# Patient Record
Sex: Female | Born: 1965 | Race: White | Hispanic: No | Smoking: Current every day smoker
Health system: Southern US, Community
[De-identification: ages and names within clinical notes are randomized; demographics above are authoritative.]

## PROBLEM LIST (undated history)

## (undated) DIAGNOSIS — R0789 Other chest pain: Secondary | ICD-10-CM

## (undated) DIAGNOSIS — F319 Bipolar disorder, unspecified: Secondary | ICD-10-CM

## (undated) DIAGNOSIS — M549 Dorsalgia, unspecified: Secondary | ICD-10-CM

## (undated) DIAGNOSIS — F29 Unspecified psychosis not due to a substance or known physiological condition: Secondary | ICD-10-CM

## (undated) DIAGNOSIS — I509 Heart failure, unspecified: Secondary | ICD-10-CM

## (undated) DIAGNOSIS — M797 Fibromyalgia: Secondary | ICD-10-CM

## (undated) DIAGNOSIS — F410 Panic disorder [episodic paroxysmal anxiety] without agoraphobia: Secondary | ICD-10-CM

## (undated) DIAGNOSIS — F419 Anxiety disorder, unspecified: Secondary | ICD-10-CM

## (undated) DIAGNOSIS — I10 Essential (primary) hypertension: Secondary | ICD-10-CM

## (undated) DIAGNOSIS — M25511 Pain in right shoulder: Secondary | ICD-10-CM

## (undated) DIAGNOSIS — G8929 Other chronic pain: Secondary | ICD-10-CM

## (undated) DIAGNOSIS — F259 Schizoaffective disorder, unspecified: Secondary | ICD-10-CM

## (undated) HISTORY — PX: MANDIBLE SURGERY: SHX707

## (undated) HISTORY — PX: CHOLECYSTECTOMY: SHX55

---

## 2000-07-27 ENCOUNTER — Inpatient Hospital Stay (HOSPITAL_COMMUNITY): Admission: EM | Admit: 2000-07-27 | Discharge: 2000-07-31 | Payer: Self-pay | Admitting: Psychiatry

## 2003-01-02 ENCOUNTER — Emergency Department (HOSPITAL_COMMUNITY): Admission: EM | Admit: 2003-01-02 | Discharge: 2003-01-03 | Payer: Self-pay | Admitting: Emergency Medicine

## 2003-01-19 ENCOUNTER — Ambulatory Visit (HOSPITAL_COMMUNITY): Admission: RE | Admit: 2003-01-19 | Discharge: 2003-01-19 | Payer: Self-pay | Admitting: *Deleted

## 2003-01-19 ENCOUNTER — Encounter: Payer: Self-pay | Admitting: *Deleted

## 2003-03-12 ENCOUNTER — Encounter: Payer: Self-pay | Admitting: Emergency Medicine

## 2003-03-12 ENCOUNTER — Emergency Department (HOSPITAL_COMMUNITY): Admission: EM | Admit: 2003-03-12 | Discharge: 2003-03-12 | Payer: Self-pay | Admitting: *Deleted

## 2003-03-20 ENCOUNTER — Ambulatory Visit (HOSPITAL_COMMUNITY): Admission: RE | Admit: 2003-03-20 | Discharge: 2003-03-20 | Payer: Self-pay | Admitting: Internal Medicine

## 2003-03-20 ENCOUNTER — Encounter: Payer: Self-pay | Admitting: Internal Medicine

## 2003-03-22 ENCOUNTER — Emergency Department (HOSPITAL_COMMUNITY): Admission: EM | Admit: 2003-03-22 | Discharge: 2003-03-22 | Payer: Self-pay | Admitting: Emergency Medicine

## 2003-04-18 ENCOUNTER — Encounter (INDEPENDENT_AMBULATORY_CARE_PROVIDER_SITE_OTHER): Payer: Self-pay | Admitting: Internal Medicine

## 2003-04-18 ENCOUNTER — Ambulatory Visit (HOSPITAL_COMMUNITY): Admission: RE | Admit: 2003-04-18 | Discharge: 2003-04-18 | Payer: Self-pay | Admitting: Internal Medicine

## 2003-05-15 ENCOUNTER — Encounter (HOSPITAL_COMMUNITY): Admission: RE | Admit: 2003-05-15 | Discharge: 2003-06-14 | Payer: Self-pay | Admitting: Oncology

## 2003-05-15 ENCOUNTER — Encounter: Admission: RE | Admit: 2003-05-15 | Discharge: 2003-05-15 | Payer: Self-pay | Admitting: Oncology

## 2003-08-30 ENCOUNTER — Ambulatory Visit (HOSPITAL_COMMUNITY): Admission: RE | Admit: 2003-08-30 | Discharge: 2003-08-30 | Payer: Self-pay | Admitting: Internal Medicine

## 2003-10-13 ENCOUNTER — Encounter (HOSPITAL_COMMUNITY): Admission: RE | Admit: 2003-10-13 | Discharge: 2003-11-12 | Payer: Self-pay | Admitting: Oncology

## 2003-10-13 ENCOUNTER — Encounter: Admission: RE | Admit: 2003-10-13 | Discharge: 2003-10-13 | Payer: Self-pay | Admitting: Oncology

## 2003-10-19 ENCOUNTER — Inpatient Hospital Stay (HOSPITAL_COMMUNITY): Admission: RE | Admit: 2003-10-19 | Discharge: 2003-10-25 | Payer: Self-pay | Admitting: Psychiatry

## 2003-11-07 ENCOUNTER — Emergency Department (HOSPITAL_COMMUNITY): Admission: EM | Admit: 2003-11-07 | Discharge: 2003-11-07 | Payer: Self-pay | Admitting: Emergency Medicine

## 2003-11-12 ENCOUNTER — Emergency Department (HOSPITAL_COMMUNITY): Admission: EM | Admit: 2003-11-12 | Discharge: 2003-11-12 | Payer: Self-pay | Admitting: Emergency Medicine

## 2003-11-12 ENCOUNTER — Emergency Department (HOSPITAL_COMMUNITY): Admission: EM | Admit: 2003-11-12 | Discharge: 2003-11-13 | Payer: Self-pay | Admitting: Emergency Medicine

## 2003-11-13 ENCOUNTER — Inpatient Hospital Stay (HOSPITAL_COMMUNITY): Admission: EM | Admit: 2003-11-13 | Discharge: 2003-11-16 | Payer: Self-pay | Admitting: Psychiatry

## 2003-11-19 ENCOUNTER — Emergency Department (HOSPITAL_COMMUNITY): Admission: EM | Admit: 2003-11-19 | Discharge: 2003-11-19 | Payer: Self-pay | Admitting: Emergency Medicine

## 2003-11-20 ENCOUNTER — Emergency Department (HOSPITAL_COMMUNITY): Admission: EM | Admit: 2003-11-20 | Discharge: 2003-11-20 | Payer: Self-pay | Admitting: Emergency Medicine

## 2003-11-21 ENCOUNTER — Emergency Department (HOSPITAL_COMMUNITY): Admission: EM | Admit: 2003-11-21 | Discharge: 2003-11-21 | Payer: Self-pay | Admitting: Emergency Medicine

## 2003-11-27 ENCOUNTER — Emergency Department (HOSPITAL_COMMUNITY): Admission: EM | Admit: 2003-11-27 | Discharge: 2003-11-27 | Payer: Self-pay | Admitting: Emergency Medicine

## 2003-11-29 ENCOUNTER — Emergency Department (HOSPITAL_COMMUNITY): Admission: EM | Admit: 2003-11-29 | Discharge: 2003-11-29 | Payer: Self-pay | Admitting: Emergency Medicine

## 2004-04-02 ENCOUNTER — Ambulatory Visit (HOSPITAL_COMMUNITY): Admission: RE | Admit: 2004-04-02 | Discharge: 2004-04-02 | Payer: Self-pay | Admitting: Family Medicine

## 2005-02-15 ENCOUNTER — Emergency Department (HOSPITAL_COMMUNITY): Admission: EM | Admit: 2005-02-15 | Discharge: 2005-02-15 | Payer: Self-pay | Admitting: Emergency Medicine

## 2005-02-17 ENCOUNTER — Inpatient Hospital Stay (HOSPITAL_COMMUNITY): Admission: EM | Admit: 2005-02-17 | Discharge: 2005-02-20 | Payer: Self-pay | Admitting: Psychiatry

## 2005-02-17 ENCOUNTER — Ambulatory Visit: Payer: Self-pay | Admitting: Psychiatry

## 2005-06-11 ENCOUNTER — Emergency Department (HOSPITAL_COMMUNITY): Admission: EM | Admit: 2005-06-11 | Discharge: 2005-06-11 | Payer: Self-pay | Admitting: Emergency Medicine

## 2005-07-09 ENCOUNTER — Emergency Department (HOSPITAL_COMMUNITY): Admission: EM | Admit: 2005-07-09 | Discharge: 2005-07-09 | Payer: Self-pay | Admitting: Emergency Medicine

## 2005-10-09 ENCOUNTER — Emergency Department (HOSPITAL_COMMUNITY): Admission: EM | Admit: 2005-10-09 | Discharge: 2005-10-09 | Payer: Self-pay | Admitting: Emergency Medicine

## 2006-03-20 ENCOUNTER — Emergency Department (HOSPITAL_COMMUNITY): Admission: EM | Admit: 2006-03-20 | Discharge: 2006-03-20 | Payer: Self-pay | Admitting: Emergency Medicine

## 2006-04-02 ENCOUNTER — Emergency Department (HOSPITAL_COMMUNITY): Admission: EM | Admit: 2006-04-02 | Discharge: 2006-04-02 | Payer: Self-pay | Admitting: Emergency Medicine

## 2006-04-03 ENCOUNTER — Ambulatory Visit (HOSPITAL_COMMUNITY): Admission: RE | Admit: 2006-04-03 | Discharge: 2006-04-03 | Payer: Self-pay | Admitting: Family Medicine

## 2006-04-17 ENCOUNTER — Emergency Department (HOSPITAL_COMMUNITY): Admission: EM | Admit: 2006-04-17 | Discharge: 2006-04-17 | Payer: Self-pay | Admitting: Emergency Medicine

## 2006-05-13 ENCOUNTER — Emergency Department (HOSPITAL_COMMUNITY): Admission: EM | Admit: 2006-05-13 | Discharge: 2006-05-13 | Payer: Self-pay | Admitting: Emergency Medicine

## 2006-05-26 ENCOUNTER — Emergency Department (HOSPITAL_COMMUNITY): Admission: EM | Admit: 2006-05-26 | Discharge: 2006-05-26 | Payer: Self-pay | Admitting: Emergency Medicine

## 2006-05-28 ENCOUNTER — Emergency Department (HOSPITAL_COMMUNITY): Admission: EM | Admit: 2006-05-28 | Discharge: 2006-05-28 | Payer: Self-pay | Admitting: Emergency Medicine

## 2006-06-04 ENCOUNTER — Emergency Department (HOSPITAL_COMMUNITY): Admission: EM | Admit: 2006-06-04 | Discharge: 2006-06-04 | Payer: Self-pay | Admitting: Emergency Medicine

## 2006-06-10 ENCOUNTER — Emergency Department (HOSPITAL_COMMUNITY): Admission: EM | Admit: 2006-06-10 | Discharge: 2006-06-10 | Payer: Self-pay | Admitting: Emergency Medicine

## 2006-07-02 ENCOUNTER — Ambulatory Visit: Payer: Self-pay | Admitting: Psychiatry

## 2006-07-02 ENCOUNTER — Inpatient Hospital Stay (HOSPITAL_COMMUNITY): Admission: EM | Admit: 2006-07-02 | Discharge: 2006-07-14 | Payer: Self-pay | Admitting: Psychiatry

## 2006-09-04 ENCOUNTER — Emergency Department (HOSPITAL_COMMUNITY): Admission: EM | Admit: 2006-09-04 | Discharge: 2006-09-04 | Payer: Self-pay | Admitting: Emergency Medicine

## 2006-12-10 ENCOUNTER — Emergency Department (HOSPITAL_COMMUNITY): Admission: EM | Admit: 2006-12-10 | Discharge: 2006-12-10 | Payer: Self-pay | Admitting: Emergency Medicine

## 2007-01-09 ENCOUNTER — Emergency Department (HOSPITAL_COMMUNITY): Admission: EM | Admit: 2007-01-09 | Discharge: 2007-01-10 | Payer: Self-pay | Admitting: Emergency Medicine

## 2007-02-03 ENCOUNTER — Emergency Department (HOSPITAL_COMMUNITY): Admission: EM | Admit: 2007-02-03 | Discharge: 2007-02-03 | Payer: Self-pay | Admitting: Emergency Medicine

## 2007-07-18 ENCOUNTER — Emergency Department (HOSPITAL_COMMUNITY): Admission: EM | Admit: 2007-07-18 | Discharge: 2007-07-18 | Payer: Self-pay | Admitting: Emergency Medicine

## 2007-10-01 ENCOUNTER — Emergency Department (HOSPITAL_COMMUNITY): Admission: EM | Admit: 2007-10-01 | Discharge: 2007-10-01 | Payer: Self-pay | Admitting: Emergency Medicine

## 2008-02-29 ENCOUNTER — Emergency Department (HOSPITAL_COMMUNITY): Admission: EM | Admit: 2008-02-29 | Discharge: 2008-02-29 | Payer: Self-pay | Admitting: Emergency Medicine

## 2008-04-04 ENCOUNTER — Ambulatory Visit: Payer: Self-pay | Admitting: Internal Medicine

## 2008-04-20 ENCOUNTER — Encounter: Payer: Self-pay | Admitting: Internal Medicine

## 2008-04-20 ENCOUNTER — Ambulatory Visit (HOSPITAL_COMMUNITY): Admission: RE | Admit: 2008-04-20 | Discharge: 2008-04-20 | Payer: Self-pay | Admitting: Internal Medicine

## 2008-04-20 ENCOUNTER — Ambulatory Visit: Payer: Self-pay | Admitting: Internal Medicine

## 2008-06-14 ENCOUNTER — Emergency Department (HOSPITAL_COMMUNITY): Admission: EM | Admit: 2008-06-14 | Discharge: 2008-06-14 | Payer: Self-pay | Admitting: Orthopedic Surgery

## 2008-06-16 ENCOUNTER — Emergency Department (HOSPITAL_COMMUNITY): Admission: EM | Admit: 2008-06-16 | Discharge: 2008-06-16 | Payer: Self-pay | Admitting: Emergency Medicine

## 2008-06-21 ENCOUNTER — Other Ambulatory Visit: Payer: Self-pay | Admitting: Emergency Medicine

## 2008-06-22 ENCOUNTER — Ambulatory Visit: Payer: Self-pay | Admitting: *Deleted

## 2008-06-22 ENCOUNTER — Inpatient Hospital Stay (HOSPITAL_COMMUNITY): Admission: EM | Admit: 2008-06-22 | Discharge: 2008-06-26 | Payer: Self-pay | Admitting: *Deleted

## 2008-07-17 ENCOUNTER — Emergency Department (HOSPITAL_COMMUNITY): Admission: EM | Admit: 2008-07-17 | Discharge: 2008-07-17 | Payer: Self-pay | Admitting: Emergency Medicine

## 2008-08-13 ENCOUNTER — Emergency Department (HOSPITAL_COMMUNITY): Admission: EM | Admit: 2008-08-13 | Discharge: 2008-08-13 | Payer: Self-pay | Admitting: Emergency Medicine

## 2008-08-24 ENCOUNTER — Emergency Department (HOSPITAL_COMMUNITY): Admission: EM | Admit: 2008-08-24 | Discharge: 2008-08-24 | Payer: Self-pay | Admitting: Emergency Medicine

## 2008-10-16 ENCOUNTER — Ambulatory Visit: Payer: Self-pay | Admitting: Psychiatry

## 2008-10-16 ENCOUNTER — Other Ambulatory Visit: Payer: Self-pay | Admitting: Emergency Medicine

## 2008-10-16 ENCOUNTER — Inpatient Hospital Stay (HOSPITAL_COMMUNITY): Admission: AD | Admit: 2008-10-16 | Discharge: 2008-10-20 | Payer: Self-pay | Admitting: Psychiatry

## 2008-12-08 ENCOUNTER — Emergency Department (HOSPITAL_COMMUNITY): Admission: EM | Admit: 2008-12-08 | Discharge: 2008-12-08 | Payer: Self-pay | Admitting: Emergency Medicine

## 2008-12-12 ENCOUNTER — Emergency Department (HOSPITAL_COMMUNITY): Admission: EM | Admit: 2008-12-12 | Discharge: 2008-12-12 | Payer: Self-pay | Admitting: Emergency Medicine

## 2008-12-15 ENCOUNTER — Emergency Department (HOSPITAL_COMMUNITY): Admission: EM | Admit: 2008-12-15 | Discharge: 2008-12-15 | Payer: Self-pay | Admitting: Emergency Medicine

## 2008-12-25 ENCOUNTER — Emergency Department (HOSPITAL_COMMUNITY): Admission: EM | Admit: 2008-12-25 | Discharge: 2008-12-25 | Payer: Self-pay | Admitting: Emergency Medicine

## 2008-12-28 ENCOUNTER — Emergency Department (HOSPITAL_COMMUNITY): Admission: EM | Admit: 2008-12-28 | Discharge: 2008-12-28 | Payer: Self-pay | Admitting: Emergency Medicine

## 2009-01-18 ENCOUNTER — Emergency Department (HOSPITAL_COMMUNITY): Admission: EM | Admit: 2009-01-18 | Discharge: 2009-01-18 | Payer: Self-pay | Admitting: Emergency Medicine

## 2009-01-22 ENCOUNTER — Emergency Department (HOSPITAL_COMMUNITY): Admission: EM | Admit: 2009-01-22 | Discharge: 2009-01-22 | Payer: Self-pay | Admitting: Emergency Medicine

## 2009-01-25 ENCOUNTER — Emergency Department (HOSPITAL_COMMUNITY): Admission: EM | Admit: 2009-01-25 | Discharge: 2009-01-25 | Payer: Self-pay | Admitting: Emergency Medicine

## 2009-01-26 ENCOUNTER — Emergency Department (HOSPITAL_COMMUNITY): Admission: EM | Admit: 2009-01-26 | Discharge: 2009-01-26 | Payer: Self-pay | Admitting: Emergency Medicine

## 2009-02-05 ENCOUNTER — Emergency Department (HOSPITAL_COMMUNITY): Admission: EM | Admit: 2009-02-05 | Discharge: 2009-02-05 | Payer: Self-pay | Admitting: Emergency Medicine

## 2009-02-05 ENCOUNTER — Encounter (INDEPENDENT_AMBULATORY_CARE_PROVIDER_SITE_OTHER): Payer: Self-pay | Admitting: *Deleted

## 2009-02-23 ENCOUNTER — Encounter (INDEPENDENT_AMBULATORY_CARE_PROVIDER_SITE_OTHER): Payer: Self-pay | Admitting: *Deleted

## 2009-03-01 DIAGNOSIS — Z9189 Other specified personal risk factors, not elsewhere classified: Secondary | ICD-10-CM

## 2009-03-01 DIAGNOSIS — IMO0001 Reserved for inherently not codable concepts without codable children: Secondary | ICD-10-CM

## 2009-03-01 DIAGNOSIS — F1210 Cannabis abuse, uncomplicated: Secondary | ICD-10-CM | POA: Insufficient documentation

## 2009-03-01 DIAGNOSIS — F319 Bipolar disorder, unspecified: Secondary | ICD-10-CM

## 2009-03-01 DIAGNOSIS — F17200 Nicotine dependence, unspecified, uncomplicated: Secondary | ICD-10-CM

## 2009-03-01 DIAGNOSIS — E119 Type 2 diabetes mellitus without complications: Secondary | ICD-10-CM

## 2009-03-01 DIAGNOSIS — J45909 Unspecified asthma, uncomplicated: Secondary | ICD-10-CM | POA: Insufficient documentation

## 2009-03-02 ENCOUNTER — Encounter (INDEPENDENT_AMBULATORY_CARE_PROVIDER_SITE_OTHER): Payer: Self-pay | Admitting: *Deleted

## 2009-03-03 ENCOUNTER — Emergency Department (HOSPITAL_COMMUNITY): Admission: EM | Admit: 2009-03-03 | Discharge: 2009-03-03 | Payer: Self-pay | Admitting: Emergency Medicine

## 2009-03-04 ENCOUNTER — Emergency Department (HOSPITAL_COMMUNITY): Admission: EM | Admit: 2009-03-04 | Discharge: 2009-03-04 | Payer: Self-pay | Admitting: Emergency Medicine

## 2009-03-06 ENCOUNTER — Emergency Department (HOSPITAL_COMMUNITY): Admission: EM | Admit: 2009-03-06 | Discharge: 2009-03-06 | Payer: Self-pay | Admitting: Emergency Medicine

## 2009-03-07 ENCOUNTER — Other Ambulatory Visit: Payer: Self-pay | Admitting: Emergency Medicine

## 2009-03-07 ENCOUNTER — Inpatient Hospital Stay (HOSPITAL_COMMUNITY): Admission: RE | Admit: 2009-03-07 | Discharge: 2009-03-12 | Payer: Self-pay | Admitting: Psychiatry

## 2009-03-07 ENCOUNTER — Ambulatory Visit: Payer: Self-pay | Admitting: Psychiatry

## 2009-03-18 ENCOUNTER — Emergency Department (HOSPITAL_COMMUNITY): Admission: EM | Admit: 2009-03-18 | Discharge: 2009-03-18 | Payer: Self-pay | Admitting: Emergency Medicine

## 2009-03-26 ENCOUNTER — Emergency Department (HOSPITAL_COMMUNITY): Admission: EM | Admit: 2009-03-26 | Discharge: 2009-03-26 | Payer: Self-pay | Admitting: Emergency Medicine

## 2009-05-14 ENCOUNTER — Emergency Department (HOSPITAL_COMMUNITY): Admission: EM | Admit: 2009-05-14 | Discharge: 2009-05-14 | Payer: Self-pay | Admitting: Emergency Medicine

## 2009-06-26 ENCOUNTER — Emergency Department (HOSPITAL_COMMUNITY): Admission: EM | Admit: 2009-06-26 | Discharge: 2009-06-26 | Payer: Self-pay | Admitting: Emergency Medicine

## 2009-07-14 ENCOUNTER — Emergency Department (HOSPITAL_COMMUNITY): Admission: EM | Admit: 2009-07-14 | Discharge: 2009-07-14 | Payer: Self-pay | Admitting: Emergency Medicine

## 2010-08-17 ENCOUNTER — Encounter: Payer: Self-pay | Admitting: Internal Medicine

## 2010-08-17 ENCOUNTER — Encounter (HOSPITAL_COMMUNITY): Payer: Self-pay | Admitting: Oncology

## 2010-08-18 ENCOUNTER — Encounter: Payer: Self-pay | Admitting: Family Medicine

## 2010-10-28 LAB — BASIC METABOLIC PANEL
Calcium: 9 mg/dL (ref 8.4–10.5)
Creatinine, Ser: 0.58 mg/dL (ref 0.4–1.2)

## 2010-10-28 LAB — POCT CARDIAC MARKERS
CKMB, poc: <1 ng/mL — ABNORMAL LOW (ref 1.0–8.0)
Myoglobin, poc: 143 ng/mL (ref 12–200)
Troponin i, poc: <0.05 ng/mL (ref 0.00–0.09)
Troponin i, poc: <0.05 ng/mL (ref 0.00–0.09)

## 2010-10-28 LAB — URINALYSIS, ROUTINE W REFLEX MICROSCOPIC
Hgb urine dipstick: NEGATIVE
Ketones, ur: NEGATIVE mg/dL
Nitrite: NEGATIVE
Protein, ur: NEGATIVE mg/dL

## 2010-10-28 LAB — DIFFERENTIAL
Basophils Relative: 0 % (ref 0–1)
Eosinophils Absolute: 0.1 10*3/uL (ref 0.0–0.7)
Eosinophils Relative: 1 % (ref 0–5)
Lymphs Abs: 1.8 10*3/uL (ref 0.7–4.0)
Monocytes Absolute: 0.3 10*3/uL (ref 0.1–1.0)
Monocytes Relative: 3 % (ref 3–12)

## 2010-10-28 LAB — CBC
HCT: 40.2 % (ref 36.0–46.0)
MCHC: 34.7 g/dL (ref 30.0–36.0)
MCV: 96.6 fL (ref 78.0–100.0)
RBC: 4.16 MIL/uL (ref 3.87–5.11)
WBC: 8.9 10*3/uL (ref 4.0–10.5)

## 2010-10-31 LAB — POCT CARDIAC MARKERS
Myoglobin, poc: 53.6 ng/mL (ref 12–200)
Troponin i, poc: <0.05 ng/mL (ref 0.00–0.09)

## 2010-10-31 LAB — BASIC METABOLIC PANEL
BUN: 4 mg/dL — ABNORMAL LOW (ref 6–23)
Calcium: 8.8 mg/dL (ref 8.4–10.5)
Chloride: 104 mEq/L (ref 96–112)
Creatinine, Ser: 0.57 mg/dL (ref 0.4–1.2)
GFR calc Af Amer: >60 mL/min (ref >60)
GFR calc non Af Amer: >60 mL/min (ref >60)

## 2010-11-02 LAB — DIFFERENTIAL
Basophils Absolute: 0 K/uL (ref 0.0–0.1)
Basophils Absolute: 0 10*3/uL (ref 0.0–0.1)
Basophils Relative: 0 % (ref 0–1)
Basophils Relative: 0 % (ref 0–1)
Basophils Relative: 0 % (ref 0–1)
Eosinophils Absolute: 0.1 10*3/uL (ref 0.0–0.7)
Eosinophils Absolute: 0.1 10*3/uL (ref 0.0–0.7)
Eosinophils Relative: 1 % (ref 0–5)
Eosinophils Relative: 2 % (ref 0–5)
Lymphocytes Relative: 28 % (ref 12–46)
Lymphs Abs: 1.6 10*3/uL (ref 0.7–4.0)
Monocytes Absolute: 0.3 K/uL (ref 0.1–1.0)
Monocytes Absolute: 0.4 10*3/uL (ref 0.1–1.0)
Monocytes Relative: 5 % (ref 3–12)
Monocytes Relative: 7 % (ref 3–12)
Neutro Abs: 3.7 K/uL (ref 1.7–7.7)
Neutrophils Relative %: 65 % (ref 43–77)
Neutrophils Relative %: 55 % (ref 43–77)

## 2010-11-02 LAB — URINALYSIS, ROUTINE W REFLEX MICROSCOPIC
Bilirubin Urine: NEGATIVE
Bilirubin Urine: NEGATIVE
Bilirubin Urine: NEGATIVE
Glucose, UA: NEGATIVE mg/dL
Glucose, UA: NEGATIVE mg/dL
Hgb urine dipstick: NEGATIVE
Hgb urine dipstick: NEGATIVE
Ketones, ur: NEGATIVE mg/dL
Ketones, ur: NEGATIVE mg/dL
Leukocytes, UA: NEGATIVE
Leukocytes, UA: NEGATIVE
Nitrite: NEGATIVE
Nitrite: NEGATIVE
Nitrite: NEGATIVE
Protein, ur: NEGATIVE mg/dL
Specific Gravity, Urine: 1.005 (ref 1.005–1.030)
Specific Gravity, Urine: 1.01 (ref 1.005–1.030)
Specific Gravity, Urine: >1.03 — ABNORMAL HIGH (ref 1.005–1.030)
Urobilinogen, UA: 0.2 mg/dL (ref 0.0–1.0)
Urobilinogen, UA: 0.2 mg/dL (ref 0.0–1.0)
Urobilinogen, UA: 1 mg/dL (ref 0.0–1.0)
pH: 7 (ref 5.0–8.0)
pH: 6 (ref 5.0–8.0)

## 2010-11-02 LAB — RAPID URINE DRUG SCREEN, HOSP PERFORMED: Barbiturates: NOT DETECTED

## 2010-11-02 LAB — CBC
HCT: 37.3 % (ref 36.0–46.0)
HCT: 34.9 % — ABNORMAL LOW (ref 36.0–46.0)
HCT: 35.5 % — ABNORMAL LOW (ref 36.0–46.0)
Hemoglobin: 13.4 g/dL (ref 12.0–15.0)
Hemoglobin: 12.6 g/dL (ref 12.0–15.0)
MCHC: 35.8 g/dL (ref 30.0–36.0)
MCHC: 35.4 g/dL (ref 30.0–36.0)
MCHC: 35.5 g/dL (ref 30.0–36.0)
MCV: 96.3 fL (ref 78.0–100.0)
MCV: 96.4 fL (ref 78.0–100.0)
MCV: 96.4 fL (ref 78.0–100.0)
Platelets: 139 10*3/uL — ABNORMAL LOW (ref 150–400)
RBC: 3.87 MIL/uL (ref 3.87–5.11)
RBC: 3.62 MIL/uL — ABNORMAL LOW (ref 3.87–5.11)
RDW: 13.3 % (ref 11.5–15.5)
RDW: 13.6 % (ref 11.5–15.5)
WBC: 5.7 10*3/uL (ref 4.0–10.5)
WBC: 6.4 10*3/uL (ref 4.0–10.5)

## 2010-11-02 LAB — COMPREHENSIVE METABOLIC PANEL
ALT: 23 U/L (ref 0–35)
ALT: 18 U/L (ref 0–35)
AST: 30 U/L (ref 0–37)
Albumin: 3.5 g/dL (ref 3.5–5.2)
Alkaline Phosphatase: 66 U/L (ref 39–117)
CO2: 28 mEq/L (ref 19–32)
Calcium: 8.4 mg/dL (ref 8.4–10.5)
Chloride: 105 mEq/L (ref 96–112)
Creatinine, Ser: 0.56 mg/dL (ref 0.4–1.2)
GFR calc Af Amer: >60 mL/min (ref >60)
GFR calc non Af Amer: >60 mL/min (ref >60)
Glucose, Bld: 103 mg/dL — ABNORMAL HIGH (ref 70–99)
Potassium: 3.3 mEq/L — ABNORMAL LOW (ref 3.5–5.1)
Sodium: 139 mEq/L (ref 135–145)
Sodium: 137 mEq/L (ref 135–145)
Total Bilirubin: 0.4 mg/dL (ref 0.3–1.2)
Total Protein: 6.9 g/dL (ref 6.0–8.3)
Total Protein: 6.6 g/dL (ref 6.0–8.3)

## 2010-11-02 LAB — PREGNANCY, URINE
Preg Test, Ur: NEGATIVE
Preg Test, Ur: NEGATIVE
Preg Test, Ur: NEGATIVE
Preg Test, Ur: NEGATIVE

## 2010-11-02 LAB — LIPASE, BLOOD: Lipase: 22 U/L (ref 11–59)

## 2010-11-02 LAB — COMPREHENSIVE METABOLIC PANEL WITH GFR
Alkaline Phosphatase: 55 U/L (ref 39–117)
BUN: 2 mg/dL — ABNORMAL LOW (ref 6–23)
CO2: 24 meq/L (ref 19–32)
Chloride: 109 meq/L (ref 96–112)
GFR calc non Af Amer: >60 mL/min (ref >60)
Glucose, Bld: 115 mg/dL — ABNORMAL HIGH (ref 70–99)
Potassium: 3.1 meq/L — ABNORMAL LOW (ref 3.5–5.1)
Total Bilirubin: 0.9 mg/dL (ref 0.3–1.2)

## 2010-11-02 LAB — BASIC METABOLIC PANEL
CO2: 25 mEq/L (ref 19–32)
Calcium: 8.7 mg/dL (ref 8.4–10.5)
Glucose, Bld: 142 mg/dL — ABNORMAL HIGH (ref 70–99)
Potassium: 3.3 mEq/L — ABNORMAL LOW (ref 3.5–5.1)
Sodium: 138 mEq/L (ref 135–145)

## 2010-11-02 LAB — URINE MICROSCOPIC-ADD ON

## 2010-11-02 LAB — POCT PREGNANCY, URINE: Preg Test, Ur: NEGATIVE

## 2010-11-02 LAB — URINE CULTURE

## 2010-11-02 LAB — ETHANOL: Alcohol, Ethyl (B): <5 mg/dL (ref 0–10)

## 2010-11-03 LAB — DIFFERENTIAL
Basophils Absolute: 0 10*3/uL (ref 0.0–0.1)
Eosinophils Relative: 0 % (ref 0–5)
Lymphocytes Relative: 20 % (ref 12–46)
Lymphs Abs: 1.3 10*3/uL (ref 0.7–4.0)
Neutro Abs: 4.9 10*3/uL (ref 1.7–7.7)

## 2010-11-03 LAB — POCT I-STAT, CHEM 8
BUN: 4 mg/dL — ABNORMAL LOW (ref 6–23)
Chloride: 110 mEq/L (ref 96–112)
Creatinine, Ser: 0.6 mg/dL (ref 0.4–1.2)
Potassium: 3.8 mEq/L (ref 3.5–5.1)
Sodium: 137 mEq/L (ref 135–145)
TCO2: 19 mmol/L (ref 0–100)

## 2010-11-03 LAB — URINALYSIS, ROUTINE W REFLEX MICROSCOPIC
Ketones, ur: NEGATIVE mg/dL
Leukocytes, UA: NEGATIVE
Nitrite: NEGATIVE
Specific Gravity, Urine: 1.01 (ref 1.005–1.030)
Urobilinogen, UA: 0.2 mg/dL (ref 0.0–1.0)
pH: 7 (ref 5.0–8.0)

## 2010-11-03 LAB — URINE MICROSCOPIC-ADD ON

## 2010-11-03 LAB — CBC
HCT: 37.7 % (ref 36.0–46.0)
Platelets: 162 10*3/uL (ref 150–400)
WBC: 6.4 10*3/uL (ref 4.0–10.5)

## 2010-11-03 LAB — PREGNANCY, URINE
Preg Test, Ur: NEGATIVE
Preg Test, Ur: NEGATIVE

## 2010-11-03 LAB — ETHANOL: Alcohol, Ethyl (B): <5 mg/dL (ref 0–10)

## 2010-11-03 LAB — RAPID URINE DRUG SCREEN, HOSP PERFORMED
Benzodiazepines: POSITIVE — AB
Tetrahydrocannabinol: POSITIVE — AB

## 2010-11-04 LAB — URINALYSIS, ROUTINE W REFLEX MICROSCOPIC
Nitrite: NEGATIVE
Specific Gravity, Urine: 1.02 (ref 1.005–1.030)
Urobilinogen, UA: 0.2 mg/dL (ref 0.0–1.0)
pH: 6 (ref 5.0–8.0)

## 2010-11-04 LAB — CBC
HCT: 35.9 % — ABNORMAL LOW (ref 36.0–46.0)
Hemoglobin: 12.9 g/dL (ref 12.0–15.0)
Platelets: 103 10*3/uL — ABNORMAL LOW (ref 150–400)
RDW: 13 % (ref 11.5–15.5)
WBC: 5.7 10*3/uL (ref 4.0–10.5)

## 2010-11-04 LAB — BASIC METABOLIC PANEL
Calcium: 8.8 mg/dL (ref 8.4–10.5)
GFR calc Af Amer: >60 mL/min (ref >60)
GFR calc non Af Amer: >60 mL/min (ref >60)
Potassium: 3.5 mEq/L (ref 3.5–5.1)
Sodium: 137 mEq/L (ref 135–145)

## 2010-11-04 LAB — DIFFERENTIAL
Basophils Absolute: 0.1 10*3/uL (ref 0.0–0.1)
Eosinophils Relative: 2 % (ref 0–5)
Lymphocytes Relative: 34 % (ref 12–46)
Lymphs Abs: 1.9 10*3/uL (ref 0.7–4.0)
Monocytes Absolute: 0.3 10*3/uL (ref 0.1–1.0)
Neutro Abs: 3.2 10*3/uL (ref 1.7–7.7)

## 2010-11-04 LAB — PREGNANCY, URINE: Preg Test, Ur: NEGATIVE

## 2010-11-05 LAB — URINALYSIS, ROUTINE W REFLEX MICROSCOPIC
Glucose, UA: NEGATIVE mg/dL
Hgb urine dipstick: NEGATIVE
Hgb urine dipstick: NEGATIVE
Ketones, ur: NEGATIVE mg/dL
Protein, ur: NEGATIVE mg/dL
Protein, ur: NEGATIVE mg/dL
Specific Gravity, Urine: 1.01 (ref 1.005–1.030)
Urobilinogen, UA: 0.2 mg/dL (ref 0.0–1.0)

## 2010-11-05 LAB — PREGNANCY, URINE
Preg Test, Ur: NEGATIVE
Preg Test, Ur: NEGATIVE

## 2010-11-07 LAB — RAPID URINE DRUG SCREEN, HOSP PERFORMED
Barbiturates: NOT DETECTED
Cocaine: POSITIVE — AB
Opiates: POSITIVE — AB

## 2010-11-07 LAB — URINALYSIS, ROUTINE W REFLEX MICROSCOPIC
Glucose, UA: NEGATIVE mg/dL
Glucose, UA: NEGATIVE mg/dL
Hgb urine dipstick: NEGATIVE
Hgb urine dipstick: NEGATIVE
Nitrite: NEGATIVE
pH: 6.5 (ref 5.0–8.0)
pH: 6 (ref 5.0–8.0)

## 2010-11-07 LAB — DIFFERENTIAL
Basophils Absolute: 0 10*3/uL (ref 0.0–0.1)
Basophils Relative: 0 % (ref 0–1)
Basophils Relative: 1 % (ref 0–1)
Eosinophils Absolute: 0.1 10*3/uL (ref 0.0–0.7)
Lymphocytes Relative: 38 % (ref 12–46)
Lymphs Abs: 1.6 10*3/uL (ref 0.7–4.0)
Monocytes Absolute: 0.4 10*3/uL (ref 0.1–1.0)
Monocytes Absolute: 0.2 10*3/uL (ref 0.1–1.0)
Monocytes Relative: 4 % (ref 3–12)
Neutro Abs: 2.6 10*3/uL (ref 1.7–7.7)
Neutrophils Relative %: 71 % (ref 43–77)
Neutrophils Relative %: 56 % (ref 43–77)

## 2010-11-07 LAB — BASIC METABOLIC PANEL
Calcium: 9.1 mg/dL (ref 8.4–10.5)
Creatinine, Ser: 0.64 mg/dL (ref 0.4–1.2)
GFR calc Af Amer: >60 mL/min (ref >60)
GFR calc non Af Amer: >60 mL/min (ref >60)
Sodium: 139 mEq/L (ref 135–145)

## 2010-11-07 LAB — COMPREHENSIVE METABOLIC PANEL
ALT: 27 U/L (ref 0–35)
Alkaline Phosphatase: 67 U/L (ref 39–117)
CO2: 27 mEq/L (ref 19–32)
Glucose, Bld: 93 mg/dL (ref 70–99)
Potassium: 3.4 mEq/L — ABNORMAL LOW (ref 3.5–5.1)
Sodium: 138 mEq/L (ref 135–145)
Total Protein: 6.4 g/dL (ref 6.0–8.3)

## 2010-11-07 LAB — URINE CULTURE: Colony Count: 85000

## 2010-11-07 LAB — CBC
Hemoglobin: 13.9 g/dL (ref 12.0–15.0)
Hemoglobin: 14.7 g/dL (ref 12.0–15.0)
RBC: 4.14 MIL/uL (ref 3.87–5.11)
RBC: 4.33 MIL/uL (ref 3.87–5.11)
RDW: 13.2 % (ref 11.5–15.5)
RDW: 13.5 % (ref 11.5–15.5)

## 2010-11-07 LAB — URINE MICROSCOPIC-ADD ON

## 2010-11-07 LAB — PREGNANCY, URINE: Preg Test, Ur: NEGATIVE

## 2010-11-07 LAB — ETHANOL: Alcohol, Ethyl (B): <5 mg/dL (ref 0–10)

## 2010-12-10 NOTE — Op Note (Signed)
[name], [name]             ACCOUNT NO.:  [id number]   MEDICAL RECORD NO.:  [id number]          PATIENT TYPE:  AMB   LOCATION:  DAY                           FACILITY:  [facility]   PHYSICIAN:  R. Roetta Sessions, M.D. DATE OF BIRTH:  Aug 06, 1965   DATE OF PROCEDURE:  04/20/2008  DATE OF DISCHARGE:                               OPERATIVE REPORT   Esophagogastroduodenoscopy with Elease Hashimoto dilation, followed by gastric  small bowel biopsy, followed by ileal colonoscopy diagnostic.   INDICATIONS FOR PROCEDURE:  A 45 year old lady with chronic abdominal  pain, nausea, vomiting, constipation, intermittent hematochezia,  dysphagia, and question of collateral mesenteric blood vessels on recent  CT, otherwise, unremarkable imaging study.  EGD and colonoscopy now  being performed.  Risks, benefits, and alternatives have been reviewed,  questions answered.  Please see documentation in the medical record.  Because of her polypharmacy and history of substance abuse, she is being  done under propofol.  Today, she has a negative urine drug screen for  cocaine.   PROCEDURE NOTE:  O2 saturation, blood pressure, pulse, respirations were  monitored throughout the entire procedure.   CONSCIOUS SEDATION:  Propofol administered by Anesthesia.  Cetacaine  spray for topical pharyngeal anesthesia.   INSTRUMENT:  Pentax video chip system.   FINDINGS:  EGD examination of the tubular esophagus revealed  circumferential distal esophageal erosions straddling the EG junction,  superimposed Schatzki ring, otherwise, esophageal mucosa appeared  unremarkable.  EG junction easily traversed.  Stomach:  Gastric cavity  was emptied and insufflated well with air.  Thorough examination of the  gastric mucosa including retroflexed view of the proximal stomach  esophagogastric junction demonstrated small hiatal hernia and diffuse  submucosal petechiae.  No ulcer or infiltrating process.   Pylorus was patent, easily  traversed.  Examination of the bulb and  second portion revealed questionable subtle scalloping of the mucosa of  the duodenum and second portion, otherwise, unremarkable duodenal  mucosa.   THERAPEUTIC/DIAGNOSTIC MANEUVERS PERFORMED:  The scope was withdrawn.  A  56-French Maloney dilator was passed to full insertion with ease.  A  look back revealed no apparent complication related to passage of the  dilator.  Subsequent biopsies of the second and third portion of the  duodenum were taken for histologic study.  Subsequent biopsies of the  antrum and gastric body were taken.  The patient tolerated the procedure  well and was prepared for colonoscopy.  Digital rectal exam revealed no  abnormalities.  Endoscopic findings:  The prep was suboptimal, but  doable.  Colon:  Colonic mucosa was surveyed from the rectosigmoid  junction through the left transverse, right colon, appendiceal orifice,  ileocecal valve, and cecum.  These structures were well seen and  photographed for the record.  Terminal ileum was embedded to 10-cm from  this level.  The scope was slowly and cautiously withdrawn.  All  previous mentioned mucosal surfaces were again seen.  The patient had  normal-appearing colonic mucosa as well as terminal ileal mucosa.  Thorough examination of the rectal mucosa including retroflex view of  the anal verge demonstrated anal papilla and  internal hemorrhoids only.  The patient tolerated the procedure and was reacted to endoscopy.   IMPRESSION:  1. Esophagogastroduodenoscopy:  Circumferential distal esophageal      erosions consistent with mild erosive reflux esophagitis.  2. Schatzki ring, status post dilation.  3. Small hiatal hernia.  4. Diffuse submucosal gastric petechiae, otherwise, unremarkable      gastric mucosa, status post biopsy, questionable scalloping of the      second portion of the duodenal mucosa.  This may be a normal      variant, status post biopsy.    COLONOSCOPY FINDINGS:  Anal canal, internal hemorrhoids, anal papilla,  otherwise, normal rectum and colon.  Terminal ileum, I suspect the  patient has bled from hemorrhoids.  The patient has reflux esophagitis  and underlying gastritis.   RECOMMENDATIONS:  1. Begin Aciphex 20 mg orally daily.  2. Antireflux literature provided to Ms. Weiland.  3. Followup on path.  4. Literature on constipation and hemorrhoids were provided to Ms.      Marcon.  5. MiraLax 70 g orally at bedtime p.r.n. constipation, a 10-day course      of Anusol-HC Suppository 1 per rectum at bedtime.   Followup appointment with [place] in 26-month.       Jonathon Bellows, M.D.  Electronically Signed     RMR/MEDQ  D:  04/20/2008  T:  04/20/2008  Job:  [id number]   cc:   Mila Homer. Sudie Bailey, M.D.  Fax: [phone]

## 2010-12-10 NOTE — H&P (Signed)
[name]             ACCOUNT NO.:  [id number]   MEDICAL RECORD NO.:  [id number]          PATIENT TYPE:  IPS   LOCATION:  0300                          FACILITY[facility]  [facility]   PHYSICIAN:  Jasmine Pang, M.D. DATE OF BIRTH:  [birth year]   DATE OF ADMISSION:  03/07/2009  DATE OF DISCHARGE:                       PSYCHIATRIC ADMISSION ASSESSMENT   HISTORY OF PRESENT ILLNESS:  The patient presents after being assessed  in the emergency department with a history of delusional thinking,  believes she is pregnant x4 months, although she has been in the ER for  several visits with a negative pregnancy test.  She had stopped taking  bipolar medications because she does not feel that she needs them  anymore.  She denies any history of substance use.  Denies any other  specific stressors.  She also believes that she is in a relationship  with a Emergency planning/management officer who she communicates with through her computer and  radio.   PAST PSYCHIATRIC HISTORY:  The patient has had several admissions to our  facility.  She was here in March 2010 for depression and cocaine use,  history of bipolar disorder.  She is a client at [facility].   SOCIAL HISTORY:  The patient lives in [place], apparently lives with  her boyfriend.  The patient is unemployed.   FAMILY HISTORY:  Unknown.   ALCOHOL AND DRUG HISTORY:  Patient denies any alcohol or substance use.   PRIMARY CARE [name]:  Dr. Sudie Bailey   MEDICAL PROBLEMS:  Currently has a urinary tract infection and is being  treated with Cipro.  No other known medical conditions.   MEDICATIONS:  1. The patient has been taking a prenatal vitamin.  2. Gabapentin 600 mg twice a day.  3. Klonopin 1 mg at bedtime.  4. Naprosyn 500 mg b.i.d.  5. Again, the prescription for Cipro for 7-day course.   DRUG ALLERGIES:  NOVOCAIN.   PHYSICAL EXAMINATION:  GENERAL:  This is an overweight, short-statured  female, unkempt, and again was assessed at [facility].   It was  noted that the patient is delusional.   LABORATORY DATA:  Shows a BMET within normal limits.  Alcohol level less  than 5, platelet count of 135.  Urine pregnancy test was negative.   MENTAL STATUS EXAMINATION:  The patient is fully alert and cooperative,  good eye contact.  Again, she is somewhat unkempt, steadfast about being  pregnant approximately 4 months.  Her speech is somewhat rapid and  difficult to understand.  Thought processes:  Again, delusional  thinking.  Denies any suicidal thoughts, pleasant and agreeable to  recommendations of medications.  Cognitive function intact.  Her memory  is fair.  Judgment and insight are minimal.   DIAGNOSES:  AXIS I:.  Delusional disorder.  AXIS II:  Deferred.  AXIS III:  Urinary tract infection.  AXIS IV:  Deferred at this time.  AXIS V:  Current is 30.   PLAN:  Continue to treat her urinary tract infection with Cipro.  Will  have Zyprexa available for psychotic symptoms.  We will continue with  her Klonopin and gabapentin.  The patient will be in the yellow group.  Will continue to assess her other comorbidities and her support.  Tentative length of stay at this time is 4-5 days.      Landry Corporal, N.P.      Jasmine Pang, M.D.  Electronically Signed    JO/MEDQ  D:  03/08/2009  T:  03/08/2009  Job:  [id number]

## 2010-12-10 NOTE — Discharge Summary (Signed)
NAMEBARI, LEIB             ACCOUNT NO.:  [id number]   MEDICAL RECORD NO.:  [id number]          PATIENT TYPE:  IPS   LOCATION:  0304                          [facility]ILITY[facility]   PHYSICIAN:  Jasmine Pang, M.D. DATE OF BIRTH:  Sep 12, 1965   DATE OF ADMISSION:  06/22/2008  DATE OF DISCHARGE:  06/26/2008                               DISCHARGE SUMMARY   IDENTIFICATION:  This is a 45 year old married white female who was  admitted on involuntary papers on June 21, 2008.   HISTORY OF PRESENT ILLNESS:  The patient was admitted due to secondary  to delusional thinking that she is pregnant beside despite 2 negative  pregnancy tests in the emergency room.  As her chart, there were  multiple visits to the ER in the past few weeks.  She believes she has  had a sexual affair with a Emergency planning/management officer, who got her pregnant.  She  also feels her husband had sex with the dog and dog is now pregnant.  In  the ER, she became agitated, loud crying, and screaming and agitated  when told she was not pregnant.  She has a long history of psychiatric  illness, last admitted to [facility] in December 2007 and then transferred to  [facility] and then upon discharge.  She had a follow up with  [facility], but stopped 1 year ago and  currently is not compliant with her antipsychotic.   PAST PSYCHIATRIC HISTORY:  As above, multiple inpatient admissions.   MEDICAL HISTORY:  Chronic back pain and bronchitis.   SUBSTANCE ABUSE:  THC.   MEDICATIONS:  1. Paxil 20 mg daily.  2. Seroquel 50 mg q.h.s.  3. Klonopin 1 mg p.o. t.i.d.  4. Lyrica 75 mg p.o. t.i.d.  5. Ibuprofen 600 mg p.o. t.i.d.  As indicated above, she has not been      compliant with her medications recently.   DRUG ALLERGIES:  She states she is allergic to ABILIFY.   PHYSICAL FINDINGS:  There were no acute physical or medical problems  noted in the ED prior to her admission here.   HOSPITAL COURSE:  Upon  admission, the patient was started on Haldol 5 mg  p.o. q.6 h. p.r.n. agitation and Ativan 2 mg p.o. q.6 h. p.r.n. anxiety.  She was then started on Haldol 5 mg p.o. q.a.m. and q.h.s. and Cogentin  0.5 mg p.o. q.a.m. and q.h.s.  She was also started on a Nicoderm patch  21 mg daily as per smoking cessation protocol.  In individual sessions,  the patient initially was cooperative, but delusional about her  pregnancy and her affair with the policeman.  She had loose associations  and flight of ideas.  She denied any suicidal or homicidal ideation, but  she was very anxious particularly revolving around her pregnancy.  Insight and judgment were poor.  On June 23, 2008, she continued to  discuss her pregnancy by a local policeman, even though she was told  by the doctors that she was not pregnant.  She states she is agitated in  the ED, because  she knew she was pregnant.  She was also delusional  about being involved with the FBI and having them follow her.  On  June 24, 2008, she stated I do not feel pregnant.  She stated she  knew she was not pregnant and she believed what the doctors had told.  Her sleep was good.  Appetite was good.  Mood was less depressed and  less anxious.  She talked about a contentious separation.  She talked  about wanting to continue to see a Fish farm manager, which is  apparently not true.  She has a 50-B against her husband, but was  deciding to drop this.  On June 25, 2008, she was feeling much  better.  She stated that she still understood she was not pregnant.  She  was not having side effects from her medications.  On June 26, 2008,  sleep was good.  Appetite was good.  Mood was euthymic.  Affect was  consistent with mood.  There was no suicidal or homicidal ideation.  No  thoughts of self-injurious behavior.  No auditory or visual  hallucinations.  No paranoia or delusions.  Thoughts logical and goal-  directed.  Thought content no  predominant theme.  Cognitive was grossly  intact.  The patient felt safe for discharge today.  She was less angry  with her husband and decided to revoke the 50-B.  She still has  delusions about being a member of secret service, at baseline.  She is  delusional and has a long psychiatric history.   DISCHARGE DIAGNOSES:  Axis I:  Schizo-affective disorder, bipolar type,  marijuana abuse.  Axis II:  Features of borderline personality disorder.  Axis III:  Chronic back pain, bronchitis.  Axis IV:  Severe (perceived psychosocial stressor and problems with  primary support group, burden of psychiatric illness, burden of medical  problems).  Axis V:  Global assessment of functioning was 50 upon discharge.  GAF  was 35 upon admission.  GAF highest past year was 60.   DISCHARGE PLANS:  There was no specific activity level or dietary  restrictions.   POSTHOSPITAL CARE PLANS:  The patient will go to [facility] in [place] on  December 4th at 8 a.m.   DISCHARGE MEDICATIONS:  1. Haldol 5 mg p.o. q.a.m. and q.h.s.  2. Seroquel 50 mg q.h.s.  3. Benztropine 0.5 mg q.a.m. and q.h.s.  4. Lyrica 75 mg p.o. b.i.d.  5. Albuterol inhaler 2 puffs q.4 h. p.r.n. shortness of breath.      Jasmine Pang, M.D.  Electronically Signed     BHS/MEDQ  D:  06/26/2008  T:  06/26/2008  Job:  [id number]

## 2010-12-10 NOTE — H&P (Signed)
[name], [name]             ACCOUNT NO.:  [id number]   MEDICAL RECORD NO.:  [id number]          PATIENT TYPE:  IPS   LOCATION:  0504                          FACILITY[facility]  [facility]   PHYSICIAN:  [name], M.D.      DATE OF BIRTH:  [birth year]   DATE OF ADMISSION:  10/16/2008  DATE OF DISCHARGE:                       PSYCHIATRIC ADMISSION ASSESSMENT   This is a 45 year old female who was voluntarily admitted on October 16, 2008.   HISTORY OF PRESENT ILLNESS:  The patient reports a history of  depression, relapsed on cocaine over the weekend, taking approximately  50 dollars from her husband to buy the cocaine.  Denies any other  substances.  Not been sleeping well.  Is noncompliant with her  medications.  She states she has been having transportation issues to go  for her appointments in mental health.  She denies any other specific  stressors in her life.  Does not feel her medications are currently  working.  She states that she had called the rescue squad to come get  her due to her ongoing depression.   PAST PSYCHIATRIC HISTORY:  The patient has been here several times  before.  Her last visit was on November 2009 for some delusional  thinking where she thought she was pregnant.  She was to see Dr. Thomasena Edis  for outpatient mental health treatment.  In the past has been on Paxil,  Xanax and Ambien which she felt the Paxil was of great benefit for her.   SOCIAL HISTORY:  A 45 year old married female.  She has been  married  for 15 years.  Lives in [place].  No children.  She is on disability.   FAMILY HISTORY:  None.   ALCOHOL AND DRUG HISTORY:  The patient smokes and has relapsed on  cocaine.  Using marijuana as well.   PRIMARY CARE Devan Danzer:  Dr. Sudie Bailey who is a primary care Ricco Dershem.   MEDICAL PROBLEMS:  A history of sciatica.   MEDICATIONS:  Listed as:  1. Haldol 5 mg a b.i.d.  2. Seroquel 50 mg at h.s.  3. Cogentin 0.5 mg b.i.d.  4. Lyrica 75 mg b.i.d.  5.  Albuterol inhaler 2 puffs q.4 h. as needed.   DRUG ALLERGIES:  ABILIFY problems with edema.   PHYSICAL EXAM:  GENERAL:  This is a short-statured., disheveled middle-  aged female.  She was fully assessed at [facility].  Physical exam was  reviewed with no significant findings.  VITAL SIGNS:  Temperature of 97, heart rate 66, respirations 20, blood  pressure is 120/88, height 5 feet, 1 inches tall, 200 pounds.   LABORATORY DATA:  Shows a pregnancy test is negative.  Platelet count is  123.  Urinalysis is negative.  Alcohol level less than 5.  Urine drug  screen is positive for opiates, positive for cocaine, positive for  benzodiazepines, positive for THC.   MENTAL STATUS EXAM:  The patient was in the hallway.  She is dressed in  scrubs at this time.  She is waiting to get her medications but willing  to talk.  She has poor eye  contact.  Her speech is slow, appears  somewhat sedated.  Her speech is depressed.  The patient's affect is  blunted.  Thought processes are coherent.  She denies any suicidal or  homicidal thoughts.  Denies any hallucinations.  No delusional  statements made.  Cognitive function intact.  Her memory appears intact.  Judgment and insight is minimal.   DIAGNOSES:  AXIS I:  Mood disorder, polysubstance abuse.  AXIS II:  Deferred.  AXIS III:  Chronic back pain.  AXIS IV:  Medical problems.  AXIS V:  Current is 35.   Our plan is to clarify her medications.  We will resume her Paxil as the  patient reported benefit from that medication.  Will detox the patient  at this time from Klonopin and have Librium available on a p.r.n. basis  for any withdrawal symptoms.  Will address her substance use.  Case  manager will assess rehab programs available to the patient.  Will  reinforce medication compliance and follow up visits.  Her tentative  length of stay at this time is 3-5 days.      Landry Corporal, N.P.      [name], M.D.  Electronically Signed     JO/MEDQ  D:  10/17/2008  T:  10/17/2008  Job:  [id number]

## 2010-12-10 NOTE — Discharge Summary (Signed)
NAMESHARAYA, [name]             ACCOUNT NO.:  [id number]   MEDICAL RECORD NO.:  [id number]          PATIENT TYPE:  IPS   LOCATION:  0300                          [facility]   PHYSICIAN:  Jasmine Pang, M.D. DATE OF BIRTH:  [birth year]   DATE OF ADMISSION:  03/07/2009  DATE OF DISCHARGE:  03/12/2009                               DISCHARGE SUMMARY   IDENTIFICATION:  This is a 45 year old separated white female from  [place], [place], who was admitted on an involuntary basis on  March 07, 2009.   HISTORY OF PRESENT ILLNESS:  The patient presents after being assessed  in the emergency department with a history of delusional thinking.  She  believes she is pregnant for 4 months.  She has been in the ER for  several visits with negative pregnancy test.  She stopped taking her  bipolar medications because she does not feel that she needs them  anymore.  She denies any history of substance use.  She denies any other  specific stressors.  She also believes that she is in a relationship  with the police officer who communicates with her through the computer  and the radio.  The patient has had several admissions to our facility.  She was here in March 2010 for depression and cocaine use.  She has a  history of bipolar disorder.  She is a client at [facility].  For further  admission information, see psychiatric admission assessment.  An initial  Axis I diagnosis of delusional disorder was given.  On Axis III, she was  diagnosed with urinary tract infection.   PHYSICAL FINDINGS:  There were no acute physical or medical problems  noted.  She was fully assessed at [facility].   LABORATORY DATA:  BMET was within normal limits.  Alcohol level less  than 5.  Platelet count 135.  Urine pregnancy test was negative.   HOSPITAL COURSE:  Upon admission, the patient was started on her home  medications of gabapentin 600 mg p.o. b.i.d., clonazepam 1 mg h.s.,  naproxen 500 mg  b.i.d., and ciprofloxacin 500 mg p.o. b.i.d. for 7 days.  She was also started on Zyprexa Zydis 5 mg p.o. q.4 h. p.r.n. anxiety  and agitation.  In individual sessions, the patient was disheveled with  psychomotor retardation.  Mood was depressed and anxious.  Affect was  consistent with mood.  She had no suicidal ideation, otherwise positive  delusion.  No evidence of bipolar disorder. She was started on Zyprexa  10 mg p.o. q.h.s.  The patient continued to be depressed and anxious.  she was sad and tearful.  She continued to believe she was pregnant.  She believes she has been in a relationship with a Emergency planning/management officer for  years (she states I am positive, that she is pregnant.  The patient  was quite distraught when I told her that pregnancy test was negative,  she initially refused to believe it, but later on either seemed to have  excepted or decided not to talk about it, in a further sense she knew  people did not believe  she was pregnant.  There was positive paranoid  ideation about her husband.  Mother had been contacted and stated the  patient has been very delusional about her husband.  She also stated  that police had been called to the house many times due to allegations  that the patient made against her husband, which were not true.  As  hospitalization progressed, she began to talk less about her involvement  with the police officer.  She no longer talked about being pregnant.  Sleep was good and appetite was good.  On March 12, 2009, mental status  had improved markedly from admission status, the patient had good eye  contact.  She was dressed casually and neatly.  Her speech was normal  rate and flow.  Eye contact was good.  The mood was less depressed, less  anxious.  Affect consistent with mood.  There was no suicidal ideation  or homicidal ideation.  No thoughts of self-injurious behavior.  No  auditory or visual hallucinations.  She had positive delusions about her   involvement with a Emergency planning/management officer, but this appeared to be at baseline,  she was no longer talking about the delusions of being pregnant.  Thoughts were logical and goal directed.  The cognitive was grossly back  to baseline.  The patient wanted to go home today and was felt she was  safe for discharge.   DISCHARGE DIAGNOSES:  Axis I:  Delusional disorder, depressive disorder,  not otherwise specified.  Axis II: Features of personality disorder, not otherwise specified.  Axis III:  Urinary tract infection.  Axis IV:  Moderate (burden of delusional disorder and ramifications).  Axis V:  Global assessment of functioning was 50 upon discharge.  Global  assessment of functioning was 30 upon admission.  Global assessment of  functioning highest past year was 55-60.   DISCHARGE PLAN:  There were no specific activity level or dietary  restrictions.   POSTHOSPITAL CARE PLANS:  The patient will be seen at the [facility] in  [place] on March 14, 2009 at 8 o'clock the a.m.   DISCHARGE MEDICATIONS:  1. Zyprexa 10 mg at bedtime.  2. Cipro 500 mg twice a day until finished.  3. Naproxen 500 mg twice a day.  4. Klonopin 1 mg at bedtime.  5. Gabapentin 600 mg twice a day.      Jasmine Pang, M.D.  Electronically Signed     BHS/MEDQ  D:  03/12/2009  T:  03/13/2009  Job:  [id number]

## 2010-12-10 NOTE — Consult Note (Signed)
[name], [place]ENHEAD             ACCOUNT NO.:  [id number]   MEDICAL RECORD NO.:  [id number]          PATIENT TYPE:  AMB   LOCATION:  DAY                           FACILITY:  [facility]   PHYSICIAN:  R. Roetta Sessions, M.D. DATE OF BIRTH:  06-Nov-1965   DATE OF CONSULTATION:  04/04/2008  DATE OF DISCHARGE:                                 CONSULTATION   REASON FOR CONSULTATION:  Chronic abdominal pain.   PHYSICIAN REQUESTING CONSULTATION:  Mila Homer. Sudie Bailey, MD   HISTORY OF PRESENT ILLNESS:  [name] is a 45 year old lady with  multiple medical problems including fibromyalgia, bipolar disorder,  history of opiate drug dependency, history of prior psych admissions  last year, who presents today for further evaluation of chronic  abdominal pain.  She states that she has had pain chronically consisting  of her fibromyalgia pain as well as chronic abdominal pain.  She states  this has been going on for months or maybe even more than a year.  She  is somewhat a difficult historian and trying to focus on her questions  and answers.  She also contradicts herself on numerous occasions during  her history taking.  She states she has had nausea for almost a year.  She wakes up with it and that it persists throughout the day.  She has  tried [employer] and states that she has taken way too much of this without  any results.  She does have vomiting intermittently and went to the  emergency department recently due to persistent vomiting.  ED visit was  on March 04, 2008.  She has intermittent heartburn.  She complains of  dysphagia to solid foods.  She complains of anorexia.  She states she  only eats once a day due to the nausea and anorexia.  She forces herself  to eat.  She does state that she vomits about every day.  She states she  has gained about 6 pounds in the last 2 months.  She complains of  bilateral upper abdominal pain and pain in both lower rib cage margins.  She states this is worse  if she sits for prolonged periods of time at  her desk.  She complains of occasional blood in the stool.  She notes  mostly constipation with occasional loose stools.  Sometimes, she states  she does not have a bowel movement over 3 weeks even using Ex-Lax  equivalent.  She has a history of opioid dependency according to Dr.  Michelle Nasuti note.  The patient states they have tried to wean her off the  Vicodin, but she still takes it for her fibromyalgia.  She is very vague  with a lot of her history.  Apparently, they did wean her off her Xanax  for some reason.  [facility] and has another  appointment tomorrow.  She tells me that she is studying criminal  law/criminal justice.  She admits to daily marijuana use and has used it  fairly frequently since age 45.  She admittedly has a history of buying  drugs off the street such as narcotics  and Xanax.  She has had psych  admissions previously and has been [facility] to [facility].   CURRENT MEDICATIONS:  Lyrica 50 mg t.i.d., clonazepam 1 mg t.i.d.,  paroxetine daily, hydrocodone 5/500 t.i.d., Pro-Air inhaler.  She states  she is on Kabune 7.5 mg.   ALLERGIES:  No known drug allergies.   PAST MEDICAL HISTORY:  1. Bipolar disorder.  2. Diabetes mellitus.  3. Fibromyalgia.  4. Morbid obesity.  5. Opiate drug dependency.  6. History of drug abuse.  7. Asthma.  8. Status post cholecystectomy.  9. Status post teeth extractions.  10.Multiple psych admissions since 2001 for impulsive behavior and      psychotic symptoms.   FAMILY HISTORY:  Negative for colorectal cancer.  Mother and father are  both healthy.  Brother apparently has a history of schizophrenia.   SOCIAL HISTORY:  She is married with no children.  She is having quite a  bit of marital difficulties.  She is on disability, but states she is  taking online classes for criminal law/criminal justice.  She smokes one  pack of cigarettes daily.  No  alcohol use.  She admittedly smokes  marijuana on a daily basis and has smoked off and on since age 45.  Denies any other drug use currently.  No alcohol use.   REVIEW OF SYSTEMS:  See HPI for GI.  CONSTITUTIONAL:  No unintentional  weight loss.  CARDIOPULMONARY:  Denies chest pain, shortness of breath,  cough, or palpitations.  GENITOURINARY:  Denies dysuria or hematuria.   PHYSICAL EXAMINATION:  VITAL SIGNS:  Weight 242, height 5 feet 4 inches,  temp 97.4, blood pressure 118/80, pulse 60.  GENERAL:  Pleasant, somewhat disheveled-appearing, obese Caucasian  female in no acute distress.  SKIN:  Warm and dry.  No jaundice.  HEENT:  Sclerae nonicteric.  Oropharyngeal mucosa moist and pink.  No  lymphadenopathy.  CHEST:  Lungs are clear to auscultation.  CARDIAC:  Reveals regular rate and rhythm.  No murmurs, rubs, or  gallops.  ABDOMEN:  Positive bowel sounds.  Obese.  Abdomen is soft with minimal  palpation.  She has moderate tenderness, primarily in the upper abdomen,  predominantly over the lower ribs bilaterally.  She has minimal  tenderness in the lower abdomen.  No rebound or guarding.  No  organomegaly or masses.  LOWER EXTREMITIES:  No edema.   CT of the abdomen and pelvis with contrast on February 29, 2008 revealed  hepatic steatosis, prominent mesenteric collateral vein, questionable  portal hypertension, otherwise negative.  Labs from February 29, 2008,  lipase 15, amylase 28.  BUN and creatinine normal.  LFTs normal.  CBC  normal except for MCV of 103.6.  Most recent labs from Dr. Michelle Nasuti  office in April 2009, MCV was normal at 97.9, LDL elevated at 120,  triglycerides 170, and hemoglobin A1c 5.4.   IMPRESSION:  The patient is a 45 year old lady with significant  psychiatric issues who presents with chronic abdominal pain with chronic  nausea, vomiting, constipation, hematochezia, dysphagia, and daily  marijuana use.  She apparently has been seen in this practice over 5   years ago with complaints of abdominal pain, and we are trying to find  those prior records.  Recent CT revealed a prominent mesenteric  collateral vein and questionable portal hypertension.  No other  collaterals seen.  No known history of chronic liver disease.  She has  multiple complaints of nausea, vomiting, dysphagia, heartburn, which  will all  be addressed at the time of EGD.  With ongoing daily marijuana  use, she is at risk of cannabinoid hyperemesis syndrome.  She does  describe compulsive bathing, which would go along with that as well.  She carries a history of IVS.  With regards to intermittent hematochezia  and chronic constipation, she needs to have a colonoscopy.  Some of her  abdominal pain is definitely musculoskeletal.  She has tenderness with  palpation of her lower ribs.  However, in light of the other symptoms  she is having, we do recommend upper and lower endoscopy.   PLAN:  1. Colonoscopy and EGD with Dr. Jena Gauss.  This will be carried out in      the OR under propofol as I suspect that she would be very difficult      to sedate with her history of chronic narcotic use, anxiolytics,      and depressive medications.  2. Phenergan 12.5-25 mg p.o. q.8 h. p.r.n. nausea #20 with 0 refills.  3. We will discuss CT findings with Dr. Jena Gauss, and further      recommendations to follow regarding that.  4. Encourage marijuana cessation.  Literature provided to the patient.  5. Consider B12 and folate levels with elevated MCV.  We will leave      this up to Dr. Michelle Nasuti discretion.   I would like to thank Dr. Sudie Bailey for allowing [place] to take part in the  care of this patient.      Tana Coast, P.AJonathon Bellows, M.D.  Electronically Signed    LL/MEDQ  D:  04/04/2008  T:  04/05/2008  Job:  [id number]   cc:   Mila Homer. Sudie Bailey, M.D.  Fax: [phone]
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
## 2010-12-13 NOTE — Discharge Summary (Signed)
NAMESATIA, WINGER             ACCOUNT NO.:  [id number]   MEDICAL RECORD NO.:  [id number]          PATIENT TYPE:  IPS   LOCATION:  0503                          [facility]ACILITY[facility]  [facility]   PHYSICIAN:  Jeanice Lim, M.D. DATE OF BIRTH:  Feb 14, 1966   DATE OF ADMISSION:  02/17/2005  DATE OF DISCHARGE:  02/20/2005                                 DISCHARGE SUMMARY   IDENTIFYING DATA:  This is a 45 year old married Caucasian female  voluntarily admitted.  History of depression, escalating.  Planning to hurt  self.  Wanting to cut wrist.  Sees Dr. Omelia Blackwater.  Has history of psychosis and  was here in April of 2005.  Brother has schizophrenia.   MEDICATIONS:  Xanax, Paxil, Abilify, Skelaxin.  Compliance unclear.   ALLERGIES:  No known drug allergies.   PHYSICAL EXAMINATION:  Physical and neurologic exam performed at [facility]  and within normal limits.   MENTAL STATUS EXAM:  Fully alert, disheveled.  Speech normal.  Mood  depressed.  Thought processes goal directed.  Cognitively intact.  Judgment  and insight poor.   ADMISSION DIAGNOSES:  AXIS I:  Bipolar disorder.  Polysubstance abuse.  AXIS II:  Deferred.  AXIS III:  Bronchitis.  Fibromyalgia.  AXIS IV:  Moderate (problems with primary support group, other psychosocial  issues).  AXIS V:  35/60.   HOSPITAL COURSE:  The patient was admitted and ordered routine p.r.n.  medications and underwent further monitoring.  Was encouraged to participate  in individual, group and milieu therapy.  Was stabilized on medications and  reported a positive response to crisis intervention and stabilization.   CONDITION ON DISCHARGE:  The patient was discharged in improved condition  with no dangerous ideation.  Mood improved.  Was future-oriented.  Problem-  solving.  Improved insight.  Calm.  Tolerating medications.  Doing much  better.  Having a perspective.  The patient was given medication education.   DISCHARGE MEDICATIONS:  1.  Abilify 15 mg,  take 2 q.h.s.  2.  Xanax 1 mg t.i.d. and 1.5 mg q.h.s.  3.  Paxil 10 mg daily.  4.  Trazodone 100 mg at 8 p.m.  5.  Seroquel 50 mg q.6h. p.r.n. agitation.   FOLLOW UP:  The patient was discharged to follow up with Dr. Omelia Blackwater.   DISCHARGE DIAGNOSES:  AXIS I:  Bipolar disorder.  Polysubstance abuse.  AXIS II:  Deferred.  AXIS III:  Bronchitis.  Fibromyalgia.  AXIS IV:  Moderate (problems with primary support group, other psychosocial  issues).  AXIS V:  GAF on discharge 55.      Jeanice Lim, M.D.  Electronically Signed     JEM/MEDQ  D:  03/27/2005  T:  03/28/2005  Job:  [id number]
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
## 2010-12-13 NOTE — H&P (Signed)
[name], [name]             ACCOUNT NO.:  [id number]   MEDICAL RECORD NO.:  [id number]          PATIENT TYPE:  IPS   LOCATION:  0503                          FACILITY:  [facility]   PHYSICIAN:  Jeanice Lim, M.D. DATE OF BIRTH:  1965-12-20   DATE OF ADMISSION:  02/17/2005  DATE OF DISCHARGE:                         PSYCHIATRIC ADMISSION ASSESSMENT   IDENTIFYING INFORMATION:  This is a 45 year old married white female  voluntarily admitted on February 17, 2005.   HISTORY OF PRESENT ILLNESS:  The patient presents with a history of  depression.  She states her history has been escalating.  Feeling depressed  over the past few months.  She was having thoughts to hurt herself.  Wanted  to cut her wrist and did try with a piece of glass.  The patient states she  has been having panic attacks.  Feels angry at herself.  Losing interest in  things.  Husband is upset with her.  She is not attending to her household  activities.  She has been sleeping too much.  Her appetite has been  satisfactory.  She denies any hallucinations.  The patient does report a  history of recent cocaine and marijuana use.  States it is very  intermittent.  Her stressors are having marital conflict and relapsing on  drugs.   PAST PSYCHIATRIC HISTORY:  The patient was here in April of 2005 for  psychotic symptoms.  Sees Dr. Omelia Blackwater in [place].  Her last visit was  about a month ago.  No history of a suicide attempt.   SOCIAL HISTORY:  This is a 45 year old married white female.  Married for 12  years.  No children.  Lives with her husband.  On disability.  No legal  charges.   FAMILY HISTORY:  Brother with schizophrenia.  The patient smokes.  There is  no apparent alcohol use.  Recently began using marijuana and cocaine.   PRIMARY CARE PHYSICIAN:  Unknown.   MEDICAL PROBLEMS:  Bronchitis, fibromyalgia and anemia.   MEDICATIONS:  Xanax 2 mg t.i.d., Skelaxin 800 mg t.i.d., Abilify 30 mg  daily, Symbyax 12/25  mg daily.  Medications were clarified at [facility] in [place].   ALLERGIES:  No known allergies.   PHYSICAL EXAMINATION:  The patient was assessed at [facility].  This is an  unkempt, overweight female in no acute distress.  She has broken  fingernails.  Fingernails are partially polished.  She did receive some  Ativan 1 mg while in the emergency department.  Temperature is 97.8, heart  rate 92, respirations 22, blood pressure 123/82, height 5 feet 5 inches  tall, weight 188 pounds.   LABORATORY DATA:  Alcohol level less than 5.  BMET within normal limits.  CBC within normal limits.  Urine drug screen is positive for  benzodiazepines, positive for THC and positive for cocaine.   MENTAL STATUS EXAM:  She is alert.  There is no eye contact.  Somewhat  disheveled.  Dressed in hospital gown.  Speech is normal tone, appears  mildly slurred.  The patient feels very frustrated.  Affect is constricted.  Thought processes  are coherent.  No evidence of psychosis.  Denied any  current suicidal or homicidal thoughts.  Cognitive function intact.  Memory  is fair.  Judgment is poor.  Insight is limited.   DIAGNOSES:   AXIS I:  1.  Bipolar disorder, type 2.  2.  Polysubstance abuse.   AXIS II:  Deferred.   AXIS II:  1.  Bronchitis.  2.  Fibromyalgia.   AXIS IV:  Problems with primary support group, other psychosocial problems  related to drug use.   AXIS V:  Current 35; past year 60-65.   PLAN:  To stabilize mood and thinking.  Relapse prevention.  The patient is  to attend all individual and group therapy.  Will clarify medications.  We  will begin to taper Xanax slowly and initiate Paxil as patient reports that  was effective in the past and have trazodone for sleep.  Will have a family  session with husband for support and education.   TENTATIVE LENGTH OF STAY:  Five to seven days.       JO/MEDQ  D:  02/17/2005  T:  02/17/2005  Job:  [id number]
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
## 2010-12-13 NOTE — Discharge Summary (Signed)
NAME:  [name], [name]                       ACCOUNT NO.:  [id number]   MEDICAL RECORD NO.:  [id number]                   PATIENT TYPE:  IPS   LOCATION:  0406                                 FACILITY:  [facility]   PHYSICIAN:  [name], M.D.                   DATE OF BIRTH:  1966/01/13   DATE OF ADMISSION:  11/13/2003  DATE OF DISCHARGE:  11/16/2003                                 DISCHARGE SUMMARY   CHIEF COMPLAINT AND PRESENTING ILLNESS:  This was the first admission to  [facility]  for this 45 year old married white female,  voluntarily admitted.  History of psychosis.  Has been receiving telepathic  messages from the computer referring to a big drug bust.  Feels like she is  also pregnant because she is having some nausea at this time but she has  never been pregnant before.  She has been noncompliant with her medications.  Reports also her ADLs have been decreased for the past 2 days because she is  trying to get enrolled into a class of criminal justice.  Reported decreased  sleep and decreased appetite for the past 2 days.   PAST PSYCHIATRIC HISTORY:  She was admitted 3 weeks prior to this admission  with similar type episodes.  Sees Dr. [name] in [place].  History of  bipolar disorder.   ALCOHOL AND DRUG HISTORY:  Drinks alcohol on occasions, has been smoking  marijuana and has a history of crack cocaine use.   PAST MEDICAL HISTORY:  Denies history of any major medical conditions.   MEDICATIONS:  Abilify 10 mg, Strattera 25 at night, Ambien 10 at bedtime,  Adderall XL 5 mg in the morning, Xanax 2 mg three times a day.  She was  discharged on Paxil and Risperdal with her last admission but she was not  taking.   PHYSICAL EXAMINATION:  Performed, failed to show any acute findings.   LABORATORY WORKUP:  CBC within normal limits.  Sodium 133, potassium 3.1,  BUN is 4, blood sugar 112.  Urine drug screen positive for benzodiazepine,  amphetamines and  marijuana.  Urine pregnancy test was negative.   MENTAL STATUS EXAM:  Reveals an alert, cooperative female, fair eye contact,  casually dressed, somewhat unkempt.  Speech was tangential and pressured,  felt good at the time of the evaluation.  She laughed inappropriately at  times, reporting telepathic images.  Seemed to be having some delusional  thinking, possibly some grandiose ideas.  Cognition well preserved.  No  evidence of hallucinations.   ADMISSION DIAGNOSES:   AXIS I:  1. Bipolar disorder with psychotic features.  2. Marijuana abuse.   AXIS II:  No diagnosis.   AXIS III:  No diagnosis.   AXIS IV:  Moderate.   AXIS V:  Global assessment of function upon admission 30, highest global  assessment of function in past year 55.   COURSE IN  HOSPITAL:  She was admitted and started on intensive individual  and group psychotherapy.  She was initially placed on the medication on  which she was discharged from this unit a few weeks prior to this admission,  but then the medications were reviewed.  She was basically maintained on  Ambien 10 at bedtime for sleep, Risperdal 0.5 in the morning and 1 mg at  night.  She was given some Ativan p.r.n.  Paxil was officially discontinued.  She stated that she was having a difficult time with her husband but the  husband wanted to stay with her and take care of her.  She wanted to be  discharged.  She was labile, irritable.  She was minimizing the symptoms.  She said she was better and it seemed like what was agitating her was being  in the hospital.  We continued to stabilize.  By April 20 she was saying  that she was no longer telepathic.  Husband was concerned with her  noncompliance.  She claimed that she was going to take her medications.  By  April 21 she was better.  Contact with the husband and the daughter then,  they had no concerns about discharge.  She was in full contact with reality.  There were no suicidal ideas, no homicidal  ideas, no hallucinations, no  delusions.  She felt she was ready to go home.  She was anxious because she  wanted to go home.  Her affect was bright, broad.  There was no evidence of  suicidal or homicidal ideations, no evidence of active delusions or  hallucinations.  We went ahead and discharged to outpatient follow-up.   DISCHARGE DIAGNOSES:   AXIS I:  1. Bipolar disorder with psychotic features.  2. Marijuana abuse.   AXIS II:  No diagnosis.   AXIS III:  No diagnosis.   AXIS IV:  Moderate.   AXIS V:  Global assessment of function upon discharge 55-60.   DISCHARGE MEDICATIONS:  1. Risperdal 0.5 at 9 a.m. and 1 mg at night.  2. Ambien 10 at bedtime for sleep.   DISPOSITION:  Follow up with Dr. [name], [facility].                                               [name], M.D.    IL/MEDQ  D:  12/06/2003  T:  12/07/2003  Job:  [id number]
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
## 2010-12-13 NOTE — Discharge Summary (Signed)
[facility]  Patient:    [name], [name]                      MRN: [id number] Adm. Date:  [id number] Attending:  Beryle Beams                           Discharge Summary  The patient has a longstanding problem with bipolar disorder.  She has been smoking marijuana on and off on a daily basis for 20 years.  Husband indicated that she just started developing excessive problem with _______ recently. Her husband is very supportive of patients return home.  He indicated in the past had been on Paxil with good results and before that had been on lithium for _______ years.  CONDITION ON DISCHARGE:  The patients psychotic symptoms have entirely resolved, and she shows excellent reality connectiveness.  Shows evidence of a full affect.  States that she is feeling better than she has done in years; however, _______ she was exhibiting on admission was more consistent with a diagnosis of schizophrenia or schizoaffective disorder, given the feelings of passivity and influence are primary Schneiderian criteria, and her diagnosis might need to be revised accordingly.  The patient is presently on Klonopin 1 mg t.i.d., Risperdal 200 mg h.s., and Depakote ER 500 mg h.s.  A Depakote level was drawn yesterday, and the results are pending.  The patient does not show any evidence of sedation, and she is tolerating her medication well and with considerable benefit.  She was advised that she has a chronic illness and needs to stay on medications.  DISCHARGE MEDICATIONS:  She was discharged with prescriptions for: 1. Klonopin 1 mg t.i.d. 2. Risperdal 200 mg h.s. 3. Depakote ER 500 mg h.s.  DISCHARGE INSTRUCTIONS:  She was advised not to drink or use street drugs.  DISCHARGE FOLLOWUP:  She was to follow with [facility] for medication management and marital therapy.  She is advised to follow with Depakote levels, liver profile, CBCs, and serum  amylase. DD:  07/31/00 TD:  07/31/00 Job: [id number]
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
## 2011-03-29 ENCOUNTER — Emergency Department (HOSPITAL_COMMUNITY)
Admission: EM | Admit: 2011-03-29 | Discharge: 2011-03-29 | Disposition: A | Discharge status: Home / Self Care | Payer: Medicaid Other | Attending: Emergency Medicine | Admitting: Emergency Medicine

## 2011-03-29 DIAGNOSIS — F319 Bipolar disorder, unspecified: Secondary | ICD-10-CM | POA: Insufficient documentation

## 2011-03-29 DIAGNOSIS — R739 Hyperglycemia, unspecified: Secondary | ICD-10-CM

## 2011-03-29 DIAGNOSIS — R7309 Other abnormal glucose: Secondary | ICD-10-CM | POA: Insufficient documentation

## 2011-03-29 HISTORY — DX: Bipolar disorder, unspecified: F31.9

## 2011-03-29 LAB — RAPID URINE DRUG SCREEN, HOSP PERFORMED
Amphetamines: NOT DETECTED
Barbiturates: NOT DETECTED
Benzodiazepines: NOT DETECTED
Cocaine: NOT DETECTED
Opiates: NOT DETECTED
Tetrahydrocannabinol: POSITIVE — AB

## 2011-03-29 LAB — URINALYSIS, ROUTINE W REFLEX MICROSCOPIC
Bilirubin Urine: NEGATIVE
Glucose, UA: >1000 mg/dL — AB
Hgb urine dipstick: NEGATIVE
Specific Gravity, Urine: <1.005 — ABNORMAL LOW (ref 1.005–1.030)
Urobilinogen, UA: 0.2 mg/dL (ref 0.0–1.0)
pH: 6 (ref 5.0–8.0)

## 2011-03-29 LAB — DIFFERENTIAL
Basophils Relative: 1 % (ref 0–1)
Lymphs Abs: 1.7 10*3/uL (ref 0.7–4.0)
Monocytes Absolute: 0.5 10*3/uL (ref 0.1–1.0)
Monocytes Relative: 9 % (ref 3–12)
Neutro Abs: 3.8 10*3/uL (ref 1.7–7.7)
Neutrophils Relative %: 62 % (ref 43–77)

## 2011-03-29 LAB — CBC
HCT: 40.8 % (ref 36.0–46.0)
Hemoglobin: 14.3 g/dL (ref 12.0–15.0)
MCHC: 35 g/dL (ref 30.0–36.0)
RBC: 4.4 MIL/uL (ref 3.87–5.11)

## 2011-03-29 LAB — BASIC METABOLIC PANEL
BUN: 4 mg/dL — ABNORMAL LOW (ref 6–23)
CO2: 28 mEq/L (ref 19–32)
Chloride: 94 mEq/L — ABNORMAL LOW (ref 96–112)
Creatinine, Ser: 0.51 mg/dL (ref 0.50–1.10)
Glucose, Bld: 432 mg/dL — ABNORMAL HIGH (ref 70–99)
Potassium: 3.9 mEq/L (ref 3.5–5.1)

## 2011-03-29 LAB — URINE MICROSCOPIC-ADD ON

## 2011-03-29 LAB — GLUCOSE, CAPILLARY: Glucose-Capillary: 266 mg/dL — ABNORMAL HIGH (ref 70–99)

## 2011-03-29 MED ORDER — INSULIN ASPART 100 UNIT/ML ~~LOC~~ SOLN
6.0000 [IU] | Freq: Once | SUBCUTANEOUS | Status: AC
  Administered 2011-03-29: 100 [IU] via INTRAVENOUS

## 2011-03-29 MED ORDER — METFORMIN HCL 500 MG PO TABS: 500.0000 mg | ORAL_TABLET | Freq: Two times a day (BID) | ORAL | Status: DC

## 2011-03-29 MED ORDER — INSULIN ASPART 100 UNIT/ML ~~LOC~~ SOLN
15.0000 [IU] | Freq: Once | SUBCUTANEOUS | Status: AC
  Administered 2011-03-29: 15 [IU] via INTRAVENOUS

## 2011-03-29 MED ORDER — SODIUM CHLORIDE 0.9 % IV BOLUS (SEPSIS)
1500.0000 mL | Freq: Once | INTRAVENOUS | Status: AC
  Administered 2011-03-29: 1500 mL via INTRAVENOUS

## 2011-03-29 MED ORDER — SODIUM CHLORIDE 0.9 % IV SOLN: INTRAVENOUS | Status: DC

## 2011-03-29 MED ORDER — INSULIN ASPART 100 UNIT/ML ~~LOC~~ SOLN
5.0000 [IU] | Freq: Once | SUBCUTANEOUS | Status: AC
  Administered 2011-03-29: 5 [IU] via SUBCUTANEOUS

## 2011-03-29 NOTE — ED Notes (Signed)
Pt has been quiet and cooperative since in ed

## 2011-03-29 NOTE — ED Notes (Signed)
Per ems, called out for pt being under a lot of stress. States when they checked her cbg her bs [phone]

## 2011-03-29 NOTE — ED Notes (Signed)
edp eval pt. Pt has been quiet and cooperative since in the ed
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
## 2011-04-08 ENCOUNTER — Encounter (HOSPITAL_COMMUNITY): Payer: Self-pay | Admitting: Emergency Medicine

## 2011-04-08 ENCOUNTER — Emergency Department (HOSPITAL_COMMUNITY)
Admission: EM | Admit: 2011-04-08 | Discharge: 2011-04-09 | Disposition: A | Discharge status: Other Acute Inpatient Hospital | Payer: Medicaid Other | Source: Home / Self Care | Attending: Emergency Medicine | Admitting: Emergency Medicine

## 2011-04-08 DIAGNOSIS — F17200 Nicotine dependence, unspecified, uncomplicated: Secondary | ICD-10-CM | POA: Insufficient documentation

## 2011-04-08 DIAGNOSIS — E669 Obesity, unspecified: Secondary | ICD-10-CM | POA: Insufficient documentation

## 2011-04-08 DIAGNOSIS — F319 Bipolar disorder, unspecified: Secondary | ICD-10-CM | POA: Insufficient documentation

## 2011-04-08 DIAGNOSIS — E119 Type 2 diabetes mellitus without complications: Secondary | ICD-10-CM | POA: Insufficient documentation

## 2011-04-08 DIAGNOSIS — F1910 Other psychoactive substance abuse, uncomplicated: Secondary | ICD-10-CM

## 2011-04-08 LAB — CBC
Hemoglobin: 14.8 g/dL (ref 12.0–15.0)
MCHC: 34.9 g/dL (ref 30.0–36.0)
Platelets: 137 10*3/uL — ABNORMAL LOW (ref 150–400)
RBC: 4.58 MIL/uL (ref 3.87–5.11)

## 2011-04-08 LAB — COMPREHENSIVE METABOLIC PANEL
ALT: 36 U/L — ABNORMAL HIGH (ref 0–35)
CO2: 27 mEq/L (ref 19–32)
Calcium: 9.6 mg/dL (ref 8.4–10.5)
Chloride: 94 mEq/L — ABNORMAL LOW (ref 96–112)
Creatinine, Ser: 0.56 mg/dL (ref 0.50–1.10)
GFR calc Af Amer: >60 mL/min (ref >60)
GFR calc non Af Amer: >60 mL/min (ref >60)
Glucose, Bld: 433 mg/dL — ABNORMAL HIGH (ref 70–99)
Total Bilirubin: 0.5 mg/dL (ref 0.3–1.2)

## 2011-04-08 LAB — PREGNANCY, URINE: Preg Test, Ur: NEGATIVE

## 2011-04-08 LAB — RAPID URINE DRUG SCREEN, HOSP PERFORMED
Amphetamines: NOT DETECTED
Opiates: NOT DETECTED

## 2011-04-08 LAB — GLUCOSE, CAPILLARY: Glucose-Capillary: 436 mg/dL — ABNORMAL HIGH (ref 70–99)

## 2011-04-08 LAB — ETHANOL: Alcohol, Ethyl (B): <11 mg/dL (ref 0–11)

## 2011-04-08 MED ORDER — ZIPRASIDONE MESYLATE 20 MG IM SOLR: 10.0000 mg | Freq: Once | INTRAMUSCULAR | Status: DC

## 2011-04-08 MED ORDER — ACETAMINOPHEN 325 MG PO TABS
650.0000 mg | ORAL_TABLET | ORAL | Status: DC | PRN
  Administered 2011-04-08: 650 mg via ORAL
  Filled 2011-04-08: qty 2

## 2011-04-08 MED ORDER — NICOTINE 21 MG/24HR TD PT24: 21.0000 mg | MEDICATED_PATCH | Freq: Every day | TRANSDERMAL | Status: DC

## 2011-04-08 MED ORDER — ZOLPIDEM TARTRATE 5 MG PO TABS: 5.0000 mg | ORAL_TABLET | Freq: Every evening | ORAL | Status: DC | PRN

## 2011-04-08 MED ORDER — INSULIN ASPART 100 UNIT/ML ~~LOC~~ SOLN: 0.0000 [IU] | Freq: Every day | SUBCUTANEOUS | Status: DC

## 2011-04-08 MED ORDER — INSULIN REGULAR HUMAN 100 UNIT/ML IJ SOLN: 10.0000 [IU] | Freq: Once | INTRAMUSCULAR | Status: DC

## 2011-04-08 MED ORDER — INSULIN ASPART 100 UNIT/ML ~~LOC~~ SOLN
10.0000 [IU] | Freq: Once | SUBCUTANEOUS | Status: AC
  Administered 2011-04-08: 10 [IU] via SUBCUTANEOUS

## 2011-04-08 MED ORDER — INSULIN ASPART 100 UNIT/ML ~~LOC~~ SOLN
0.0000 [IU] | Freq: Three times a day (TID) | SUBCUTANEOUS | Status: DC
  Administered 2011-04-08 – 2011-04-09 (×2): 15 [IU] via SUBCUTANEOUS

## 2011-04-08 MED ORDER — ONDANSETRON HCL 4 MG PO TABS: 4.0000 mg | ORAL_TABLET | Freq: Three times a day (TID) | ORAL | Status: DC | PRN

## 2011-04-08 MED ORDER — RISPERIDONE 2 MG PO TABS
2.0000 mg | ORAL_TABLET | Freq: Every day | ORAL | Status: DC
  Filled 2011-04-08: qty 1

## 2011-04-08 MED ORDER — LORAZEPAM 1 MG PO TABS
1.0000 mg | ORAL_TABLET | Freq: Three times a day (TID) | ORAL | Status: DC | PRN
  Administered 2011-04-09: 1 mg via ORAL
  Filled 2011-04-08: qty 1

## 2011-04-08 MED ORDER — ZIPRASIDONE HCL 20 MG PO CAPS: 20.0000 mg | ORAL_CAPSULE | Freq: Two times a day (BID) | ORAL | Status: DC

## 2011-04-08 MED ORDER — IBUPROFEN 400 MG PO TABS
600.0000 mg | ORAL_TABLET | Freq: Three times a day (TID) | ORAL | Status: DC | PRN
  Administered 2011-04-08: 600 mg via ORAL
  Filled 2011-04-08: qty 2
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
## 2011-04-08 NOTE — ED Notes (Signed)
Patients family called to check on patient. Family was told that know information could be given through the phone.

## 2011-04-08 NOTE — ED Notes (Signed)
Pt stating upper back pain pt given prn pain medications rn will continue to monitor efficy
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
## 2011-04-09 ENCOUNTER — Inpatient Hospital Stay (HOSPITAL_COMMUNITY)
Admission: AD | Admit: 2011-04-09 | Discharge: 2011-04-14 | DRG: 885 | Disposition: A | Discharge status: Home / Self Care | Payer: Medicaid Other | Attending: Psychiatry | Admitting: Psychiatry

## 2011-04-09 DIAGNOSIS — E669 Obesity, unspecified: Secondary | ICD-10-CM

## 2011-04-09 DIAGNOSIS — N390 Urinary tract infection, site not specified: Secondary | ICD-10-CM

## 2011-04-09 DIAGNOSIS — G8929 Other chronic pain: Secondary | ICD-10-CM

## 2011-04-09 DIAGNOSIS — J438 Other emphysema: Secondary | ICD-10-CM

## 2011-04-09 DIAGNOSIS — IMO0001 Reserved for inherently not codable concepts without codable children: Secondary | ICD-10-CM

## 2011-04-09 DIAGNOSIS — F17200 Nicotine dependence, unspecified, uncomplicated: Secondary | ICD-10-CM

## 2011-04-09 DIAGNOSIS — F312 Bipolar disorder, current episode manic severe with psychotic features: Principal | ICD-10-CM

## 2011-04-09 DIAGNOSIS — Z6379 Other stressful life events affecting family and household: Secondary | ICD-10-CM

## 2011-04-09 DIAGNOSIS — K589 Irritable bowel syndrome without diarrhea: Secondary | ICD-10-CM

## 2011-04-09 DIAGNOSIS — R339 Retention of urine, unspecified: Secondary | ICD-10-CM

## 2011-04-09 LAB — GLUCOSE, CAPILLARY: Glucose-Capillary: 151 mg/dL — ABNORMAL HIGH (ref 70–99)

## 2011-04-09 MED ORDER — LORAZEPAM 2 MG/ML IJ SOLN
1.0000 mg | Freq: Once | INTRAMUSCULAR | Status: DC
  Filled 2011-04-09: qty 1

## 2011-04-09 MED ORDER — LORAZEPAM 2 MG/ML IJ SOLN
1.0000 mg | Freq: Once | INTRAMUSCULAR | Status: AC
  Administered 2011-04-09: 1 mg via INTRAMUSCULAR

## 2011-04-09 NOTE — ED Notes (Signed)
CBG 360. MD notified.
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
## 2011-04-09 NOTE — ED Notes (Signed)
cbg 286 at 01:00 verbal order from dr strand to hold insulin

## 2011-04-09 NOTE — ED Notes (Signed)
Was informed by [facility] nurse Maureen Ralphs, RN that room was not available for patient at this time. Was informed that they would call when room was available.

## 2011-04-09 NOTE — Progress Notes (Signed)
0630 Patient has required a change to orders tonight to cover elevated glucose. She was begun on  sliding scale  ac and hs insulin. She is normally on metformin. Initial glucose was 466. She had received insulin x 2 with improvement. She has been evaluated by [name], ACT and is awaiting placement. Patient has been cooperative tonight. No voiced c/o.

## 2011-04-09 NOTE — ED Notes (Signed)
Attempted to call report to [facility]. Was told nurse was unavailable. Awaiting return call.

## 2011-04-09 NOTE — ED Notes (Signed)
Dr. Jeraldine Loots gave verbal order to reinitiate sliding scale that was previously put on hold by Dr. Colon Branch. Medicated as listed.

## 2011-04-09 NOTE — ED Notes (Signed)
A&Ox3. Skin w/p/d. Resp even and unlabored. Denies pain. Pt calm and cooperative. Awaiting transport.

## 2011-04-10 DIAGNOSIS — F323 Major depressive disorder, single episode, severe with psychotic features: Secondary | ICD-10-CM

## 2011-04-10 LAB — HEMOGLOBIN A1C: Hgb A1c MFr Bld: 12.4 % — ABNORMAL HIGH (ref <5.7)

## 2011-04-10 LAB — GLUCOSE, CAPILLARY
Glucose-Capillary: 316 mg/dL — ABNORMAL HIGH (ref 70–99)
Glucose-Capillary: 401 mg/dL — ABNORMAL HIGH (ref 70–99)
Glucose-Capillary: 388 mg/dL — ABNORMAL HIGH (ref 70–99)

## 2011-04-11 LAB — GLUCOSE, CAPILLARY: Glucose-Capillary: 261 mg/dL — ABNORMAL HIGH (ref 70–99)

## 2011-04-12 LAB — GLUCOSE, CAPILLARY
Glucose-Capillary: 211 mg/dL — ABNORMAL HIGH (ref 70–99)
Glucose-Capillary: 239 mg/dL — ABNORMAL HIGH (ref 70–99)
Glucose-Capillary: 148 mg/dL — ABNORMAL HIGH (ref 70–99)

## 2011-04-13 LAB — GLUCOSE, CAPILLARY
Glucose-Capillary: 198 mg/dL — ABNORMAL HIGH (ref 70–99)
Glucose-Capillary: 181 mg/dL — ABNORMAL HIGH (ref 70–99)

## 2011-04-14 LAB — GLUCOSE, CAPILLARY: Glucose-Capillary: 221 mg/dL — ABNORMAL HIGH (ref 70–99)

## 2011-04-25 LAB — LIPASE, BLOOD: Lipase: 15

## 2011-04-25 LAB — AMYLASE: Amylase: 28

## 2011-04-25 LAB — DIFFERENTIAL
Basophils Relative: 1
Eosinophils Absolute: 0.1
Lymphs Abs: 3
Neutro Abs: 3.3
Neutrophils Relative %: 49

## 2011-04-25 LAB — COMPREHENSIVE METABOLIC PANEL
ALT: 17
Alkaline Phosphatase: 55
BUN: 5 — ABNORMAL LOW
CO2: 25
Calcium: 8.5
GFR calc non Af Amer: >60
Glucose, Bld: 89
Sodium: 138

## 2011-04-25 LAB — CBC
HCT: 40.5
Hemoglobin: 13.9
MCHC: 34.3
RBC: 3.91

## 2011-04-26 ENCOUNTER — Encounter (HOSPITAL_COMMUNITY): Payer: Self-pay | Admitting: *Deleted

## 2011-04-26 ENCOUNTER — Emergency Department (HOSPITAL_COMMUNITY)
Admission: EM | Admit: 2011-04-26 | Discharge: 2011-04-27 | Disposition: A | Discharge status: Home / Self Care | Payer: Medicaid Other | Attending: Emergency Medicine | Admitting: Emergency Medicine

## 2011-04-26 DIAGNOSIS — Z79899 Other long term (current) drug therapy: Secondary | ICD-10-CM | POA: Insufficient documentation

## 2011-04-26 DIAGNOSIS — F17200 Nicotine dependence, unspecified, uncomplicated: Secondary | ICD-10-CM | POA: Insufficient documentation

## 2011-04-26 DIAGNOSIS — F319 Bipolar disorder, unspecified: Secondary | ICD-10-CM | POA: Insufficient documentation

## 2011-04-26 DIAGNOSIS — M791 Myalgia, unspecified site: Secondary | ICD-10-CM

## 2011-04-26 DIAGNOSIS — G2581 Restless legs syndrome: Secondary | ICD-10-CM | POA: Insufficient documentation

## 2011-04-26 DIAGNOSIS — IMO0001 Reserved for inherently not codable concepts without codable children: Secondary | ICD-10-CM | POA: Insufficient documentation

## 2011-04-26 DIAGNOSIS — E119 Type 2 diabetes mellitus without complications: Secondary | ICD-10-CM | POA: Insufficient documentation

## 2011-04-26 DIAGNOSIS — M79609 Pain in unspecified limb: Secondary | ICD-10-CM | POA: Insufficient documentation

## 2011-04-26 NOTE — ED Notes (Signed)
Pt reports "severe pain" in both legs starting 1 hr ago, pt also reports she has missed her psy meds tonight

## 2011-04-27 MED ORDER — IBUPROFEN 800 MG PO TABS
800.0000 mg | ORAL_TABLET | Freq: Once | ORAL | Status: AC
  Administered 2011-04-27: 800 mg via ORAL
  Filled 2011-04-27: qty 1

## 2011-04-27 MED ORDER — HYDROCODONE-ACETAMINOPHEN 5-325 MG PO TABS
1.0000 | ORAL_TABLET | Freq: Once | ORAL | Status: AC
  Administered 2011-04-27: 1 via ORAL
  Filled 2011-04-27: qty 1

## 2011-04-27 NOTE — ED Provider Notes (Signed)
History     CSN: [id number] Arrival date & time: 04/26/2011 11:53 PM  Chief Complaint  Patient presents with  . Leg Pain    (Consider location/radiation/quality/duration/timing/severity/associated sxs/prior treatment) HPI Comments: Examined at 34. Patient with h/o bipolar disease and diabetes who clains to have increased restless leg syndrome recently. She stopped her cogentin then stopped her haldol thinking it would help. When neither action decreased the restless legs, she restarted her medicines. She notes that she has been walking more over the last two days to alleviate the pain. Now with pain to both thighs made worse with ambulation.Deneis fever, chills.  Patient is a 45 y.o. female presenting with leg pain. The history is provided by the patient.  Leg Pain  The incident occurred 6 to 12 hours ago (Patient states she has restless leg syndrome which causes her to walk to mnage the pain. She has had increased restless leg syndrome recently.). There was no injury mechanism. The pain is present in the right thigh and left thigh. The quality of the pain is described as aching. The pain is at a severity of 6/10. The pain is moderate. The pain has been constant since onset. Pertinent negatives include no numbness, no inability to bear weight, no loss of motion, no muscle weakness, no loss of sensation and no tingling. The symptoms are aggravated by activity. She has tried nothing for the symptoms.    Past Medical History  Diagnosis Date  . Bipolar 1 disorder   . Diabetes mellitus     Past Surgical History  Procedure Date  . Mandible surgery   . Cholecystectomy     No family history on file.  History  Substance Use Topics  . Smoking status: Current Everyday Smoker -- 0.5 packs/day  . Smokeless tobacco: Not on file  . Alcohol Use: No    OB History    Grav Para Term Preterm Abortions TAB SAB Ect Mult Living   1               Review of Systems  Neurological: Negative for  tingling and numbness.  All other systems reviewed and are negative.    Allergies  Abilify and Procaine hcl  Home Medications   Current Outpatient Rx  Name Route Sig Dispense Refill  . BENZTROPINE MESYLATE 1 MG PO TABS Oral Take 1 mg by mouth 1 day or 1 dose.      Marland Kitchen HALOPERIDOL 5 MG PO TABS Oral Take 5 mg by mouth 1 day or 1 dose.      . INSULIN GLARGINE 100 UNIT/ML [place] SOLN Subcutaneous Inject 25 Units into the skin at bedtime.      Marland Kitchen LISINOPRIL 10 MG PO TABS Oral Take 10 mg by mouth daily.      Marland Kitchen VICKS VAPOR INHALER IN Inhalation Inhale 1 spray into the lungs daily as needed.      Marland Kitchen METFORMIN HCL 500 MG PO TABS Oral Take 1 tablet (500 mg total) by mouth 2 (two) times daily with a meal. 60 tablet 0    BP 149/79  Resp 18  SpO2 98%  Breastfeeding? Unknown  Physical Exam  Nursing note and vitals reviewed. Constitutional: She is oriented to person, place, and time. She appears well-developed and well-nourished.  HENT:  Head: Normocephalic and atraumatic.  Mouth/Throat: Oropharynx is clear and moist.  Eyes: EOM are normal. Pupils are equal, round, and reactive to light.  Neck: Normal range of motion.  Cardiovascular: Normal rate, normal heart sounds and  intact distal pulses.   Pulmonary/Chest: Effort normal and breath sounds normal.  Abdominal: Soft.  Musculoskeletal: Normal range of motion. She exhibits no edema.       Mild tenderness to anterior thighs bilaterally. No weakness. FROM at ankle, knee, hip. Pulses 2+.DTRs 2+.  Neurological: She is alert and oriented to person, place, and time. Coordination normal.  Skin: Skin is warm and dry.    ED Course  Procedures (including critical care time)  Patient with muscle pain to thighs from restless leg syndrome and subsequent walking to alleviate discomfort. Analgesic given while in ER with relief.Pt feels improved after observation and/or treatment in ED. MDM Reviewed: nursing note and vitals           Nicoletta Dress.  Colon Branch, MD 04/27/11 [phone]

## 2011-04-27 NOTE — ED Notes (Signed)
Woody Seller from [facility]  Here to take report from pt at this time

## 2011-04-28 LAB — CBC
RBC: 3.73 — ABNORMAL LOW
WBC: 5.4

## 2011-04-28 LAB — RAPID URINE DRUG SCREEN, HOSP PERFORMED
Barbiturates: NOT DETECTED
Benzodiazepines: POSITIVE — AB
Cocaine: NOT DETECTED
Opiates: POSITIVE — AB

## 2011-04-28 LAB — HCG, QUANTITATIVE, PREGNANCY: hCG, Beta Chain, Quant, S: <2

## 2011-04-28 LAB — BASIC METABOLIC PANEL
Calcium: 9
Creatinine, Ser: 0.63
GFR calc Af Amer: >60
Sodium: 138

## 2011-04-29 LAB — URINALYSIS, ROUTINE W REFLEX MICROSCOPIC
Bilirubin Urine: NEGATIVE
Glucose, UA: NEGATIVE
Glucose, UA: NEGATIVE
Hgb urine dipstick: NEGATIVE
Ketones, ur: NEGATIVE
Ketones, ur: NEGATIVE
Protein, ur: NEGATIVE
Protein, ur: NEGATIVE

## 2011-04-29 LAB — CBC
HCT: 39.6
Hemoglobin: 13.7
MCHC: 34.7
MCV: 100.9 — ABNORMAL HIGH
Platelets: 143 — ABNORMAL LOW
RBC: 3.92
RDW: 12.3
WBC: 6.8

## 2011-04-29 LAB — DIFFERENTIAL
Lymphocytes Relative: 34
Lymphs Abs: 2.3
Monocytes Relative: 4
Neutro Abs: 4.1
Neutrophils Relative %: 61

## 2011-04-29 LAB — BASIC METABOLIC PANEL
Calcium: 8.8
Chloride: 108
Creatinine, Ser: 0.6
GFR calc Af Amer: >60
GFR calc non Af Amer: >60

## 2011-04-29 LAB — URINE CULTURE: Culture: NO GROWTH

## 2011-04-29 LAB — RAPID URINE DRUG SCREEN, HOSP PERFORMED
Amphetamines: NOT DETECTED
Benzodiazepines: POSITIVE — AB

## 2011-04-29 LAB — ETHANOL: Alcohol, Ethyl (B): <5

## 2011-05-02 LAB — URINALYSIS, ROUTINE W REFLEX MICROSCOPIC
Glucose, UA: NEGATIVE
Hgb urine dipstick: NEGATIVE
Specific Gravity, Urine: 1.01

## 2011-05-03 ENCOUNTER — Emergency Department (HOSPITAL_COMMUNITY)
Admission: EM | Admit: 2011-05-03 | Discharge: 2011-05-05 | Disposition: A | Discharge status: Other Acute Inpatient Hospital | Payer: Medicaid Other | Source: Home / Self Care | Attending: Emergency Medicine | Admitting: Emergency Medicine

## 2011-05-03 LAB — PREGNANCY, URINE: Preg Test, Ur: NEGATIVE

## 2011-05-03 LAB — RAPID URINE DRUG SCREEN, HOSP PERFORMED
Amphetamines: NOT DETECTED
Benzodiazepines: POSITIVE — AB
Opiates: NOT DETECTED

## 2011-05-04 LAB — BASIC METABOLIC PANEL
BUN: 5 mg/dL — ABNORMAL LOW (ref 6–23)
CO2: 26 mEq/L (ref 19–32)
Calcium: 8.9 mg/dL (ref 8.4–10.5)
Chloride: 100 mEq/L (ref 96–112)
Creatinine, Ser: <0.47 mg/dL — ABNORMAL LOW (ref 0.50–1.10)
Glucose, Bld: 110 mg/dL — ABNORMAL HIGH (ref 70–99)

## 2011-05-04 LAB — DIFFERENTIAL
Basophils Relative: 1 % (ref 0–1)
Eosinophils Absolute: 0.1 10*3/uL (ref 0.0–0.7)
Lymphs Abs: 2.9 10*3/uL (ref 0.7–4.0)
Monocytes Absolute: 0.5 10*3/uL (ref 0.1–1.0)
Monocytes Relative: 6 % (ref 3–12)
Neutro Abs: 3.7 10*3/uL (ref 1.7–7.7)

## 2011-05-04 LAB — CBC
Hemoglobin: 13.8 g/dL (ref 12.0–15.0)
MCH: 32.5 pg (ref 26.0–34.0)
MCHC: 34.8 g/dL (ref 30.0–36.0)
MCV: 93.4 fL (ref 78.0–100.0)

## 2011-05-04 LAB — ETHANOL: Alcohol, Ethyl (B): <11 mg/dL (ref 0–11)

## 2011-05-05 ENCOUNTER — Inpatient Hospital Stay (HOSPITAL_COMMUNITY)
Admission: AD | Admit: 2011-05-05 | Discharge: 2011-05-09 | DRG: 885 | Disposition: A | Discharge status: Home / Self Care | Payer: Medicaid Other | Attending: Psychiatry | Admitting: Psychiatry

## 2011-05-05 DIAGNOSIS — E119 Type 2 diabetes mellitus without complications: Secondary | ICD-10-CM

## 2011-05-05 DIAGNOSIS — Z91199 Patient's noncompliance with other medical treatment and regimen due to unspecified reason: Secondary | ICD-10-CM

## 2011-05-05 DIAGNOSIS — M549 Dorsalgia, unspecified: Secondary | ICD-10-CM

## 2011-05-05 DIAGNOSIS — E669 Obesity, unspecified: Secondary | ICD-10-CM

## 2011-05-05 DIAGNOSIS — Z9119 Patient's noncompliance with other medical treatment and regimen: Secondary | ICD-10-CM

## 2011-05-05 DIAGNOSIS — I10 Essential (primary) hypertension: Secondary | ICD-10-CM

## 2011-05-05 DIAGNOSIS — IMO0001 Reserved for inherently not codable concepts without codable children: Secondary | ICD-10-CM

## 2011-05-05 DIAGNOSIS — Z794 Long term (current) use of insulin: Secondary | ICD-10-CM

## 2011-05-05 DIAGNOSIS — Z79899 Other long term (current) drug therapy: Secondary | ICD-10-CM

## 2011-05-05 DIAGNOSIS — E876 Hypokalemia: Secondary | ICD-10-CM

## 2011-05-05 DIAGNOSIS — F312 Bipolar disorder, current episode manic severe with psychotic features: Principal | ICD-10-CM

## 2011-05-05 DIAGNOSIS — Z6379 Other stressful life events affecting family and household: Secondary | ICD-10-CM

## 2011-05-05 LAB — GLUCOSE, CAPILLARY: Glucose-Capillary: 93 mg/dL (ref 70–99)

## 2011-05-06 DIAGNOSIS — F22 Delusional disorders: Secondary | ICD-10-CM

## 2011-05-06 LAB — GLUCOSE, CAPILLARY
Glucose-Capillary: 120 mg/dL — ABNORMAL HIGH (ref 70–99)
Glucose-Capillary: 146 mg/dL — ABNORMAL HIGH (ref 70–99)
Glucose-Capillary: 122 mg/dL — ABNORMAL HIGH (ref 70–99)

## 2011-05-07 LAB — GLUCOSE, CAPILLARY
Glucose-Capillary: 126 mg/dL — ABNORMAL HIGH (ref 70–99)
Glucose-Capillary: 135 mg/dL — ABNORMAL HIGH (ref 70–99)

## 2011-05-08 LAB — GLUCOSE, CAPILLARY
Glucose-Capillary: 118 mg/dL — ABNORMAL HIGH (ref 70–99)
Glucose-Capillary: 139 mg/dL — ABNORMAL HIGH (ref 70–99)

## 2011-05-08 NOTE — Discharge Summary (Signed)
NAMEJACEY, [name]             ACCOUNT NO.:  [id number]  MEDICAL RECORD NO.:  [id number]  LOCATION:  0405                          FACILITY:  [facility]  PHYSICIAN:  Eulogio Ditch, MD DATE OF BIRTH:  February 27, 1966  DATE OF ADMISSION:  04/09/2011 DATE OF DISCHARGE:  04/14/2011                              DISCHARGE SUMMARY   IDENTIFYING INFORMATION:  This is a 45 year old female.  This is a voluntary admission.  HISTORY OF PRESENT ILLNESS:  This is 1 of several [facility] admissions for [name] who presented with an exacerbation of psychosis of believing that she was pregnant in spite of the negative pregnancy test, in the emergency room.  She presented by way of our emergency room with an elevated blood glucose with initial random glucose of 433 mg/dL and was found to have a hemoglobin A1c of 12.4.  The she presented initially with disorganized thinking and flight of ideas and was referred for stabilization.  She has a history of bipolar disorder, was previously treated as an outpatient at [facility] in [place].  MEDICAL EVALUATION:  She was medically evaluated in our emergency room. Please refer to the transcript for full physical exam and review of systems.  She was stabilized there.  COURSE OF HOSPITALIZATION:  She was admitted to our acute stabilization unit and followed by our diabetes nurse educator who recommended gradually titrating the Lantus to 15 units daily, and she was placed on NovoLog 4 units t.i.d. with meals, and metformin 500 mg b.i.d., her home medication, was resumed.  She was also started on Haldol 5 mg p.o. at bedtime and Cogentin 1 mg daily.  By the 14th, she appeared pleasant on approach and appeared in full contact with reality.  She was requesting discharge, talking about living with her husband and talked about the fact that they live together in the same home, but no longer had an intimate marriage.  She reported she was  last at [facility] 2 years ago for delusional thinking along with problems with anxiety and was last on Seroquel XR daily and an unknown amount of Klonopin.  She had a couple recent trips to the emergency room for markedly elevated glucose levels.  She was cooperative on exam, but frustrated at being in the hospital and felt ready to leave.  Denied any thoughts of pregnancy.  She planned on returning home and living with her husband.  She was transferred to the service of Dr. Franchot Gallo on April 14, 2011, and at that time, her risk for suicide was deemed to be minimal.  She reported sleeping good with good appetite and felt her depression was at 0.  Denied any suicidal or homicidal thoughts and rated her level of anxiety a 2 on a scale of 1-10 if 10 was the worst anxiety.  DISCHARGE PLAN:  She is to follow up with [facility] on Thursday September 20th at 8 a.m. and follow up appointment was set with her primary care physician.  Her diabetes follow up for September 25th at 10:30 a.m.  DISCHARGE DIAGNOSES:  AXIS I:  Bipolar disorder with psychotic features. AXIS II:  No diagnosis. AXIS III:  Diabetes mellitus, uncontrolled, resolved.  AXIS IV:  Deferred. AXIS V:  Current 55, past year not known.  DISCHARGE CONDITION:  Stable.  DISCHARGE MEDICATIONS: 1. Benztropine 1 mg daily. 2. Haldol 5 mg at bedtime. 3. Lantus insulin 10 units at bedtime. 4. Metformin 500 mg b.i.d.     [name]. [name], N.P.   ______________________________ Eulogio Ditch, MD    MAS/MEDQ  D:  05/06/2011  T:  05/07/2011  Job:  [id number]  Electronically Signed by Kari Baars N.P. on 05/07/2011 11:56:38 AM Electronically Signed by Eulogio Ditch  on 05/08/2011 08:41:13 AM

## 2011-05-08 NOTE — Assessment & Plan Note (Signed)
[name], [name]             ACCOUNT NO.:  [id number]  MEDICAL RECORD NO.:  [id number]  LOCATION:                                 FACILITY:  PHYSICIAN:  Eulogio Ditch, MD DATE OF BIRTH:  07/14/1966  DATE OF ADMISSION:  04/09/2011 DATE OF DISCHARGE:  04/14/2011                      PSYCHIATRIC ADMISSION ASSESSMENT   IDENTIFYING INFORMATION:  A 45 year old female.  This is an involuntary admission.  HISTORY OF THE PRESENT ILLNESS:  This was an involuntary admission for [name] who has a past history of bipolar disorder with psychotic features.  Most recently, she was admitted in August of 2010 for delusional thinking here at [facility]  On this admission, she claimed to be pregnant, believed that she knew the identity of who had murdered a Engineer, materials, and presented displaying flight of ideas with a tangential thought pattern and irrelevant responses and flight of ideas. She has a history of diabetes and was found to have a CBG greater than 500 mg/dL at home when EMS arrived.  PAST PSYCHIATRIC HISTORY:  Previously diagnosed with bipolar disorder with psychotic features.  This is her 9th admission since 2002 to [facility] She was diagnosed 19 years ago by Dr. Thomasena Edis at [facility] and is currently [facility] she [facility] saw a couple of years ago.  PAST MEDICATION TRIALS:  Have included: 1. Klonopin combined with Seroquel XR. 2. Risperdal. 3. Depakote. 4. Paxil. 5. Zyprexa. 6. Haldol 5 mg b.i.d.  SOCIAL HISTORY:  A single female on disability.  Her mother is supportive in the area and is able to transport her to appointments.  No known legal problems.  FAMILY HISTORY:  Father with a history of alcohol abuse.  ALCOHOL AND DRUG HISTORY:  She reports using marijuana twice a week.  MEDICAL HISTORY:  Primary care physician is [employer] in [place], [place].  MEDICAL PROBLEMS: 1. Irritable bowel syndrome. 2.  Diabetes mellitus, type 2. 3. She has a history of urinary retention.  CURRENT MEDICATIONS:  Metformin 500 mg b.i.d.  DRUG ALLERGIES: 1. PROCAINE. 2. ABILIFY.  PHYSICAL EXAM:  Was done in the emergency room along with review of systems and is noted in the record.  This is a normally developed female who presented in no acute physical distress.  DIAGNOSTIC STUDIES:  Revealed a hemoglobin A1c of 12.4, a random glucose of 433 on initial presentation.  Urine drug screen positive for benzodiazepines and cannabis metabolites.  Basic chemistry, sodium 132, potassium 3.3, chloride 94, carbon dioxide 27, BUN 4, creatinine 0.56, random glucose 433.  She is 5 feet 4 inches tall and weighs 238 pounds. Hepatic function panel, AST 41, ALT 36, alkaline phosphatase 181, and total bilirubin 0.5.  MENTAL STATUS EXAM:  Fully alert female, responded immediately to her name, having delusional thoughts that she is pregnant, believes she is pregnant.  No evidence of suicidality.  Expresses no dangerous ideas or aggression towards anyone else.  She did not appear to be hallucinating. Oriented to person and basic situation, not well oriented to time frames.  AXIS I:  Rule out bipolar disorder, manic, with psychotic features. AXIS II:  Deferred. Axis III:  Diabetes mellitus type 2, controlled.  AXIS IV:  Deferred. AXIS V:  Current 38, past year not known.  PLAN:  Admit her to our acute stabilization unit.  She has given [place] permission to speak with family members.  We are going to repeat a CMET on her, get a diabetes consult.  We will add Lantus insulin on a regular basis and continue Haldol 5 mg q.h.s. and 5 mg every 6 hours p.r.n. for agitation or acute psychosis.     [name], N.P.   ______________________________ Eulogio Ditch, MD    MAS/MEDQ  D:  05/06/2011  T:  05/07/2011  Job:  [id number]  Electronically Signed by Kari Baars N.P. on 05/07/2011 11:56:42 AM Electronically  Signed by Eulogio Ditch  on 05/08/2011 08:41:16 AM

## 2011-05-09 LAB — GLUCOSE, CAPILLARY

## 2011-05-13 LAB — URINE MICROSCOPIC-ADD ON

## 2011-05-13 LAB — URINALYSIS, ROUTINE W REFLEX MICROSCOPIC
Bilirubin Urine: NEGATIVE
Specific Gravity, Urine: 1.01
pH: 6

## 2011-05-13 NOTE — Assessment & Plan Note (Signed)
NAMEJEANINNE, LODICO             ACCOUNT NO.:  [id number]  MEDICAL RECORD NO.:  [id number]  LOCATION:  0402                          FACILITY:  [facility]  PHYSICIAN:  Eulogio Ditch, MD DATE OF BIRTH:  [birth year]  DATE OF ADMISSION:  05/05/2011 DATE OF DISCHARGE:                      PSYCHIATRIC ADMISSION ASSESSMENT   This is an involuntary admission to the services of Dr. Rogers Blocker.  She came to [facility] on involuntary commitment papers. Apparently her mother had taken these out.  Apparently the mother feels that she is a danger to herself and others, that she is responding to internal stimuli and has been noncompliant with her medications since her last admission here.  She was just here September 12 to September 17.  Unfortunately, there are 2 different chart numbers that I do not have as we are in the process of changing medical record systems and do not have access to the old records, but essentially today, when she presented to the emergency room, she was manic, she was constantly talking and unable to distinguish delusion from reality.  Hence telepsych was ordered.  The telepsych people felt that she was acting bizarre, delusional, and was probably abusing drugs.  Her urine drug screen was positive for marijuana and benzodiazepines.  She says tonight that the benzodiazepines were given to her at the hospital.  As she was felt to not be psychiatrically stable, it was recommended that inpatient care be obtained.  PAST PSYCHIATRIC HISTORY:  As already stated, she was just here September 12 to September 17.  She was with [place] in 2010 as well.  She is normally followed as an outpatient at [facility]  SOCIAL HISTORY:  She reports that she has a GED.  Although she walked down the aisle with this man, he is not who he says he is and hence she is not married.  She denies any children.  She thinks she is 7 months pregnant despite a urine pregnancy test that was negative,  and she does take prenatal vitamins.  FAMILY HISTORY:  Her father was an alcoholic.  ALCOHOL AND DRUG HISTORY:  She does have some past history for cocaine use.  There does not seem to be any recent cocaine use.  PRIMARY CARE PHYSICIAN:  Dr. Mikki Santee.  She is followed as an outpatient at [facility]  She is supposed to see Dr. __________.  MEDICAL PROBLEMS:  She is known to have diabetes, fibromyalgia, back pain.  She is grossly overweight.  She states that she was diagnosed as bipolar at age 45 or 45.  MEDICATIONS:  Her medications at the time of her last discharge: 1. Benztropine 1 mg p.o. daily. 2. Haldol 5 mg at h.s. 3. Lantus insulin 10 units at h.s. 4. Metformin 500 mg p.o. b.i.d.  ALLERGIES:  She states that Abilify caused her legs to swell.  Now she is claiming that Haldol did the same thing.  POSITIVE PHYSICAL FINDINGS:  She was medically cleared in the ED at [facility].  She was afebrile.  Her temperature was 98.1 to 98.5.  Her pulse was 74 to 99.  Her blood pressure was 100/67 to 142/100.  As already stated, her UDS was positive for benzodiazepines and marijuana.  Her urine pregnancy test was negative.  She had no remarkable findings on her CBC.  Her potassium was slightly low at 3.2.  Glucose was slightly high at 110.  MENTAL STATUS EXAM:  Tonight she is alert.  She is superficially oriented.  She is casually groomed and dressed in a hospital gown.  Her speech is increased although it is not pressured.  She is somewhat circumstantial and tangential.  Thought processes are not completely clear, rational or goal oriented.  Judgment and insight are poor. Concentration and memory are superficially intact.  Intelligence is average.  DIAGNOSIS:  Axis I:  Bipolar disorder, manic, severe, with psychotic features. Axis II:  Deferred. Axis III:  Diabetes, fibromyalgia, back pain, obesity. Axis IV:  Severe persistent mental illness and noncompliance with medications. Axis V:   30.  PLAN:  The plan is to admit for safety and stabilization.  We will get her hopefully started on a depo medication so compliance will be less of an issue, and she already has care in the community once stabilized.     Mickie Leonarda Salon, P.A.-C.   ______________________________ Eulogio Ditch, MD    MD/MEDQ  D:  05/05/2011  T:  05/06/2011  Job:  [id number]  Electronically Signed by Jaci Lazier ADAMS P.A.-C. on 05/12/2011 11:48:37 AM Electronically Signed by Eulogio Ditch  on 05/13/2011 09:56:21 AM

## 2011-05-15 LAB — ETHANOL: Alcohol, Ethyl (B): <5

## 2011-05-15 LAB — BASIC METABOLIC PANEL
BUN: 4 — ABNORMAL LOW
Chloride: 107
Creatinine, Ser: 0.67
GFR calc non Af Amer: >60
Glucose, Bld: 114 — ABNORMAL HIGH

## 2011-05-15 LAB — URINE MICROSCOPIC-ADD ON

## 2011-05-15 LAB — URINALYSIS, ROUTINE W REFLEX MICROSCOPIC
Glucose, UA: NEGATIVE
Specific Gravity, Urine: 1.01

## 2011-05-15 LAB — RAPID URINE DRUG SCREEN, HOSP PERFORMED
Barbiturates: NOT DETECTED
Opiates: NOT DETECTED

## 2011-05-15 LAB — DIFFERENTIAL
Basophils Absolute: 0
Basophils Relative: 1
Eosinophils Relative: 2
Lymphocytes Relative: 29
Monocytes Absolute: 0.3
Monocytes Relative: 5

## 2011-05-15 LAB — CBC
MCV: 91.5
Platelets: 180
RDW: 12.9
WBC: 7

## 2011-05-21 NOTE — Discharge Summary (Signed)
[name], [name]             ACCOUNT NO.:  [id number]  MEDICAL RECORD NO.:  [id number]  LOCATION:  0402                          FACILITY:  [facility]  PHYSICIAN:  Eulogio Ditch, MD DATE OF BIRTH:  06/08/1966  DATE OF ADMISSION:  05/05/2011 DATE OF DISCHARGE:  05/09/2011                              DISCHARGE SUMMARY   IDENTIFYING INFORMATION:  This is a 45 year old Caucasian female.  This is an involuntary admission.  HISTORY OF PRESENT ILLNESS:  [name] presents by way of [facility] Emergency Room on involuntary petition papers taken out by her mother.  Her mother was worried that she was a danger to herself, felt that she was internally distracted, and had not been taking her medications since her last discharge from [facility] on September 17th.  She has displayed some delusional thinking which she has expressed in the past.  Apparently making statements that she believes that she is pregnant.  When in fact she is not as validated by diagnostic testing.  She appeared internally distracted to her family and that they were also concerned that she had not been taking her insulin required for her diabetes.  She was initially evaluated at [facility] and ultimately referred to [facility].  MEDICAL EVALUATION:  She was medically evaluated in the emergency room.  CHRONIC MEDICAL PROBLEMS:  Are diabetes type 2, on Lantus insulin, fibromyalgia, and back pain.  Vital signs were normal in the emergency room.  Urine drug screen positive for benzodiazepines and cannabis. Urine pregnancy test was negative.  CBC normal.  Chemistry revealed a mildly decreased potassium at 3.2, random glucose at 110 mg/dL.  COURSE OF HOSPITALIZATION:  She was admitted to our Acute Stabilization Unit where she presented fully alert, casually groomed, normal speech. Although somewhat hyperverbal it was non-pressured.  Thinking circumferential and tangential.  Judgment  and insight poor. Concentration and memory partial.  Intelligence average.  Denying any dangerous thoughts.  She was given a provisional diagnosis of bipolar disorder, manic, with psychotic features.  She was restarted on her routine home medications and received regular checks on her capillary glucose.  She was started on Haldol 5 mg p.o. at bedtime which she had received during her previous stay and 5 mg q.6 hours p.r.n. for agitation.  She was cooperative while on the unit, and did not require any restraints or restrictions.  She was subsequently switched to Risperdal 2 mg p.o. at bedtime because she voiced a previous allergy to Haldol.  She did well on the Risperdal.  She also said that she believed that the Haldol had made her legs hurt and that was another reason why she did not want to take it.  She had also discontinued her insulin several days earlier, according to her mother.  According to her mother, she was developing delusional thoughts about her husband and she went to take out a 50B restraining order on him.  The mother reported instead the 50B restraining order was actually on her and she would not be able to return to the home.  Ultimately, she did well on the Risperdal and agreed to take this at home and her husband was agreeable with her coming  back and felt that the restraining order was delusional. [name] herself was in agreement with going back home as her thinking cleared and her insight improved.  She was compliant with medications while here, and, by the 12th, her risk of suicide was gauged minimal. She had no dangerous ideas.  DISCHARGE PLAN:  Follow up with [facility] on October 16th at 8 a.m.  DISCHARGE DIAGNOSES:  Axis I:  Bipolar disorder, hypomanic, rule out psychotic features. Axis II:  Deferred. Axis III:  Diabetes mellitus type 2, stable. Axis IV:  Deferred. Axis V:  Current 55, past year not known.  DISCHARGE MEDICATIONS:   Benadryl 50 mg at bedtime. 1. Risperdal 2 mg at bedtime. 2. Lantus insulin 25 units at bedtime. 3. Lisinopril 10 mg daily. 4. Metformin 500 mg daily. 5. Vitamin 1 daily.     Margaret A. Lorin Picket, N.P.   ______________________________ Eulogio Ditch, MD    MAS/MEDQ  D:  05/12/2011  T:  05/12/2011  Job:  [id number]  Electronically Signed by Kari Baars N.P. on 05/13/2011 01:05:24 PM Electronically Signed by Eulogio Ditch  on 05/21/2011 02:26:08 PM

## 2011-10-14 ENCOUNTER — Emergency Department (HOSPITAL_COMMUNITY)
Admission: EM | Admit: 2011-10-14 | Discharge: 2011-10-14 | Disposition: A | Discharge status: Home / Self Care | Payer: Medicaid Other | Attending: Emergency Medicine | Admitting: Emergency Medicine

## 2011-10-14 ENCOUNTER — Encounter (HOSPITAL_COMMUNITY): Payer: Self-pay

## 2011-10-14 DIAGNOSIS — E119 Type 2 diabetes mellitus without complications: Secondary | ICD-10-CM | POA: Insufficient documentation

## 2011-10-14 DIAGNOSIS — F17200 Nicotine dependence, unspecified, uncomplicated: Secondary | ICD-10-CM | POA: Insufficient documentation

## 2011-10-14 DIAGNOSIS — R197 Diarrhea, unspecified: Secondary | ICD-10-CM | POA: Insufficient documentation

## 2011-10-14 DIAGNOSIS — R110 Nausea: Secondary | ICD-10-CM | POA: Insufficient documentation

## 2011-10-14 DIAGNOSIS — R109 Unspecified abdominal pain: Secondary | ICD-10-CM

## 2011-10-14 DIAGNOSIS — F319 Bipolar disorder, unspecified: Secondary | ICD-10-CM | POA: Insufficient documentation

## 2011-10-14 MED ORDER — SODIUM CHLORIDE 0.9 % IV BOLUS (SEPSIS)
1000.0000 mL | Freq: Once | INTRAVENOUS | Status: AC
  Administered 2011-10-14: 1000 mL via INTRAVENOUS

## 2011-10-14 MED ORDER — LORAZEPAM 2 MG/ML IJ SOLN
1.0000 mg | Freq: Once | INTRAMUSCULAR | Status: AC
  Administered 2011-10-14: 1 mg via INTRAVENOUS
  Filled 2011-10-14: qty 1

## 2011-10-14 MED ORDER — HYDROMORPHONE HCL PF 1 MG/ML IJ SOLN
1.0000 mg | Freq: Once | INTRAMUSCULAR | Status: AC
  Administered 2011-10-14: 1 mg via INTRAVENOUS
  Filled 2011-10-14: qty 1

## 2011-10-14 MED ORDER — ONDANSETRON HCL 4 MG/2ML IJ SOLN
4.0000 mg | Freq: Once | INTRAMUSCULAR | Status: AC
  Administered 2011-10-14: 4 mg via INTRAVENOUS
  Filled 2011-10-14: qty 2

## 2011-10-14 MED ORDER — PROMETHAZINE HCL 25 MG PO TABS: 12.5000 mg | ORAL_TABLET | Freq: Four times a day (QID) | ORAL | Status: DC | PRN

## 2011-10-14 MED ORDER — SODIUM CHLORIDE 0.9 % IV SOLN
Freq: Once | INTRAVENOUS | Status: AC
  Administered 2011-10-14: 07:00:00 via INTRAVENOUS

## 2011-10-14 MED ORDER — PROMETHAZINE HCL 25 MG/ML IJ SOLN
12.5000 mg | Freq: Once | INTRAMUSCULAR | Status: AC
  Administered 2011-10-14: 12.5 mg via INTRAVENOUS
  Filled 2011-10-14: qty 1

## 2011-10-14 MED ORDER — HYDROCODONE-ACETAMINOPHEN 5-325 MG PO TABS: 1.0000 | ORAL_TABLET | ORAL | Status: AC | PRN

## 2011-10-14 NOTE — Discharge Instructions (Signed)
Use the medicines as directed. Follow up with your doctor.

## 2011-10-14 NOTE — ED Provider Notes (Signed)
History     CSN: [id number]  Arrival date & time 10/14/11  0302   First MD Initiated Contact with Patient 10/14/11 0404      Chief Complaint  Patient presents with  . Abdominal Pain  . Diarrhea    (Consider location/radiation/quality/duration/timing/severity/associated sxs/prior treatment) HPI [name] is a 46 y.o. female who presents to the Emergency Department complaining of cramping abdominal pain without vomiting or diarrhea for several days.Denies fever, chills, back pain, cough.Has taken no medicines.  Past Medical History  Diagnosis Date  . Bipolar 1 disorder   . Diabetes mellitus     Past Surgical History  Procedure Date  . Mandible surgery   . Cholecystectomy     History reviewed. No pertinent family history.  History  Substance Use Topics  . Smoking status: Current Everyday Smoker -- 0.5 packs/day  . Smokeless tobacco: Not on file  . Alcohol Use: No    OB History    Grav Para Term Preterm Abortions TAB SAB Ect Mult Living   1               Review of Systems  Constitutional: Negative for fever.       10 Systems reviewed and are negative for acute change except as noted in the HPI.  HENT: Negative for congestion.   Eyes: Negative for discharge and redness.  Respiratory: Negative for cough and shortness of breath.   Cardiovascular: Negative for chest pain.  Gastrointestinal: Positive for abdominal pain. Negative for vomiting, blood in stool and abdominal distention.  Musculoskeletal: Negative for back pain.  Skin: Negative for rash.  Neurological: Negative for syncope, numbness and headaches.  Psychiatric/Behavioral:       No behavior change.    Allergies  Abilify; Cogentin; Haldol; and Procaine hcl  Home Medications   Current Outpatient Rx  Name Route Sig Dispense Refill  . INSULIN GLARGINE 100 UNIT/ML [place] SOLN Subcutaneous Inject 25 Units into the skin at bedtime.      Marland Kitchen LISINOPRIL 10 MG PO TABS Oral Take 10 mg by mouth daily.        Marland Kitchen METFORMIN HCL 500 MG PO TABS Oral Take 1 tablet (500 mg total) by mouth 2 (two) times daily with a meal. 60 tablet 0  . VICKS VAPOR INHALER IN Inhalation Inhale 1 spray into the lungs daily as needed.      Marland Kitchen BENZTROPINE MESYLATE 1 MG PO TABS Oral Take 1 mg by mouth 1 day or 1 dose.      Marland Kitchen HALOPERIDOL 5 MG PO TABS Oral Take 5 mg by mouth 1 day or 1 dose.      Marland Kitchen HYDROCODONE-ACETAMINOPHEN 5-325 MG PO TABS Oral Take 1 tablet by mouth every 4 (four) hours as needed for pain. 15 tablet 0  . PROMETHAZINE HCL 25 MG PO TABS Oral Take 0.5 tablets (12.5 mg total) by mouth every 6 (six) hours as needed for nausea. 10 tablet 0    BP 124/73  Pulse 114  Temp(Src) 97.9 F (36.6 C) (Oral)  Resp 18  Ht 5\' 1"  (1.549 m)  Wt 232 lb (105.235 kg)  BMI 43.84 kg/m2  SpO2 100%  Physical Exam  Nursing note and vitals reviewed. Constitutional:       Awake, alert, nontoxic appearance.  HENT:  Head: Atraumatic.  Eyes: Right eye exhibits no discharge. Left eye exhibits no discharge.  Neck: Neck supple.  Pulmonary/Chest: Effort normal. She exhibits no tenderness.  Abdominal: Soft. Bowel sounds are normal. She  exhibits no mass. There is no tenderness. There is no rebound and no guarding.  Musculoskeletal: She exhibits no tenderness.       Baseline ROM, no obvious new focal weakness.  Neurological:       Mental status and motor strength appears baseline for patient and situation.  Skin: No rash noted.  Psychiatric: She has a normal mood and affect.    ED Course  Procedures (including critical care time)    1. Abdominal  pain, other specified site   2. Nausea       MDM  Patient with abdominal pain. Given IVF, analgesic, antiemetic with some relief. Able to take PO fluids. Pt stable in ED with no significant deterioration in condition.The patient appears reasonably screened and/or stabilized for discharge and I doubt any other medical condition or other [facility] requiring further screening, evaluation, or  treatment in the ED at this time prior to discharge.  MDM Reviewed: nursing note and vitals          Nicoletta Dress. Colon Branch, MD 10/14/11 1100

## 2011-10-14 NOTE — ED Notes (Signed)
Diarrhea, nausea, no vomiting.   Abd pains, unable to eat anything for several days.

## 2011-10-22 ENCOUNTER — Emergency Department (HOSPITAL_COMMUNITY)
Admission: EM | Admit: 2011-10-22 | Discharge: 2011-10-22 | Disposition: A | Discharge status: Home / Self Care | Payer: Medicaid Other | Attending: Emergency Medicine | Admitting: Emergency Medicine

## 2011-10-22 ENCOUNTER — Encounter (HOSPITAL_COMMUNITY): Payer: Self-pay | Admitting: *Deleted

## 2011-10-22 DIAGNOSIS — M549 Dorsalgia, unspecified: Secondary | ICD-10-CM | POA: Insufficient documentation

## 2011-10-22 DIAGNOSIS — F3289 Other specified depressive episodes: Secondary | ICD-10-CM | POA: Insufficient documentation

## 2011-10-22 DIAGNOSIS — F411 Generalized anxiety disorder: Secondary | ICD-10-CM | POA: Insufficient documentation

## 2011-10-22 DIAGNOSIS — E119 Type 2 diabetes mellitus without complications: Secondary | ICD-10-CM | POA: Insufficient documentation

## 2011-10-22 DIAGNOSIS — F329 Major depressive disorder, single episode, unspecified: Secondary | ICD-10-CM | POA: Insufficient documentation

## 2011-10-22 DIAGNOSIS — G8929 Other chronic pain: Secondary | ICD-10-CM | POA: Insufficient documentation

## 2011-10-22 DIAGNOSIS — Z794 Long term (current) use of insulin: Secondary | ICD-10-CM | POA: Insufficient documentation

## 2011-10-22 DIAGNOSIS — I10 Essential (primary) hypertension: Secondary | ICD-10-CM | POA: Insufficient documentation

## 2011-10-22 HISTORY — DX: Fibromyalgia: M79.7

## 2011-10-22 HISTORY — DX: Essential (primary) hypertension: I10

## 2011-10-22 MED ORDER — HYDROCODONE-ACETAMINOPHEN 5-325 MG PO TABS: 1.0000 | ORAL_TABLET | ORAL | Status: DC | PRN

## 2011-10-22 MED ORDER — ONDANSETRON HCL 4 MG PO TABS: ORAL_TABLET | ORAL | Status: DC

## 2011-10-22 NOTE — ED Notes (Signed)
Pt presents with chronic back pain. Denies injury at this time.

## 2011-10-22 NOTE — ED Provider Notes (Signed)
History     CSN: [id number]  Arrival date & time 10/22/11  1054   First MD Initiated Contact with Patient 10/22/11 1144      Chief Complaint  Patient presents with  . Back Pain    (Consider location/radiation/quality/duration/timing/severity/associated sxs/prior treatment) HPI Comments: This patient has a history of chronic back pain and fibromyalgia. She states that she is in the midst of changing positions. The most recent physician told her that he could not help her with her chronic back pain with narcotic medication. She is on now attempting to find another physician who will take care of her chronic pain. In the interim the patient states she is out of pain medication and request assistance with this problem from the emergency department. They have been under changes. The patient denies any new injuries. There been no new complaints or changes changes other than increase in pain.  Patient is a 46 y.o. female presenting with back pain. The history is provided by the patient.  Back Pain  Pertinent negatives include no chest pain, no abdominal pain and no dysuria.    Past Medical History  Diagnosis Date  . Bipolar 1 disorder   . Diabetes mellitus   . Hypertension   . Fibromyalgia     Past Surgical History  Procedure Date  . Mandible surgery   . Cholecystectomy     No family history on file.  History  Substance Use Topics  . Smoking status: Current Everyday Smoker -- 0.5 packs/day  . Smokeless tobacco: Not on file  . Alcohol Use: No    OB History    Grav Para Term Preterm Abortions TAB SAB Ect Mult Living   1               Review of Systems  Constitutional: Negative for activity change.       All ROS Neg except as noted in HPI  HENT: Negative for nosebleeds and neck pain.   Eyes: Negative for photophobia and discharge.  Respiratory: Negative for cough, shortness of breath and wheezing.   Cardiovascular: Negative for chest pain and palpitations.    Gastrointestinal: Negative for abdominal pain and blood in stool.  Genitourinary: Negative for dysuria, frequency and hematuria.  Musculoskeletal: Positive for myalgias, back pain and arthralgias.  Skin: Negative.   Neurological: Negative for dizziness, seizures and speech difficulty.  Psychiatric/Behavioral: Negative for hallucinations and confusion. The patient is nervous/anxious.        Depression    Allergies  Abilify; Cogentin; Haldol; and Procaine hcl  Home Medications   Current Outpatient Rx  Name Route Sig Dispense Refill  . VICKS VAPOR INHALER IN Inhalation Inhale 1 spray into the lungs daily as needed.      Marland Kitchen BENZTROPINE MESYLATE 1 MG PO TABS Oral Take 1 mg by mouth 1 day or 1 dose.      Marland Kitchen HALOPERIDOL 5 MG PO TABS Oral Take 5 mg by mouth 1 day or 1 dose.      Marland Kitchen HYDROCODONE-ACETAMINOPHEN 5-325 MG PO TABS Oral Take 1 tablet by mouth every 4 (four) hours as needed for pain. 15 tablet 0  . HYDROCODONE-ACETAMINOPHEN 5-325 MG PO TABS Oral Take 1 tablet by mouth every 4 (four) hours as needed for pain. 20 tablet 0  . INSULIN GLARGINE 100 UNIT/ML [place] SOLN Subcutaneous Inject 25 Units into the skin at bedtime.      Marland Kitchen LISINOPRIL 10 MG PO TABS Oral Take 10 mg by mouth daily.      Marland Kitchen  METFORMIN HCL 500 MG PO TABS Oral Take 1 tablet (500 mg total) by mouth 2 (two) times daily with a meal. 60 tablet 0  . PROMETHAZINE HCL 25 MG PO TABS Oral Take 0.5 tablets (12.5 mg total) by mouth every 6 (six) hours as needed for nausea. 10 tablet 0    BP 117/74  Pulse 98  Temp(Src) 98.1 F (36.7 C) (Oral)  Resp 22  Ht 5\' 1"  (1.549 m)  Wt 232 lb (105.235 kg)  BMI 43.84 kg/m2  SpO2 100%  LMP 05/24/2011  Physical Exam  Nursing note and vitals reviewed. Constitutional: She is oriented to person, place, and time. She appears well-developed and well-nourished.  Non-toxic appearance.  HENT:  Head: Normocephalic.  Right Ear: Tympanic membrane and external ear normal.  Left Ear: Tympanic membrane and  external ear normal.  Eyes: EOM and lids are normal. Pupils are equal, round, and reactive to light.  Neck: Normal range of motion. Neck supple. Carotid bruit is not present.  Cardiovascular: Normal rate, regular rhythm, normal heart sounds, intact distal pulses and normal pulses.   Pulmonary/Chest: Breath sounds normal. No respiratory distress.  Abdominal: Soft. Bowel sounds are normal. There is no tenderness. There is no guarding.  Musculoskeletal: Normal range of motion.       No palpable deformity of the lower back. Pain to palpation of the lower lumbar area.  Lymphadenopathy:       Head (right side): No submandibular adenopathy present.       Head (left side): No submandibular adenopathy present.    She has no cervical adenopathy.  Neurological: She is alert and oriented to person, place, and time. She has normal strength. No cranial nerve deficit or sensory deficit. She exhibits normal muscle tone. Coordination normal.  Skin: Skin is warm and dry.  Psychiatric: She has a normal mood and affect. Her speech is normal.    ED Course  Procedures (including critical care time)  Labs Reviewed - No data to display No results found.   1. Back pain, chronic       MDM  I have reviewed nursing notes, vital signs, and all appropriate lab and imaging results for this patient. Patient has history of chronic back pain. She has history of fibromyalgia. She is in the midst of changing positions, and requests assistance with pain while she's making the transition. No new neurologic deficits appreciated at this time. A pain management specialist, and the orthopedic surgeon on for the most common system where given to the patient at her request. Prescription for Norco 5 mg #20 tablets given to the patient.       Kathie Dike, [place] 10/22/11 1204

## 2011-10-22 NOTE — ED Notes (Signed)
Pt states is here for chronic back pain. was here 8 days prior for same c/o per pt.  Denies injury, loss of bowel/bladder incontinent and radiating pain. Pt states is 6 months pregnant.

## 2011-10-22 NOTE — Discharge Instructions (Signed)
Please alternate heat and ice to your lower back. Dr Weldon Inches [phone]) is a pain management specialist that may be of help to your. Dr Jerl Santos is the orthopedic MD on call for the [facility].Chronic Back Pain When back pain lasts longer than 3 months, it is called chronic back pain.This pain can be frustrating, but the cause of the pain is rarely dangerous.People with chronic back pain often go through certain periods that are more intense (flare-ups). CAUSES Chronic back pain can be caused by wear and tear (degeneration) on different structures in your back. These structures may include bones, ligaments, or discs. This degeneration may result in more pressure being placed on the nerves that travel to your legs and feet. This can lead to pain traveling from the low back down the back of the legs. When pain lasts longer than 3 months, it is not unusual for people to experience anxiety or depression. Anxiety and depression can also contribute to low back pain. TREATMENT  Establish a regular exercise plan. This is critical to improving your functional level.   Have a self-management plan for when you flare-up. Flare-ups rarely require a medical visit. Regular exercise will help reduce the intensity and frequency of your flare-ups.   Manage how you feel about your back pain and the rest of your life. Anxiety, depression, and feeling that you cannot alter your back pain have been shown to make back pain more intense and debilitating.   Medicines should never be your only treatment. They should be used along with other treatments to help you return to a more active lifestyle.   Procedures such as injections or surgery may be helpful but are rarely necessary. You may be able to get the same results with physical therapy or chiropractic care.  HOME CARE INSTRUCTIONS  Avoid bending, heavy lifting, prolonged sitting, and activities which make the problem worse.   Continue normal activity as  much as possible.   Take brief periods of rest throughout the day to reduce your pain during flare-ups.   Follow your back exercise rehabilitation program. This can help reduce symptoms and prevent more pain.   Only take over-the-counter or prescription medicines as directed by your caregiver. Muscle relaxants are sometimes prescribed. Narcotic pain medicine is discouraged for long-term pain, since addiction is a possible outcome.   If you smoke, quit.   Eat healthy foods and maintain a recommended body weight.  SEEK IMMEDIATE MEDICAL CARE IF:   You have weakness or numbness in one of your legs or feet.   You have trouble controlling your bladder or bowels.   You develop nausea, vomiting, abdominal pain, shortness of breath, or fainting.  Document Released: 08/21/2004 Document Revised: 07/03/2011 Document Reviewed: 06/28/2011 [facility] Patient Information 2012 [place], [place].

## 2011-10-23 NOTE — ED Provider Notes (Signed)
Medical screening examination/treatment/procedure(s) were performed by non-physician practitioner and as supervising physician I was immediately available for consultation/collaboration.  Donnetta Hutching, MD 10/23/11 1000

## 2011-10-25 ENCOUNTER — Emergency Department (HOSPITAL_COMMUNITY)
Admission: EM | Admit: 2011-10-25 | Discharge: 2011-10-26 | Disposition: A | Discharge status: Home / Self Care | Payer: Medicaid Other | Attending: Emergency Medicine | Admitting: Emergency Medicine

## 2011-10-25 DIAGNOSIS — E119 Type 2 diabetes mellitus without complications: Secondary | ICD-10-CM | POA: Insufficient documentation

## 2011-10-25 DIAGNOSIS — F319 Bipolar disorder, unspecified: Secondary | ICD-10-CM | POA: Insufficient documentation

## 2011-10-25 DIAGNOSIS — F17200 Nicotine dependence, unspecified, uncomplicated: Secondary | ICD-10-CM | POA: Insufficient documentation

## 2011-10-25 DIAGNOSIS — M545 Low back pain, unspecified: Secondary | ICD-10-CM | POA: Insufficient documentation

## 2011-10-25 DIAGNOSIS — IMO0001 Reserved for inherently not codable concepts without codable children: Secondary | ICD-10-CM | POA: Insufficient documentation

## 2011-10-25 DIAGNOSIS — Z794 Long term (current) use of insulin: Secondary | ICD-10-CM | POA: Insufficient documentation

## 2011-10-25 DIAGNOSIS — Z79899 Other long term (current) drug therapy: Secondary | ICD-10-CM | POA: Insufficient documentation

## 2011-10-25 DIAGNOSIS — R109 Unspecified abdominal pain: Secondary | ICD-10-CM | POA: Insufficient documentation

## 2011-10-25 DIAGNOSIS — R112 Nausea with vomiting, unspecified: Secondary | ICD-10-CM | POA: Insufficient documentation

## 2011-10-25 DIAGNOSIS — I10 Essential (primary) hypertension: Secondary | ICD-10-CM | POA: Insufficient documentation

## 2011-10-26 ENCOUNTER — Encounter (HOSPITAL_COMMUNITY): Payer: Self-pay | Admitting: Emergency Medicine

## 2011-10-26 LAB — URINALYSIS, ROUTINE W REFLEX MICROSCOPIC
Glucose, UA: NEGATIVE mg/dL
Ketones, ur: NEGATIVE mg/dL
Leukocytes, UA: NEGATIVE
Nitrite: NEGATIVE
Specific Gravity, Urine: 1.01 (ref 1.005–1.030)
pH: 7 (ref 5.0–8.0)

## 2011-10-26 LAB — DIFFERENTIAL
Basophils Absolute: 0 10*3/uL (ref 0.0–0.1)
Basophils Relative: 1 % (ref 0–1)
Eosinophils Absolute: 0.1 10*3/uL (ref 0.0–0.7)
Monocytes Absolute: 0.4 10*3/uL (ref 0.1–1.0)
Monocytes Relative: 6 % (ref 3–12)
Neutro Abs: 2.7 10*3/uL (ref 1.7–7.7)

## 2011-10-26 LAB — COMPREHENSIVE METABOLIC PANEL
AST: 22 U/L (ref 0–37)
Albumin: 3.6 g/dL (ref 3.5–5.2)
BUN: 8 mg/dL (ref 6–23)
Calcium: 9.5 mg/dL (ref 8.4–10.5)
Chloride: 100 mEq/L (ref 96–112)
Creatinine, Ser: 0.66 mg/dL (ref 0.50–1.10)
Total Bilirubin: 0.5 mg/dL (ref 0.3–1.2)
Total Protein: 7.6 g/dL (ref 6.0–8.3)

## 2011-10-26 LAB — CBC
HCT: 39.9 % (ref 36.0–46.0)
Hemoglobin: 13.9 g/dL (ref 12.0–15.0)
MCH: 33.4 pg (ref 26.0–34.0)
MCHC: 34.8 g/dL (ref 30.0–36.0)
RDW: 12.7 % (ref 11.5–15.5)

## 2011-10-26 LAB — LIPASE, BLOOD: Lipase: 14 U/L (ref 11–59)

## 2011-10-26 MED ORDER — HYDROCODONE-ACETAMINOPHEN 5-325 MG PO TABS: 1.0000 | ORAL_TABLET | ORAL | Status: AC | PRN

## 2011-10-26 MED ORDER — METOCLOPRAMIDE HCL 5 MG/ML IJ SOLN
10.0000 mg | Freq: Once | INTRAMUSCULAR | Status: AC
  Administered 2011-10-26: 10 mg via INTRAVENOUS
  Filled 2011-10-26: qty 2

## 2011-10-26 MED ORDER — METOCLOPRAMIDE HCL 10 MG PO TABS: 10.0000 mg | ORAL_TABLET | Freq: Four times a day (QID) | ORAL | Status: DC | PRN

## 2011-10-26 MED ORDER — DIPHENHYDRAMINE HCL 50 MG/ML IJ SOLN
25.0000 mg | Freq: Once | INTRAMUSCULAR | Status: AC
  Administered 2011-10-26: 25 mg via INTRAVENOUS
  Filled 2011-10-26: qty 1

## 2011-10-26 MED ORDER — SODIUM CHLORIDE 0.9 % IV SOLN
Freq: Once | INTRAVENOUS | Status: AC
  Administered 2011-10-26: 150 mL/h via INTRAVENOUS

## 2011-10-26 MED ORDER — SODIUM CHLORIDE 0.9 % IV BOLUS (SEPSIS)
1000.0000 mL | Freq: Once | INTRAVENOUS | Status: AC
  Administered 2011-10-26: 1000 mL via INTRAVENOUS

## 2011-10-26 MED ORDER — HYDROMORPHONE HCL PF 1 MG/ML IJ SOLN
1.0000 mg | Freq: Once | INTRAMUSCULAR | Status: AC
  Administered 2011-10-26: 1 mg via INTRAVENOUS
  Filled 2011-10-26: qty 1

## 2011-10-26 MED ORDER — ONDANSETRON HCL 4 MG/2ML IJ SOLN
4.0000 mg | Freq: Once | INTRAMUSCULAR | Status: AC
  Administered 2011-10-26: 4 mg via INTRAVENOUS
  Filled 2011-10-26: qty 2

## 2011-10-26 NOTE — Discharge Instructions (Signed)
Abdominal Pain Abdominal pain can be caused by many things. Your caregiver decides the seriousness of your pain by an examination and possibly blood tests and X-rays. Many cases can be observed and treated at home. Most abdominal pain is not caused by a disease and will probably improve without treatment. However, in many cases, more time must pass before a clear cause of the pain can be found. Before that point, it may not be known if you need more testing, or if hospitalization or surgery is needed. HOME CARE INSTRUCTIONS   Do not take laxatives unless directed by your caregiver.   Take pain medicine only as directed by your caregiver.   Only take over-the-counter or prescription medicines for pain, discomfort, or fever as directed by your caregiver.   Try a clear liquid diet (broth, tea, or water) for as long as directed by your caregiver. Slowly move to a bland diet as tolerated.  SEEK IMMEDIATE MEDICAL CARE IF:   The pain does not go away.   You have a fever.   You keep throwing up (vomiting).   The pain is felt only in portions of the abdomen. Pain in the right side could possibly be appendicitis. In an adult, pain in the left lower portion of the abdomen could be colitis or diverticulitis.   You pass bloody or black tarry stools.  MAKE SURE YOU:   Understand these instructions.   Will watch your condition.   Will get help right away if you are not doing well or get worse.  Document Released: 04/23/2005 Document Revised: 07/03/2011 Document Reviewed: 03/01/2008 [facility] Patient Information 2012 [place], [place].  Nausea and Vomiting Nausea is a sick feeling that often comes before throwing up (vomiting). Vomiting is a reflex where stomach contents come out of your mouth. Vomiting can cause severe loss of body fluids (dehydration). Children and elderly adults can become dehydrated quickly, especially if they also have diarrhea. Nausea and vomiting are symptoms of a condition or  disease. It is important to find the cause of your symptoms. CAUSES   Direct irritation of the stomach lining. This irritation can result from increased acid production (gastroesophageal reflux disease), infection, food poisoning, taking certain medicines (such as nonsteroidal anti-inflammatory drugs), alcohol use, or tobacco use.   Signals from the brain.These signals could be caused by a headache, heat exposure, an inner ear disturbance, increased pressure in the brain from injury, infection, a tumor, or a concussion, pain, emotional stimulus, or metabolic problems.   An obstruction in the gastrointestinal tract (bowel obstruction).   Illnesses such as diabetes, hepatitis, gallbladder problems, appendicitis, kidney problems, cancer, sepsis, atypical symptoms of a heart attack, or eating disorders.   Medical treatments such as chemotherapy and radiation.   Receiving medicine that makes you sleep (general anesthetic) during surgery.  DIAGNOSIS Your caregiver may ask for tests to be done if the problems do not improve after a few days. Tests may also be done if symptoms are severe or if the reason for the nausea and vomiting is not clear. Tests may include:  Urine tests.   Blood tests.   Stool tests.   Cultures (to look for evidence of infection).   X-rays or other imaging studies.  Test results can help your caregiver make decisions about treatment or the need for additional tests. TREATMENT You need to stay well hydrated. Drink frequently but in small amounts.You may wish to drink water, sports drinks, clear broth, or eat frozen ice pops or gelatin dessert to  help stay hydrated.When you eat, eating slowly may help prevent nausea.There are also some antinausea medicines that may help prevent nausea. HOME CARE INSTRUCTIONS   Take all medicine as directed by your caregiver.   If you do not have an appetite, do not force yourself to eat. However, you must continue to drink fluids.     If you have an appetite, eat a normal diet unless your caregiver tells you differently.   Eat a variety of complex carbohydrates (rice, wheat, potatoes, bread), lean meats, yogurt, fruits, and vegetables.   Avoid high-fat foods because they are more difficult to digest.   Drink enough water and fluids to keep your urine clear or pale yellow.   If you are dehydrated, ask your caregiver for specific rehydration instructions. Signs of dehydration may include:   Severe thirst.   Dry lips and mouth.   Dizziness.   Dark urine.   Decreasing urine frequency and amount.   Confusion.   Rapid breathing or pulse.  SEEK IMMEDIATE MEDICAL CARE IF:   You have blood or brown flecks (like coffee grounds) in your vomit.   You have black or bloody stools.   You have a severe headache or stiff neck.   You are confused.   You have severe abdominal pain.   You have chest pain or trouble breathing.   You do not urinate at least once every 8 hours.   You develop cold or clammy skin.   You continue to vomit for longer than 24 to 48 hours.   You have a fever.  MAKE SURE YOU:   Understand these instructions.   Will watch your condition.   Will get help right away if you are not doing well or get worse.  Document Released: 07/14/2005 Document Revised: 07/03/2011 Document Reviewed: 12/11/2010 [facility] Patient Information 2012 [place], [place].  Metoclopramide tablets What is this medicine? METOCLOPRAMIDE (met oh kloe PRA mide) is used to treat the symptoms of gastroesophageal reflux disease (GERD) like heartburn. It is also used to treat people with slow emptying of the stomach and intestinal tract. This medicine may be used for other purposes; ask your health care provider or pharmacist if you have questions. What should I tell my health care provider before I take this medicine? They need to know if you have any of these conditions: -breast cancer -depression -diabetes -heart  failure -high blood pressure -kidney disease -liver disease -Parkinson's disease or a movement disorder -pheochromocytoma -seizures -stomach obstruction, bleeding, or perforation -an unusual or allergic reaction to metoclopramide, procainamide, sulfites, other medicines, foods, dyes, or preservatives -pregnant or trying to get pregnant -breast-feeding How should I use this medicine? Take this medicine by mouth with a glass of water. Follow the directions on the prescription label. Take this medicine on an empty stomach, about 30 minutes before eating. Take your doses at regular intervals. Do not take your medicine more often than directed. Do not stop taking except on the advice of your doctor or health care professional. A special MedGuide will be given to you by the pharmacist with each prescription and refill. Be sure to read this information carefully each time. Talk to your pediatrician regarding the use of this medicine in children. Special care may be needed. Overdosage: If you think you have taken too much of this medicine contact a poison control center or emergency room at once. NOTE: This medicine is only for you. Do not share this medicine with others. What if I miss a dose? If  you miss a dose, take it as soon as you can. If it is almost time for your next dose, take only that dose. Do not take double or extra doses. What may interact with this medicine? -acetaminophen -cyclosporine -digoxin -medicines for blood pressure -medicines for diabetes, including insulin -medicines for hay fever and other allergies -medicines for depression, especially an Monoamine Oxidase Inhibitor (MAOI) -medicines for Parkinson's disease, like levodopa -medicines for sleep or for pain -tetracycline This list may not describe all possible interactions. Give your health care provider a list of all the medicines, herbs, non-prescription drugs, or dietary supplements you use. Also tell them if you  smoke, drink alcohol, or use illegal drugs. Some items may interact with your medicine. What should I watch for while using this medicine? It may take a few weeks for your stomach condition to start to get better. However, do not take this medicine for longer than 12 weeks. The longer you take this medicine, and the more you take it, the greater your chances are of developing serious side effects. If you are an elderly patient, a female patient, or you have diabetes, you may be at an increased risk for side effects from this medicine. Contact your doctor immediately if you start having movements you cannot control such as lip smacking, rapid movements of the tongue, involuntary or uncontrollable movements of the eyes, head, arms and legs, or muscle twitches and spasms. Patients and their families should watch out for worsening depression or thoughts of suicide. Also watch out for any sudden or severe changes in feelings such as feeling anxious, agitated, panicky, irritable, hostile, aggressive, impulsive, severely restless, overly excited and hyperactive, or not being able to sleep. If this happens, especially at the beginning of treatment or after a change in dose, call your doctor. Do not treat yourself for high fever. Ask your doctor or health care professional for advice. You may get drowsy or dizzy. Do not drive, use machinery, or do anything that needs mental alertness until you know how this drug affects you. Do not stand or sit up quickly, especially if you are an older patient. This reduces the risk of dizzy or fainting spells. Alcohol can make you more drowsy and dizzy. Avoid alcoholic drinks. What side effects may I notice from receiving this medicine? Side effects that you should report to your doctor or health care professional as soon as possible: -allergic reactions like skin rash, itching or hives, swelling of the face, lips, or tongue -abnormal production of milk in females -breast  enlargement in both males and females -change in the way you walk -difficulty moving, speaking or swallowing -drooling, lip smacking, or rapid movements of the tongue -excessive sweating -fever -involuntary or uncontrollable movements of the eyes, head, arms and legs -irregular heartbeat or palpitations -muscle twitches and spasms -unusually weak or tired Side effects that usually do not require medical attention (report to your doctor or health care professional if they continue or are bothersome): -change in sex drive or performance -depressed mood -diarrhea -difficulty sleeping -headache -menstrual changes -restless or nervous This list may not describe all possible side effects. Call your doctor for medical advice about side effects. You may report side effects to FDA at 1-800-FDA-1088. Where should I keep my medicine? Keep out of the reach of children. Store at room temperature between 20 and 25 degrees C (68 and 77 degrees F). Protect from light. Keep container tightly closed. Throw away any unused medicine after the expiration date. NOTE:  This sheet is a summary. It may not cover all possible information. If you have questions about this medicine, talk to your doctor, pharmacist, or health care provider.  2012, [employer]. (03/08/2008 4:30:05 PM)  Acetaminophen; Hydrocodone tablets or capsules What is this medicine? ACETAMINOPHEN; HYDROCODONE (a set a MEE noe fen; hye droe KOE done) is a pain reliever. It is used to treat mild to moderate pain. This medicine may be used for other purposes; ask your health care provider or pharmacist if you have questions. What should I tell my health care provider before I take this medicine? They need to know if you have any of these conditions: -brain tumor -Crohn's disease, inflammatory bowel disease, or ulcerative colitis -drink more than 3 alcohol-containing drinks per day -drug abuse or addiction -head injury -heart or  circulation problems -kidney disease or problems going to the bathroom -liver disease -lung disease, asthma, or breathing problems -an unusual or allergic reaction to acetaminophen, hydrocodone, other opioid analgesics, other medicines, foods, dyes, or preservatives -pregnant or trying to get pregnant -breast-feeding How should I use this medicine? Take this medicine by mouth. Swallow it with a full glass of water. Follow the directions on the prescription label. If the medicine upsets your stomach, take the medicine with food or milk. Do not take more than you are told to take. Talk to your pediatrician regarding the use of this medicine in children. This medicine is not approved for use in children. Overdosage: If you think you have taken too much of this medicine contact a poison control center or emergency room at once. NOTE: This medicine is only for you. Do not share this medicine with others. What if I miss a dose? If you miss a dose, take it as soon as you can. If it is almost time for your next dose, take only that dose. Do not take double or extra doses. What may interact with this medicine? -alcohol or medicines that contain alcohol -antihistamines -isoniazid -medicines for depression, anxiety, or psychotic disturbances -medicines for pain including pentazocine, buprenorphine, butorphanol, nalbuphine, tramadol, and propoxyphene -medicines for sleep -muscle relaxants -naltrexone -phenobarbital -ritonavir This list may not describe all possible interactions. Give your health care provider a list of all the medicines, herbs, non-prescription drugs, or dietary supplements you use. Also tell them if you smoke, drink alcohol, or use illegal drugs. Some items may interact with your medicine. What should I watch for while using this medicine? Tell your doctor or health care professional if your pain does not go away, if it gets worse, or if you have new or a different type of pain. You  may develop tolerance to the medicine. Tolerance means that you will need a higher dose of the medicine for pain relief. Tolerance is normal and is expected if you take the medicine for a long time. Do not suddenly stop taking your medicine because you may develop a severe reaction. Your body becomes used to the medicine. This does NOT mean you are addicted. Addiction is a behavior related to getting and using a drug for a non-medical reason. If you have pain, you have a medical reason to take pain medicine. Your doctor will tell you how much medicine to take. If your doctor wants you to stop the medicine, the dose will be slowly lowered over time to avoid any side effects. You may get drowsy or dizzy when you first start taking the medicine or change doses. Do not drive, use machinery, or do anything  that may be dangerous until you know how the medicine affects you. Stand or sit up slowly. The medicine will cause constipation. Try to have a bowel movement at least every 2 to 3 days. If you do not have a bowel movement for 3 days, call your doctor or health care professional. Too much acetaminophen can be very dangerous. Do not take Tylenol (acetaminophen) or medicines that contain acetaminophen with this medicine. Many non-prescription medicines contain acetaminophen. Always read the labels carefully. What side effects may I notice from receiving this medicine? Side effects that you should report to your doctor or health care professional as soon as possible: -allergic reactions like skin rash, itching or hives, swelling of the face, lips, or tongue -breathing problems -confusion -feeling faint or lightheaded, falls -stomach pain -yellowing of the eyes or skin Side effects that usually do not require medical attention (report to your doctor or health care professional if they continue or are bothersome): -nausea, vomiting -stomach upset This list may not describe all possible side effects. Call your  doctor for medical advice about side effects. You may report side effects to FDA at 1-800-FDA-1088. Where should I keep my medicine? Keep out of the reach of children. This medicine can be abused. Keep your medicine in a safe place to protect it from theft. Do not share this medicine with anyone. Selling or giving away this medicine is dangerous and against the law. Store at room temperature between 15 and 30 degrees C (59 and 86 degrees F). Protect from light. Keep container tightly closed. Throw away any unused medicine after the expiration date. NOTE: This sheet is a summary. It may not cover all possible information. If you have questions about this medicine, talk to your doctor, pharmacist, or health care provider.  2012, [employer]. (10/05/2007 10:25:07 AM)

## 2011-10-26 NOTE — ED Provider Notes (Signed)
History     CSN: [id number]  Arrival date & time 10/25/11  2353   First MD Initiated Contact with Patient 10/26/11 [phone]      Chief Complaint  Patient presents with  . Back Pain    (Consider location/radiation/quality/duration/timing/severity/associated sxs/prior treatment) Patient is a 46 y.o. female presenting with back pain. The history is provided by the patient.  Back Pain   She has chronic back pain which she blames on fibromyalgia, but this morning had onset of severe pain in the lower lumbar area bilaterally. Pain radiated through to the upper abdomen. It is sharp and she rates it at 10 out of 10. Nothing makes it better nothing makes it worse. There is associated nausea and vomiting. She denies fever, chills, sweats and denies dysuria. Denies any numbness or tingling. She had taken a dose of Zofran prior to vomiting. She's tried taking hot bath without any benefit. She rates pain at 10 out of 10.  Past Medical History  Diagnosis Date  . Bipolar 1 disorder   . Diabetes mellitus   . Hypertension   . Fibromyalgia     Past Surgical History  Procedure Date  . Mandible surgery   . Cholecystectomy     No family history on file.  History  Substance Use Topics  . Smoking status: Current Everyday Smoker -- 1.0 packs/day for 30 years    Types: Cigarettes  . Smokeless tobacco: Not on file  . Alcohol Use: No    OB History    Grav Para Term Preterm Abortions TAB SAB Ect Mult Living   2               Review of Systems  Musculoskeletal: Positive for back pain.  All other systems reviewed and are negative.    Allergies  Abilify; Cogentin; Haldol; and Procaine hcl  Home Medications   Current Outpatient Rx  Name Route Sig Dispense Refill  . ALPRAZOLAM 1 MG PO TABS Oral Take 1 mg by mouth 4 (four) times daily.    Marland Kitchen HYDROCODONE-ACETAMINOPHEN 5-325 MG PO TABS Oral Take 1 tablet by mouth every 4 (four) hours as needed for pain. 20 tablet 0  . LISINOPRIL 10 MG PO TABS  Oral Take 10 mg by mouth daily.     Marland Kitchen METFORMIN HCL 500 MG PO TABS Oral Take 1 tablet (500 mg total) by mouth 2 (two) times daily with a meal. 60 tablet 0  . ONDANSETRON HCL 4 MG PO TABS  1 po q6h prn nausea 12 tablet 0  . VICKS VAPOR INHALER IN Inhalation Inhale 1 spray into the lungs daily as needed.      Marland Kitchen BENZTROPINE MESYLATE 1 MG PO TABS Oral Take 1 mg by mouth 1 day or 1 dose.      Marland Kitchen HALOPERIDOL 5 MG PO TABS Oral Take 5 mg by mouth 1 day or 1 dose.      . INSULIN GLARGINE 100 UNIT/ML [place] SOLN Subcutaneous Inject 25 Units into the skin at bedtime.        BP 107/59  Temp(Src) 97.9 F (36.6 C) (Oral)  Resp 20  Ht 5\' 1"  (1.549 m)  Wt 232 lb (105.235 kg)  BMI 43.84 kg/m2  SpO2 97%  LMP 05/24/2011  Physical Exam  Nursing note and vitals reviewed.  47 year old female who appears to be in pain. She is moderately obese. Vital signs are normal. Oxygen saturation is 97% which is normal. Head is normocephalic and atraumatic. PERRLA, EOMI. There is  no scleral icterus. Oropharynx is clear. Neck is nontender and supple without adenopathy. Back has some mild tenderness across the lower lumbar area without point tenderness. Lungs are clear without any rales, wheezes, or rhonchi. Heart has regular rate and rhythm without murmur. Abdomen is obese, soft, with moderate tenderness in the epigastric area without any rebound or guarding. Peristalsis is diminished. There's no hepatosplenomegaly. Extremities have no cyanosis or edema, full range of motion is present. There is negative straight leg raise. Skin is warm and dry without rash. Neurologic: Mental status is normal, cranial nerves are intact, there no focal motor or sensory deficits.  ED Course  Procedures (including critical care time)  Results for orders placed during the hospital encounter of 10/25/11  CBC      Component Value Range   WBC 6.1  4.0 - 10.5 (K/uL)   RBC 4.16  3.87 - 5.11 (MIL/uL)   Hemoglobin 13.9  12.0 - 15.0 (g/dL)   HCT 21.3   08.6 - 57.8 (%)   MCV 95.9  78.0 - 100.0 (fL)   MCH 33.4  26.0 - 34.0 (pg)   MCHC 34.8  30.0 - 36.0 (g/dL)   RDW 46.9  62.9 - 52.8 (%)   Platelets 137 (*) 150 - 400 (K/uL)  DIFFERENTIAL      Component Value Range   Neutrophils Relative 45  43 - 77 (%)   Neutro Abs 2.7  1.7 - 7.7 (K/uL)   Lymphocytes Relative 48 (*) 12 - 46 (%)   Lymphs Abs 2.9  0.7 - 4.0 (K/uL)   Monocytes Relative 6  3 - 12 (%)   Monocytes Absolute 0.4  0.1 - 1.0 (K/uL)   Eosinophils Relative 1  0 - 5 (%)   Eosinophils Absolute 0.1  0.0 - 0.7 (K/uL)   Basophils Relative 1  0 - 1 (%)   Basophils Absolute 0.0  0.0 - 0.1 (K/uL)  COMPREHENSIVE METABOLIC PANEL      Component Value Range   Sodium 137  135 - 145 (mEq/L)   Potassium 3.5  3.5 - 5.1 (mEq/L)   Chloride 100  96 - 112 (mEq/L)   CO2 28  19 - 32 (mEq/L)   Glucose, Bld 109 (*) 70 - 99 (mg/dL)   BUN 8  6 - 23 (mg/dL)   Creatinine, Ser 4.13  0.50 - 1.10 (mg/dL)   Calcium 9.5  8.4 - 24.4 (mg/dL)   Total Protein 7.6  6.0 - 8.3 (g/dL)   Albumin 3.6  3.5 - 5.2 (g/dL)   AST 22  0 - 37 (U/L)   ALT 14  0 - 35 (U/L)   Alkaline Phosphatase 74  39 - 117 (U/L)   Total Bilirubin 0.5  0.3 - 1.2 (mg/dL)   GFR calc non Af Amer >90  >90 (mL/min)   GFR calc Af Amer >90  >90 (mL/min)  LIPASE, BLOOD      Component Value Range   Lipase 14  11 - 59 (U/L)  URINALYSIS, ROUTINE W REFLEX MICROSCOPIC      Component Value Range   Color, Urine YELLOW  YELLOW    APPearance CLEAR  CLEAR    Specific Gravity, Urine 1.010  1.005 - 1.030    pH 7.0  5.0 - 8.0    Glucose, UA NEGATIVE  NEGATIVE (mg/dL)   Hgb urine dipstick NEGATIVE  NEGATIVE    Bilirubin Urine NEGATIVE  NEGATIVE    Ketones, ur NEGATIVE  NEGATIVE (mg/dL)   Protein, ur NEGATIVE  NEGATIVE (mg/dL)   Urobilinogen, UA 0.2  0.0 - 1.0 (mg/dL)   Nitrite NEGATIVE  NEGATIVE    Leukocytes, UA NEGATIVE  NEGATIVE    0350: Laboratory workup is unremarkable. She got good relief of pain with hydromorphone but is still complaining of  nausea. She'll be given a dose of metoclopramide and I anticipate sending her home with a prescription for Norco and metoclopramide.  0515: She had good relief of nausea with metoclopramide.  1. Abdominal pain   2. Nausea and vomiting       MDM  Although her primary complaint is back pain, I suspect that that is more abdominal pain with radiation through to the back. With epigastric pain radiating through I am concerned about possibility of pancreatitis and lipase will be checked. Her old records have been reviewed and she's had 2 EEG visits in the last week-1 for abdominal pain and one for back pain. Review old records, she has several behavioral health Hospital admissions for bipolar disorder and she's had numerous CT scans of her abdomen and pelvis, although none in the last year.        Dione Booze, MD 10/26/11 [id number]

## 2011-11-01 ENCOUNTER — Encounter (HOSPITAL_COMMUNITY): Payer: Self-pay | Admitting: Emergency Medicine

## 2011-11-01 ENCOUNTER — Emergency Department (HOSPITAL_COMMUNITY)
Admission: EM | Admit: 2011-11-01 | Discharge: 2011-11-01 | Disposition: A | Discharge status: Other Acute Inpatient Hospital | Payer: 59 | Source: Home / Self Care | Attending: Emergency Medicine | Admitting: Emergency Medicine

## 2011-11-01 DIAGNOSIS — F29 Unspecified psychosis not due to a substance or known physiological condition: Secondary | ICD-10-CM | POA: Insufficient documentation

## 2011-11-01 DIAGNOSIS — F319 Bipolar disorder, unspecified: Secondary | ICD-10-CM | POA: Insufficient documentation

## 2011-11-01 DIAGNOSIS — Z794 Long term (current) use of insulin: Secondary | ICD-10-CM | POA: Insufficient documentation

## 2011-11-01 DIAGNOSIS — R112 Nausea with vomiting, unspecified: Secondary | ICD-10-CM | POA: Insufficient documentation

## 2011-11-01 DIAGNOSIS — M549 Dorsalgia, unspecified: Secondary | ICD-10-CM | POA: Insufficient documentation

## 2011-11-01 DIAGNOSIS — E119 Type 2 diabetes mellitus without complications: Secondary | ICD-10-CM | POA: Insufficient documentation

## 2011-11-01 DIAGNOSIS — M25559 Pain in unspecified hip: Secondary | ICD-10-CM | POA: Insufficient documentation

## 2011-11-01 DIAGNOSIS — I10 Essential (primary) hypertension: Secondary | ICD-10-CM | POA: Insufficient documentation

## 2011-11-01 DIAGNOSIS — F17200 Nicotine dependence, unspecified, uncomplicated: Secondary | ICD-10-CM | POA: Insufficient documentation

## 2011-11-01 DIAGNOSIS — IMO0001 Reserved for inherently not codable concepts without codable children: Secondary | ICD-10-CM | POA: Insufficient documentation

## 2011-11-01 DIAGNOSIS — R509 Fever, unspecified: Secondary | ICD-10-CM | POA: Insufficient documentation

## 2011-11-01 LAB — DIFFERENTIAL
Basophils Absolute: 0 10*3/uL (ref 0.0–0.1)
Eosinophils Absolute: 0 10*3/uL (ref 0.0–0.7)
Eosinophils Relative: 1 % (ref 0–5)
Lymphocytes Relative: 34 % (ref 12–46)
Neutrophils Relative %: 59 % (ref 43–77)

## 2011-11-01 LAB — COMPREHENSIVE METABOLIC PANEL
ALT: 16 U/L (ref 0–35)
AST: 21 U/L (ref 0–37)
Alkaline Phosphatase: 78 U/L (ref 39–117)
Calcium: 9.5 mg/dL (ref 8.4–10.5)
Potassium: 3.9 mEq/L (ref 3.5–5.1)
Sodium: 139 mEq/L (ref 135–145)
Total Protein: 7.2 g/dL (ref 6.0–8.3)

## 2011-11-01 LAB — CBC
MCH: 33.1 pg (ref 26.0–34.0)
MCV: 95.7 fL (ref 78.0–100.0)
Platelets: 139 10*3/uL — ABNORMAL LOW (ref 150–400)
RBC: 3.96 MIL/uL (ref 3.87–5.11)
RDW: 12.8 % (ref 11.5–15.5)
WBC: 4.7 10*3/uL (ref 4.0–10.5)

## 2011-11-01 LAB — URINALYSIS, ROUTINE W REFLEX MICROSCOPIC
Bilirubin Urine: NEGATIVE
Leukocytes, UA: NEGATIVE
Nitrite: NEGATIVE
Specific Gravity, Urine: 1.01 (ref 1.005–1.030)
Urobilinogen, UA: 0.2 mg/dL (ref 0.0–1.0)
pH: 8 (ref 5.0–8.0)

## 2011-11-01 LAB — RAPID URINE DRUG SCREEN, HOSP PERFORMED
Barbiturates: NOT DETECTED
Benzodiazepines: POSITIVE — AB

## 2011-11-01 LAB — HCG, QUANTITATIVE, PREGNANCY: hCG, Beta Chain, Quant, S: 1 m[IU]/mL (ref <5)

## 2011-11-01 LAB — ETHANOL: Alcohol, Ethyl (B): <11 mg/dL (ref 0–11)

## 2011-11-01 MED ORDER — METFORMIN HCL 500 MG PO TABS
500.0000 mg | ORAL_TABLET | Freq: Two times a day (BID) | ORAL | Status: DC
  Administered 2011-11-01: 500 mg via ORAL
  Filled 2011-11-01: qty 1

## 2011-11-01 MED ORDER — INSULIN GLARGINE 100 UNIT/ML ~~LOC~~ SOLN: 25.0000 [IU] | Freq: Every day | SUBCUTANEOUS | Status: DC

## 2011-11-01 MED ORDER — LORAZEPAM 1 MG PO TABS
1.0000 mg | ORAL_TABLET | Freq: Three times a day (TID) | ORAL | Status: DC | PRN
  Administered 2011-11-01: 1 mg via ORAL
  Filled 2011-11-01: qty 1

## 2011-11-01 MED ORDER — LORAZEPAM 2 MG/ML IJ SOLN
2.0000 mg | Freq: Once | INTRAMUSCULAR | Status: AC
  Administered 2011-11-01: 2 mg via INTRAVENOUS
  Filled 2011-11-01: qty 1

## 2011-11-01 MED ORDER — IBUPROFEN 800 MG PO TABS
800.0000 mg | ORAL_TABLET | Freq: Once | ORAL | Status: AC
  Administered 2011-11-01: 800 mg via ORAL
  Filled 2011-11-01: qty 1

## 2011-11-01 MED ORDER — LISINOPRIL 10 MG PO TABS
10.0000 mg | ORAL_TABLET | Freq: Every day | ORAL | Status: DC
  Filled 2011-11-01 (×2): qty 1

## 2011-11-01 MED ORDER — INSULIN ASPART 100 UNIT/ML ~~LOC~~ SOLN: 0.0000 [IU] | Freq: Three times a day (TID) | SUBCUTANEOUS | Status: DC

## 2011-11-01 MED ORDER — ACETAMINOPHEN 325 MG PO TABS: 650.0000 mg | ORAL_TABLET | ORAL | Status: DC | PRN

## 2011-11-01 MED ORDER — METOCLOPRAMIDE HCL 10 MG PO TABS: 10.0000 mg | ORAL_TABLET | Freq: Four times a day (QID) | ORAL | Status: DC | PRN

## 2011-11-01 MED ORDER — ONDANSETRON HCL 4 MG PO TABS
4.0000 mg | ORAL_TABLET | Freq: Three times a day (TID) | ORAL | Status: DC | PRN
  Administered 2011-11-01: 4 mg via ORAL
  Filled 2011-11-01: qty 1

## 2011-11-01 NOTE — ED Provider Notes (Signed)
History    Scribed for [name], MD, the patient was seen in room APA03/APA03. This chart was scribed by Katha Cabal.   CSN: [id number]  Arrival date & time 11/01/11  1353   First MD Initiated Contact with Patient 11/01/11 1404      Chief Complaint  Patient presents with  . Back Pain  . Hip Pain    (Consider location/radiation/quality/duration/timing/severity/associated sxs/prior treatment) HPI [name] is a 45 y.o. female who presents to the Emergency Department complaining of stabbing chronic myalgias.  Patient was seen for same pain on 10/26/11 in ED.  Back pain radiates down left leg.  Patient has history of fibromyalgia. Nothing makes pain better or worse.  Nothing taken for pain.  Symptoms are associated with vomiting and nausea fever.  Patient took antiemetic for nausea without relief.   Symptoms are not associated with bowel or bladder incontinence.  Patient states she is 6 months pregnant and is having abdominal cramping.  Patient's last menstrual period was 05/24/2011.  Patient does not take medication for depression or anxiety.  Denies SI and HI.  Patient has history of hypertension and diabetes mellitus.   Former PCP Dr. Delbert Harness.       Past Medical History  Diagnosis Date  . Bipolar 1 disorder   . Diabetes mellitus   . Hypertension   . Fibromyalgia     Past Surgical History  Procedure Date  . Mandible surgery   . Cholecystectomy     No family history on file.  History  Substance Use Topics  . Smoking status: Current Everyday Smoker -- 1.0 packs/day for 30 years    Types: Cigarettes  . Smokeless tobacco: Not on file  . Alcohol Use: No    OB History    Grav Para Term Preterm Abortions TAB SAB Ect Mult Living   2               Review of Systems  All other systems reviewed and are negative.    Allergies  Abilify; Cogentin; Haldol; and Procaine hcl  Home Medications   Current Outpatient Rx  Name Route Sig Dispense Refill  .  ALPRAZOLAM 1 MG PO TABS Oral Take 1 mg by mouth 4 (four) times daily.    Marland Kitchen HYDROCODONE-ACETAMINOPHEN 5-325 MG PO TABS Oral Take 1 tablet by mouth every 4 (four) hours as needed for pain. 15 tablet 0  . INSULIN GLARGINE 100 UNIT/ML [place] SOLN Subcutaneous Inject 25 Units into the skin at bedtime.      Marland Kitchen LISINOPRIL 10 MG PO TABS Oral Take 10 mg by mouth daily.     Marland Kitchen METFORMIN HCL 500 MG PO TABS Oral Take 1 tablet (500 mg total) by mouth 2 (two) times daily with a meal. 60 tablet 0  . METOCLOPRAMIDE HCL 10 MG PO TABS Oral Take 1 tablet (10 mg total) by mouth every 6 (six) hours as needed (nausea). 30 tablet 0    BP 105/60  Pulse 80  Temp(Src) 97.4 F (36.3 C) (Oral)  Resp 18  Ht 5\' 1"  (1.549 m)  Wt 232 lb (105.235 kg)  BMI 43.84 kg/m2  SpO2 100%  LMP 05/24/2011  Physical Exam  Nursing note and vitals reviewed. Constitutional: She is oriented to person, place, and time. She appears well-developed.  HENT:  Head: Normocephalic and atraumatic.  Eyes: Conjunctivae and EOM are normal.  Neck: Neck supple.  Cardiovascular: Normal rate, regular rhythm and normal heart sounds.   No murmur heard.  DP and PT pulses intact   Pulmonary/Chest: Effort normal and breath sounds normal. No respiratory distress.  Abdominal: There is no tenderness. There is no rebound and no guarding.  Musculoskeletal: Normal range of motion. She exhibits no edema.       Diffuse paraspinal tenderness,  plantar and dorsiflexion intact   Neurological: She is alert and oriented to person, place, and time. She has normal strength. No sensory deficit. Coordination normal.  Reflex Scores:      Patellar reflexes are 2+ on the right side and 2+ on the left side.      5/5 strength bilaterally Great toe dorsiflexion intact bilaterally  Skin: Skin is warm and dry. No rash noted.  Psychiatric: Her behavior is normal. She expresses no homicidal and no suicidal ideation.    ED Course  Procedures (including critical care  time)   DIAGNOSTIC STUDIES: Oxygen Saturation is 98% on room air, normal by my interpretation.     COORDINATION OF CARE: 3:09 PM  Physical exam complete.  Will review UA.   3:14 PM  Ibuprofen ordered.  Consult with behavioral health.   8:35 PM  Patient excepted to [facility] by Dr. Allena Katz.     LABS / RADIOLOGY:   Labs Reviewed  CBC - Abnormal; Notable for the following:    Platelets 139 (*)    All other components within normal limits  COMPREHENSIVE METABOLIC PANEL - Abnormal; Notable for the following:    Glucose, Bld 106 (*)    BUN 4 (*)    Albumin 3.4 (*)    All other components within normal limits  URINALYSIS, ROUTINE W REFLEX MICROSCOPIC  POCT PREGNANCY, URINE  DIFFERENTIAL  ETHANOL  HCG, QUANTITATIVE, PREGNANCY  URINE RAPID DRUG SCREEN (HOSP PERFORMED)   No results found.       MDM  Acute and chronic myalgias and arthralgias. No neurovascular deficits. No bowel or bladder incontinence. No joint effusions or overlying skin changes. No concern for septic joint.  She has rapid speech flight of ideas and delusion that she is pregnant.  Will need psychiatric evaluation. She is rambling speech talking about her family who is all [place] [employer].  Screening labs, pain control, d/w ACT team.  Accepted at behavioral health hospital by Dr. Allena Katz.   MEDICATIONS GIVEN IN THE E.D. Scheduled Meds:    . ibuprofen  800 mg Oral Once   Continuous Infusions:      IMPRESSION: No diagnosis found.   NEW MEDICATIONS: New Prescriptions   No medications on file     I personally performed the services described in this documentation, which was scribed in my presence.  The recorded information has been reviewed and considered.         [name], MD 11/01/11 [phone]

## 2011-11-01 NOTE — ED Notes (Signed)
Patient in room yelling and screaming at staff.[place] pd at side and security richard at side

## 2011-11-01 NOTE — ED Notes (Signed)
Pt here with cc of back pain, rib pain and hip pain.  Pt seen on 3/31 for same.  Pt states she hhas fibromyalgia and has no meds for pain.
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
## 2011-11-01 NOTE — ED Notes (Signed)
Pt is 6 months pregnant

## 2011-11-02 ENCOUNTER — Inpatient Hospital Stay (HOSPITAL_COMMUNITY)
Admission: AD | Admit: 2011-11-02 | Discharge: 2011-11-03 | DRG: 885 | Disposition: A | Discharge status: Home / Self Care | Payer: 59 | Attending: Psychiatry | Admitting: Psychiatry

## 2011-11-02 DIAGNOSIS — F1210 Cannabis abuse, uncomplicated: Secondary | ICD-10-CM | POA: Diagnosis present

## 2011-11-02 DIAGNOSIS — E119 Type 2 diabetes mellitus without complications: Secondary | ICD-10-CM | POA: Diagnosis present

## 2011-11-02 DIAGNOSIS — F259 Schizoaffective disorder, unspecified: Principal | ICD-10-CM | POA: Diagnosis present

## 2011-11-02 DIAGNOSIS — I10 Essential (primary) hypertension: Secondary | ICD-10-CM | POA: Diagnosis present

## 2011-11-02 DIAGNOSIS — F1994 Other psychoactive substance use, unspecified with psychoactive substance-induced mood disorder: Secondary | ICD-10-CM

## 2011-11-02 DIAGNOSIS — Z794 Long term (current) use of insulin: Secondary | ICD-10-CM

## 2011-11-02 DIAGNOSIS — IMO0001 Reserved for inherently not codable concepts without codable children: Secondary | ICD-10-CM | POA: Diagnosis present

## 2011-11-02 DIAGNOSIS — F319 Bipolar disorder, unspecified: Secondary | ICD-10-CM | POA: Diagnosis present

## 2011-11-02 DIAGNOSIS — F250 Schizoaffective disorder, bipolar type: Secondary | ICD-10-CM | POA: Diagnosis present

## 2011-11-02 DIAGNOSIS — Z9119 Patient's noncompliance with other medical treatment and regimen: Secondary | ICD-10-CM

## 2011-11-02 DIAGNOSIS — F17200 Nicotine dependence, unspecified, uncomplicated: Secondary | ICD-10-CM | POA: Diagnosis present

## 2011-11-02 DIAGNOSIS — Z79899 Other long term (current) drug therapy: Secondary | ICD-10-CM

## 2011-11-02 DIAGNOSIS — Z91199 Patient's noncompliance with other medical treatment and regimen due to unspecified reason: Secondary | ICD-10-CM

## 2011-11-02 LAB — GLUCOSE, CAPILLARY

## 2011-11-02 MED ORDER — ZIPRASIDONE MESYLATE 20 MG IM SOLR
INTRAMUSCULAR | Status: AC
  Administered 2011-11-02: 20 mg via INTRAMUSCULAR
  Filled 2011-11-02: qty 20

## 2011-11-02 MED ORDER — METFORMIN HCL 500 MG PO TABS
500.0000 mg | ORAL_TABLET | Freq: Two times a day (BID) | ORAL | Status: DC
  Administered 2011-11-02 – 2011-11-03 (×3): 500 mg via ORAL
  Filled 2011-11-02 (×6): qty 1

## 2011-11-02 MED ORDER — METOCLOPRAMIDE HCL 10 MG PO TABS: 10.0000 mg | ORAL_TABLET | Freq: Four times a day (QID) | ORAL | Status: DC | PRN

## 2011-11-02 MED ORDER — ACETAMINOPHEN 325 MG PO TABS: 650.0000 mg | ORAL_TABLET | Freq: Four times a day (QID) | ORAL | Status: DC | PRN

## 2011-11-02 MED ORDER — LISINOPRIL 10 MG PO TABS
10.0000 mg | ORAL_TABLET | Freq: Every day | ORAL | Status: DC
  Administered 2011-11-02 – 2011-11-03 (×2): 10 mg via ORAL
  Filled 2011-11-02 (×4): qty 1

## 2011-11-02 MED ORDER — ZIPRASIDONE HCL 40 MG PO CAPS
40.0000 mg | ORAL_CAPSULE | Freq: Two times a day (BID) | ORAL | Status: DC
  Administered 2011-11-02 – 2011-11-03 (×3): 40 mg via ORAL
  Filled 2011-11-02 (×6): qty 1

## 2011-11-02 MED ORDER — MAGNESIUM HYDROXIDE 400 MG/5ML PO SUSP: 30.0000 mL | Freq: Every day | ORAL | Status: DC | PRN

## 2011-11-02 MED ORDER — ALUM & MAG HYDROXIDE-SIMETH 200-200-20 MG/5ML PO SUSP: 30.0000 mL | ORAL | Status: DC | PRN

## 2011-11-02 MED ORDER — ZIPRASIDONE MESYLATE 20 MG IM SOLR
20.0000 mg | INTRAMUSCULAR | Status: DC | PRN
  Administered 2011-11-02: 20 mg via INTRAMUSCULAR
  Filled 2011-11-02: qty 20

## 2011-11-02 MED ORDER — ALPRAZOLAM 0.5 MG PO TABS
0.5000 mg | ORAL_TABLET | Freq: Four times a day (QID) | ORAL | Status: DC | PRN
  Administered 2011-11-02 (×3): 0.5 mg via ORAL
  Filled 2011-11-02 (×3): qty 1

## 2011-11-02 MED ORDER — HYDROCODONE-ACETAMINOPHEN 5-325 MG PO TABS
1.0000 | ORAL_TABLET | ORAL | Status: DC | PRN
  Administered 2011-11-02 (×3): 1 via ORAL
  Filled 2011-11-02 (×3): qty 1

## 2011-11-02 NOTE — Progress Notes (Addendum)
Pt arrived via [facility] after IVC by [facility] MD. Pt presented to ED with complaints of pain however pt was exhibiting delusional thought patterns. Reported to ED staff she is pregnant with twins (UPT negative) as well as several other nonsensical claims. Became very hostile and threatening with ED staff who medicated pt with IM Ativan. On arrival to [facility], pt continued with much of the same behavior. Pt yelling, cursing, screaming, threatening to staff. Insists she will not go willingly back to unit. Pt insisting she is not "crazy" and states she will "get the girl that sent her here" as well as the MD who signed petition. Pt verbally deescalated, medicated with geodon 20mg  IM. Given [place] sandwich and gatorade. Pt's CBG 92 and BP stable with pulse of 58. Pt searched and escorted to room. Pt made high fall risk due to unsteady gait, weakness in legs and IM meds. Once settled in room pt tearful and apologetic though still very angry and plans to seek discharge from MD in A.M. Denies any SI/HI/AVH however parts of admit unable to be completed. Lawrence Marseilles    Pt has several pitted and discolored areas under her right arm as well as her right shoulder blade. Pt reports these are from spider bites. Note flagged for providers to assess this morning. Lawrence Marseilles

## 2011-11-02 NOTE — Progress Notes (Addendum)
[facility] Group Notes:  (Counselor/Nursing/MHT/Case Management/Adjunct)  11/02/2011 10:30 AM  Type of Therapy:  Group Therapy, Dance/Movement Therapy   Participation Level:  Minimal  Participation Quality:  Drowsy and Supportive  Affect:  Depressed  Cognitive:  Confused  Insight:  Limited  Engagement in Group:  Limited  Engagement in Therapy:  None  Modes of Intervention:  Clarification, Problem-solving, Role-play, Socialization and Support  Summary of Progress/Problems: pt spoke about supports to use at D/C. Pt was discussing how she felt that her admittance was a "mistake" and that she would like to go home.

## 2011-11-02 NOTE — [facility] Suicide Risk Assessment (Signed)
Suicide Risk Assessment  Admission Assessment     Demographic factors:  Assessment Details Time of Assessment: Admission Information Obtained From: Review of record;Patient Current Mental Status:  Current Mental Status: Thoughts of violence towards others Loss Factors:  Loss Factors:  (UTA) Historical Factors:  Historical Factors: Impulsivity Risk Reduction Factors:  Risk Reduction Factors:  (UTA)  [name] was seen and interviewed this morning. She presented to the ED with thought that she might be pregnant with twins. She became agitated when behavioral health counselor was called in to see her after the pregnancy test came back negative. She was reported to be nonsensical at the time and agitated. Her urine drug screen was positive for benzodiazepines, opiates, and cannabis. She currently describes her mood as "great" and reports good sleep, appetite, and energy level. She adamantly denies any current thoughts of harming herself or anyone else. She is reality-based during our conversation today, and forward-thinking. She denies current manic or psychotic symptoms.  I contacted the patient's husband who stated that the patient was thinking clearly and at baseline at home, and took herself to the emergency room for nausea. He has spoken to her over the phone and feels that she is at her mental health baseline. He does say that the patient does not have a regular primary care provider due to providers in his area not accepting Medicaid, and he has requested assistance with this; I communicated this with the case manager.  CLINICAL FACTORS:  Mood disorder, NOS; history of psychiatric hospitalization and treatment; substance dependence; multiple medical problems  COGNITIVE FEATURES THAT CONTRIBUTE TO RISK:  Closed-mindedness    SUICIDE RISK:  Mild: Suicidal ideation of limited frequency, intensity, duration, and specificity.  There are no identifiable plans, no associated intent, mild  dysphoria and related symptoms, good self-control (both objective and subjective assessment), few other risk factors, and identifiable protective factors, including available and accessible social support.  PLAN OF CARE: 1. Based on the history and assessment above, the patient is safe to be maintained at q15 observation at this time. 2. Plan to continue with outpatient medication (Geodon) for mood stabilization.  Eligah East 11/02/2011, 1:47 PM

## 2011-11-02 NOTE — Progress Notes (Signed)
[facility] Adult Inpatient Family/Significant Other Suicide Prevention Education  Suicide Prevention Education:  Education Completed; Andrea  [phone]-(Husband) has been identified by the patient as the family member/significant other with whom the patient will be residing, and identified as the person(s) who will aid the patient in the event of a mental health crisis (suicidal ideations/suicide attempt).  With written consent from the patient, the family member/significant other has been provided the following suicide prevention education, prior to the and/or following the discharge of the patient.  The suicide prevention education provided includes the following:  Suicide risk factors  Suicide prevention and interventions  National Suicide Hotline telephone number  Operating Room Services assessment telephone number  [facility] Emergency Assistance 911  [facility] and/or Residential Mobile Crisis Unit telephone number  Request made of family/significant other to:  Remove weapons (e.g., guns, rifles, knives), all items previously/currently identified as safety concern. Pt.'s husband will secure guns that are in the home before pt is d/c. He states they are hidden.    Remove drugs/medications (over-the-counter, prescriptions, illicit drugs), all items previously/currently identified as a safety concern.Pt.'s had no concerns but states pt. Is no longer seeing Dr. Verlon Au due to him having a heart attack who is with [facility]. Pt.'s husband states the pt. Saw Carroll Sage, located on [address] in [place], [place] and she was the one who prescribed the pt.'s Xanix 4 times a day. Pt.'s husband states he thought his wife was brought to the hospital due to a misunderstanding with the ED doctor. Pt.'s husband states the pt. Has problems with anxiety and that the pt. Is having a hard time finding mental health services in [place]. Where they live. Pt.'s husband asked if the  pt. Could come home and was told that would be up to the doctor. Pt.'s husband feels safe with the pt. being d/c and states she has not had any prior problems with SI attempts or SI thoughts. Pt. Denies SI and HI or hallucinations during the update intake.  The family member/significant other verbalizes understanding of the suicide prevention education information provided.  The family member/significant other agrees to remove the items of safety concern listed above.  Lamar Blinks Gonzales 11/02/2011, 11:04 AM

## 2011-11-02 NOTE — Progress Notes (Addendum)
Pt. attended and participated in aftercare planning group. Pt. accepted information on suicide prevention, warning signs to look for with suicide and crisis line numbers to use. The pt. agreed to call crisis line numbers if having warning signs or having thoughts of suicide. Pt. listed their current energy level as "high" and stated that she is feeling much better today. She stated she receives services at [facility] and would like to go back there upon D/C.

## 2011-11-02 NOTE — H&P (Signed)
Psychiatric Admission Assessment Adult  Patient Identification:  [name] Date of Evaluation:  11/02/2011 45yo WF -says she is married ? History of Present Illness: Presented to ED initially c/o pain had been seen 3/31/for the same. Later on she stated she was 6 months pregnant and was having abdominal cramping.She has presented this way before. UDS was +Benzoes opiates and THC.  Now that she is no longer high can make a plausible accounting of how the pregnancy report came about.     Past Psychiatric History: Long history last her 9/12-9/17/12 Currently followed by Dr B. Harris in [place] at [place].  Substance Abuse History:  Social History:    reports that she has been smoking Cigarettes.  She has a 30 pack-year smoking history. She does not have any smokeless tobacco history on file. She reports that she does not drink alcohol or use illicit drugs. Oh Contrare UDS + THC Opitaes Benzoes   Family Psych History: Denies   Past Medical History:     Past Medical History  Diagnosis Date  . Bipolar 1 disorder   . Diabetes mellitus   . Hypertension   . Fibromyalgia        Past Surgical History  Procedure Date  . Mandible surgery   . Cholecystectomy     Allergies:  Allergies  Allergen Reactions  . Abilify Other (See Comments)    Gives me "Knots in legs"  . Cogentin (Benztropine Mesylate) Other (See Comments)    "makes my head spin"  . Haldol (Haloperidol Decanoate) Nausea And Vomiting    "makes my legs stiff"  . Procaine Hcl (Novocain) Swelling    Eyes swell shut    Current Medications:  Prior to Admission medications   Medication Sig Start Date End Date Taking? Authorizing Provider  ALPRAZolam Prudy Feeler) 1 MG tablet Take 1 mg by mouth 4 (four) times daily.    Historical Provider, MD  HYDROcodone-acetaminophen (NORCO) 5-325 MG per tablet Take 1 tablet by mouth every 4 (four) hours as needed for pain. 10/26/11 11/05/11  Dione Booze, MD  insulin glargine (LANTUS) 100  UNIT/ML injection Inject 25 Units into the skin at bedtime.      Historical Provider, MD  lisinopril (PRINIVIL,ZESTRIL) 10 MG tablet Take 10 mg by mouth daily.     Historical Provider, MD  metFORMIN (GLUCOPHAGE) 500 MG tablet Take 1 tablet (500 mg total) by mouth 2 (two) times daily with a meal. 03/29/11 03/28/12  Ward Givens, MD  metoCLOPramide (REGLAN) 10 MG tablet Take 1 tablet (10 mg total) by mouth every 6 (six) hours as needed (nausea). 10/26/11 11/05/11  Dione Booze, MD    Mental Status Examination/Evaluation: Objective:  Appearance: Disheveled appears much older   Psychomotor Activity:  Normal  Eye Contact::  Good  Speech:  Clear and Coherent  Volume:  Normal  Mood:easily irritated    Affect:  Congruent  Thought Process: clear rational goal oriented -get back on meds    Orientation:  Full  Thought Content:  Delusions when high   Suicidal Thoughts:  No  Homicidal Thoughts:  No  Judgement:  Impaired  Insight:  Shallow    DIAGNOSIS:    AXIS I Substance Induced Mood Disorder and noncompliance   AXIS II Deferred  AXIS III See medical history.  AXIS IV economic problems, problems related to social environment and problems with primary support group  AXIS V 31-40 impairment in reality testing     Treatment Plan Summary: Restart meds confirmed current meds  at [facility] to keep April 8th appointment with Dr.B Tiburcio Pea.  Agree with H&P from ED .

## 2011-11-02 NOTE — Progress Notes (Signed)
Pt has been in room much of the day today, minimal interaction or participation in the milieu, did speak about wanting to be discharged and spoke some about being a mistake that she was here, pt has not verbalized any complaints today, stated that she is feeling good, has denied any feelings of suicidality, pt has received all medications without incident today, support provided, will continue to monitor

## 2011-11-02 NOTE — H&P (Signed)
I have read the H&P, interviewed the patient, and I agree with the findings above.  Cimone Fahey, MD   

## 2011-11-02 NOTE — Tx Team (Signed)
Initial Interdisciplinary Treatment Plan  PATIENT STRENGTHS: (choose at least two) Capable of independent living Communication skills  PATIENT STRESSORS: Health problems Medication change or noncompliance Difficult to assess due to pt's current state (hostile, uncoop)   PROBLEM LIST: Problem List/Patient Goals Date to be addressed Date deferred Reason deferred Estimated date of resolution  (per pt, no problems/goals)       Delusional thoughts per ED 11/02/2011     Medication noncompliance    "     Decreased insight    "     IDDM    "                              DISCHARGE CRITERIA:  Ability to meet basic life and health needs Adequate post-discharge living arrangements Improved stabilization in mood, thinking, and/or behavior Medical problems require only outpatient monitoring Motivation to continue treatment in a less acute level of care Need for constant or close observation no longer present Verbal commitment to aftercare and medication compliance  PRELIMINARY DISCHARGE PLAN: Outpatient therapy Referrals indicated:  unsure at this time due to pt's current state  PATIENT/FAMIILY INVOLVEMENT: This treatment plan has been presented to and reviewed with the patient, [name], and/or family member.  The patient and family have been given the opportunity to ask questions and make suggestions.  Merian Capron [facility] 11/02/2011, 5:19 AM

## 2011-11-02 NOTE — Progress Notes (Signed)
Adult Psychosocial Assessment Update Interdisciplinary Team  Previous [facility] admissions/discharges:  Admissions Discharges  Date: 05-05-11 Date: 05-09-11  Date: Date:  Date: Date:  Date: Date:  Date: Date:   Changes since the last Psychosocial Assessment (including adherence to outpatient mental health and/or substance abuse treatment, situational issues contributing to decompensation and/or relapse). Pt. Reports going to ER in [place]), pt. Reports being sick and had a misunderstanding with ER doctor. Pt. Reports no problems with medication and says that it was a mistake she was sent here. Pt. Reports being at [facility] in October for medication changes.             Discharge Plan 1. Will you be returning to the same living situation after discharge?   Yes: x No:      If no, what is your plan?    Pt. Will be returning to live with husband in [place]       2. Would you like a referral for services when you are discharged? Yes:     If yes, for what services?  No:       Pt. Is currently seen by [facility] located in  [place]. Pt.'s doctor is Dr. Verlon Au       Summary and Recommendations (to be completed by the evaluator) Pt is a 46 year old female admitted from Ed for hallucinations but states that it was a misunderstanding with the Ed doctor  At [facility] located in [place] and that she does not need to be here. [facility]. [facility] [place] and is seen by Dr. Verlon Au. Pt. Recommendations include: crisis Stabilization, case management, group therapy, and medication management.                       Signature:  Neila Gear, 11/02/2011 9:41 AM

## 2011-11-03 DIAGNOSIS — F3130 Bipolar disorder, current episode depressed, mild or moderate severity, unspecified: Secondary | ICD-10-CM

## 2011-11-03 DIAGNOSIS — F1210 Cannabis abuse, uncomplicated: Secondary | ICD-10-CM | POA: Diagnosis present

## 2011-11-03 LAB — GLUCOSE, CAPILLARY: Glucose-Capillary: 91 mg/dL (ref 70–99)

## 2011-11-03 MED ORDER — METFORMIN HCL 500 MG PO TABS: 500.0000 mg | ORAL_TABLET | Freq: Two times a day (BID) | ORAL | Status: DC

## 2011-11-03 MED ORDER — INSULIN GLARGINE 100 UNIT/ML ~~LOC~~ SOLN: 25.0000 [IU] | Freq: Every day | SUBCUTANEOUS | Status: DC

## 2011-11-03 MED ORDER — ZIPRASIDONE HCL 40 MG PO CAPS: 40.0000 mg | ORAL_CAPSULE | Freq: Two times a day (BID) | ORAL | Status: DC

## 2011-11-03 MED ORDER — LISINOPRIL 10 MG PO TABS: 10.0000 mg | ORAL_TABLET | Freq: Every day | ORAL | Status: DC

## 2011-11-03 MED ORDER — ALPRAZOLAM 1 MG PO TABS: ORAL_TABLET | ORAL | Status: DC

## 2011-11-03 NOTE — Progress Notes (Signed)
Recreation Therapy Notes  11/03/2011         Time: 0930      Group Topic/Focus: The focus of this group is on discussing various styles of communication and communicating assertively using 'I' (feeling) statements.  Participation Level: Active  Participation Quality: Appropriate and Attentive  Affect: Appropriate  Cognitive: Oriented   Additional Comments: None.   Orson Rho 11/03/2011 1:50 PM

## 2011-11-03 NOTE — Progress Notes (Signed)
Patient ID: [name], female   DOB: Nov 22, 1965, 46 y.o.   MRN: [id number] The patient was calm and appropriate. Her conversation was logical and relevant. She interacted well in the milieu. Apologized for her behavior yesterday. Explained that she was angry because she felt the doctor in the ED filled out IVC papers without justification. However, she was going to accept being at [facility] as a positive thing because she needed to find a doctor to prescribe her medications for her. She showered and attended to her ADLs. Compliant with medications.

## 2011-11-03 NOTE — Tx Team (Signed)
Interdisciplinary Treatment Plan Update (Adult)  Date:  11/03/2011  Time Reviewed:  10:15AM-11:00AM  Progress in Treatment: Attending groups:  Yes Participating in groups:    Yes Taking medication as prescribed:    Yes Tolerating medication:   Yes Family/Significant other contact made:  Yes, with husband Patient understands diagnosis:   Yes Discussing patient identified problems/goals with staff:   Yes Medical problems stabilized or resolved:   Yes Denies suicidal/homicidal ideation:  Yes Issues/concerns per patient self-inventory:   None Other:    New problem(s) identified: No, Describe:    Reason for Continuation of Hospitalization: None  Interventions implemented related to continuation of hospitalization:  Medication monitoring and adjustment, safety checks Q15 min., suicide risk assessment, group therapy, psychoeducation, collateral contact, aftercare planning, ongoing physician assessments, medication education - UNTIL DISCHARGE  Additional comments:  Not applicable  Estimated length of stay:  Discharge today  Discharge Plan:  Return to live with husband, follow up to be determined.  Has been at [facility] a long time.  Her previous psychiatrist has left [facility] and started a practice called [facility] in [place], where she wants to go.  New goal(s):  Not applicable  Review of initial/current patient goals per problem list:   1.  Goal(s):  Stabilize on medications  Met:  Yes  Target date:  By Discharge   As evidenced by:  Is stable on current meds  2.  Goal(s):  Decide if / how to address substance abuse.  Met:  Yes  Target date:  By Discharge   As evidenced by:  States does not smoke marijuana often, does not want to address  3.  Goal(s):  Verify follow-up care, between [facility] and new practice [facility].  Met:  Yes  Target date:  By Discharge   As evidenced by:  Wants to  4.  Goal(s):  Return psychotic symptoms/delusions to baseline.  Met:  Yes  Target date:   By Discharge   As evidenced by:  Does not appear to be delusional about pregnancy  Attendees: Patient:  [name]  11/03/2011 10:15AM-11:00AM  Family:     Physician:  Dr. Harvie Heck Readling 11/03/2011 10:15AM-11:00AM  Nursing:   Manuela Schwartz, RN 11/03/2011 10:15AM -11:00AM   Case Manager:  Ambrose Mantle, LCSW 11/03/2011 10:15AM-11:00AM  Counselor:  Veto Kemps, MT-BC 11/03/2011 10:15AM-11:00AM  Other:   Osborn Coho, LCSW-A 11/03/2011 10:15AM-11:00AM  Other:      Other:      Other:       Scribe for Treatment Team:   Sarina Ser, 11/03/2011, 10:15AM-11:15AM

## 2011-11-03 NOTE — Progress Notes (Signed)
Patient ID: [name], female   DOB: 1966/03/15, 46 y.o.   MRN: [id number]   Patient a little drowsy but pleasant on approach today. Currently denies any depression or feelings of hopelessness putting them a "1" on scale. Denies having any intrusive thoughts or a/v hallucinations. No delusions expressed when speaking with her. Staff will monitor and encourage group attendance.

## 2011-11-03 NOTE — Progress Notes (Signed)
Patient ID: [name], female   DOB: [birth year], 46 y.o.   MRN: [id number]   Patient excited about discharge. Team met with patient and felt patient ready for discharge. Obtained belongings, prescriptions, and f/u appointment. Husband came to pick up at this time. No delusions or SI at discharge.

## 2011-11-03 NOTE — Progress Notes (Signed)
[facility] Case Management Discharge Plan:  Will you be returning to the same living situation after discharge: Yes,  home with husband At discharge, do you have transportation home?:Yes,  husband to transport Do you have the ability to pay for your medications:Yes,  insurance and income  Interagency Information:     Release of information consent forms completed and in the chart;  Patient's signature needed at discharge.  Patient to Follow up at:  Follow-up Information    Follow up with Dr. Carroll Sage on 11/04/2011. (3:30PM appointment)    Contact information:   [facility] [address] [place] [address] Telephone:  [phone] Fax:  [phone]         Patient denies SI/HI:   Yes,      Safety Planning and Suicide Prevention discussed:  Yes,  During Aftercare Planning Groups this weekend, Case Manager provided psychoeducation on "Suicide Prevention Information."  This included descriptions of risk factors for suicide, warning signs that an individual is in crisis and thinking of suicide, and what to do if this occurs.  Pt indicated understanding of information provided, and will read brochure given upon discharge.     Barrier to discharge identified:No.  Summary and Recommendations:  Go to see Dr. Tiburcio Pea tomorrow for hospital discharge.   Sarina Ser 11/03/2011, 12:15 PM

## 2011-11-03 NOTE — [facility] Suicide Risk Assessment (Signed)
Suicide Risk Assessment  Discharge Assessment     Demographic factors:  Caucasian  Current Mental Status Per Nursing Assessment::   On Admission:  Thoughts of violence towards others At Discharge:  AO x 3.  No SI/HI.  No Auditory or visual hallucinations or delusional thinking.  Current Mental Status Per Physician:  Diagnosis:  Axis I:  Schizoaffective Disorder - Bipolar Type.  Cannabis Abuse.  The patient was seen today and reports the following:   Sleep: The patient reports to sleeping well last night.  Appetite: The patient reports a good appetite.   Mild>(1-10) >Severe  Hopelessness (1-10): 0.  Depression (1-10): 1 Anxiety (1-10): 1   Suicidal Ideation: The patient adamantly denied any suicidal ideations today.  Plan: No  Intent: No  Means: No   Homicidal Ideation: The patient adamantly denied any homicidal ideations today.  Plan: No  Intent: No.  Means: No   Eye Contact: Good.  General Appearance/Behavior: The patient was friendly and cooperative during the interview. Motor Behavior: wnl.  Speech: Appropriate in rate and volume with no pressuring of speech today.  Mental Status: Alert and Oriented x 3  Level of Consciousness: Alert  Mood: Euthymic.  Affect: Mildly Constricted.  Anxiety: No anxiety reported today.  Thought Process: wnl.  Thought Content: The patient denied any auditory or visual hallucinations today as well as any delusional thinking. Perception: wnl  Judgment: Fair to Good.  Insight: Fair to Good.  Cognition: Orientated to time, place and person.   Loss Factors: None Noted.  Historical Factors: Impulsivity  Risk Reduction Factors:  Good Family Support.  Good Access to Health Care.   Continued Clinical Symptoms:  Alcohol/Substance Abuse/Dependencies More than one psychiatric diagnosis Previous Psychiatric Diagnoses and Treatments Medical Diagnoses and Treatments/Surgeries Schizoaffective Disorder - Bipolar Type.  Discharge  Diagnoses:   AXIS I:   Schizoaffective Disorder - Bipolar Type.   Cannabis Abuse. AXIS II:   Deferred. AXIS III:   1. Fibromyalgia.   2. Hypertension.   3. Diabetes Mellitus. AXIS IV:   Chronic Mental Illness. AXIS V:   GAF at time of admission approximately 40.  GAF at time of discharge approximately 55.  Cognitive Features That Contribute To Risk:  None Noted.    Time was spent today discussing with the patient her current symptoms. The patient states that she is sleeping well and reports a good appetite. She denies any significant depressive symptoms as well as any anxiety.  She also denies any auditory or visual hallucinations or delusional thinking.  The patient also adamantly denies any suicidal or homicidal ideations and asked for discharge today.     Treatment Plan Summary:  1. Daily contact with patient to assess and evaluate symptoms and progress in treatment  2. Medication management  3. The patient will deny suicidal ideations or homicidal ideations for 48 hours prior to discharge and have a depression and anxiety rating of 3 or less. The patient will also deny any auditory or visual hallucinations or delusional thinking or display any manic or hypomanic behaviors.  4. The patient will deny any symptoms of substance withdrawal at time of discharge.  Plan:  1.  Will continue the patient on her current medications.  2.  Laboratory studies reviewed.  3.  Will continue to monitor.  4.  The patient will be discharged as requested to outpatient follow up.  Suicide Risk:  Minimal: No identifiable suicidal ideation.  Patients presenting with no risk factors but with morbid ruminations; may be classified  as minimal risk based on the severity of the depressive symptoms  Plan Of Care/Follow-up recommendations:  Activity:  As tolerated. Diet:  Diabetic Diet. Other:  Please take all medications only as directed and keep all scheduled follow up appointments.  [name] 11/03/2011, 12:58 PM

## 2011-11-03 NOTE — Discharge Planning (Signed)
This Clinical research associate was able to have pt sign a release form to contact [facility]. This Clinical research associate explained to pt the purpose of needing a signed release form. Pt verbalized understanding. This Clinical research associate was able to contact [facility] and cancel pt's appointment that was scheduled for today. [facility] was unable to reschedule another appointment and informed this writer to contact [facility] once pt is d/c to schedule an appointment.

## 2011-11-05 NOTE — Progress Notes (Signed)
Patient Discharge Instructions:  Psychiatric Admission Assessment Note Provided,  11/05/2011 After Visit Summary (AVS) Provided,  11/05/2011 Face Sheet Provided, 11/05/2011 Faxed/Sent to the Next Level Care provider:  11/05/2011 Sent Suicide Risk Assessment - Discharge Assessment 11/05/2011  Faxed to [facility] Carroll Sage @ [phone]  Wandra Scot, 11/05/2011, 2:36 PM

## 2011-11-10 ENCOUNTER — Encounter (HOSPITAL_COMMUNITY): Payer: Self-pay | Admitting: *Deleted

## 2011-11-10 ENCOUNTER — Emergency Department (HOSPITAL_COMMUNITY)
Admission: EM | Admit: 2011-11-10 | Discharge: 2011-11-10 | Disposition: A | Discharge status: Home / Self Care | Payer: Medicaid Other | Attending: Emergency Medicine | Admitting: Emergency Medicine

## 2011-11-10 DIAGNOSIS — IMO0001 Reserved for inherently not codable concepts without codable children: Secondary | ICD-10-CM | POA: Insufficient documentation

## 2011-11-10 DIAGNOSIS — M79605 Pain in left leg: Secondary | ICD-10-CM

## 2011-11-10 DIAGNOSIS — I10 Essential (primary) hypertension: Secondary | ICD-10-CM | POA: Insufficient documentation

## 2011-11-10 DIAGNOSIS — E119 Type 2 diabetes mellitus without complications: Secondary | ICD-10-CM | POA: Insufficient documentation

## 2011-11-10 DIAGNOSIS — M79609 Pain in unspecified limb: Secondary | ICD-10-CM | POA: Insufficient documentation

## 2011-11-10 DIAGNOSIS — F319 Bipolar disorder, unspecified: Secondary | ICD-10-CM | POA: Insufficient documentation

## 2011-11-10 MED ORDER — HYDROMORPHONE HCL PF 1 MG/ML IJ SOLN
1.0000 mg | Freq: Once | INTRAMUSCULAR | Status: AC
  Administered 2011-11-10: 1 mg via INTRAMUSCULAR
  Filled 2011-11-10: qty 1

## 2011-11-10 MED ORDER — IBUPROFEN 800 MG PO TABS
800.0000 mg | ORAL_TABLET | Freq: Once | ORAL | Status: AC
  Administered 2011-11-10: 800 mg via ORAL
  Filled 2011-11-10: qty 1

## 2011-11-10 MED ORDER — CYCLOBENZAPRINE HCL 10 MG PO TABS
10.0000 mg | ORAL_TABLET | Freq: Once | ORAL | Status: AC
  Administered 2011-11-10: 10 mg via ORAL
  Filled 2011-11-10: qty 1

## 2011-11-10 MED ORDER — IBUPROFEN 800 MG PO TABS: 800.0000 mg | ORAL_TABLET | Freq: Three times a day (TID) | ORAL | Status: AC

## 2011-11-10 MED ORDER — CYCLOBENZAPRINE HCL 10 MG PO TABS: 10.0000 mg | ORAL_TABLET | Freq: Two times a day (BID) | ORAL | Status: AC | PRN

## 2011-11-10 NOTE — Discharge Instructions (Signed)
Fibromyalgia  Fibromyalgia is a disorder that is often misunderstood. It is associated with muscular pains and tenderness that comes and goes. It is often associated with fatigue and sleep disturbances. Though it tends to be long-lasting, fibromyalgia is not life-threatening.  CAUSES  The exact cause of fibromyalgia is unknown. People with certain gene types are predisposed to developing fibromyalgia and other conditions. Certain factors can play a role as triggers, such as:  Spine disorders.  Arthritis.  Severe injury (trauma) and other physical stressors.  Emotional stressors.  SYMPTOMS  The main symptom is pain and stiffness in the muscles and joints, which can vary over time.  Sleep and fatigue problems.  Other related symptoms may include:  Bowel and bladder problems.  Headaches.  Visual problems.  Problems with odors and noises.  Depression or mood changes.  Painful periods (dysmenorrhea).  Dryness of the skin or eyes.  DIAGNOSIS  There are no specific tests for diagnosing fibromyalgia. Patients can be diagnosed accurately from the specific symptoms they have. The diagnosis is made by determining that nothing else is causing the problems.  TREATMENT  There is no cure. Management includes medicines and an active, healthy lifestyle. The goal is to enhance physical fitness, decrease pain, and improve sleep.  HOME CARE INSTRUCTIONS  Only take over-the-counter or prescription medicines as directed by your caregiver. Sleeping pills, tranquilizers, and pain medicines may make your problems worse.  Low-impact aerobic exercise is very important and advised for treatment. At first, it may seem to make pain worse. Gradually increasing your tolerance will overcome this feeling.  Learning relaxation techniques and how to control stress will help you. Biofeedback, visual imagery, hypnosis, muscle relaxation, yoga, and meditation are all options.  Anti-inflammatory medicines and physical therapy  may provide short-term help.  Acupuncture or massage treatments may help.  Take muscle relaxant medicines as suggested by your caregiver.  Avoid stressful situations.  Plan a healthy lifestyle. This includes your diet, sleep, rest, exercise, and friends.  Find and practice a hobby you enjoy.  Join a fibromyalgia support group for interaction, ideas, and sharing advice. This may be helpful.  SEEK MEDICAL CARE IF:  You are not having good results or improvement from your treatment.  FOR MORE INFORMATION  National Fibromyalgia Association: www.fmaware.org  Arthritis Foundation: www.arthritis.org

## 2011-11-10 NOTE — ED Provider Notes (Signed)
History     CSN: [id number]  Arrival date & time 11/10/11  0018   First MD Initiated Contact with Patient 11/10/11 0056      Chief Complaint  Patient presents with  . Leg Pain    (Consider location/radiation/quality/duration/timing/severity/associated sxs/prior treatment) HPI History provided by patient. She presents complaining of fibromyalgia flare. She was previously treated with Lyrica but has not changed physicians is currently not prescribed. Chest is without layer the flares are more common and frequent. Today she is having bilateral thigh and knee pains consistent with her typical fibromyalgia.  No trauma. No rash or leg swelling. No chest pain or shortness of breath. Moderate in severity. Pain sharp in quality. She is scheduled to see Dr. Mikeal Hawthorne in the clinic requesting something until she can be evaluated by him. Patient understands that no narcotic prescriptions will be given.   Past Medical History  Diagnosis Date  . Bipolar 1 disorder   . Diabetes mellitus   . Hypertension   . Fibromyalgia     Past Surgical History  Procedure Date  . Mandible surgery   . Cholecystectomy     History reviewed. No pertinent family history.  History  Substance Use Topics  . Smoking status: Current Everyday Smoker -- 1.0 packs/day for 30 years    Types: Cigarettes  . Smokeless tobacco: Not on file  . Alcohol Use: No    OB History    Grav Para Term Preterm Abortions TAB SAB Ect Mult Living   2               Review of Systems  Constitutional: Negative for fever and chills.  HENT: Negative for neck pain and neck stiffness.   Eyes: Negative for pain.  Respiratory: Negative for shortness of breath.   Cardiovascular: Negative for chest pain.  Gastrointestinal: Negative for abdominal pain.  Genitourinary: Negative for dysuria.  Musculoskeletal: Negative for back pain, joint swelling and gait problem.  Skin: Negative for rash.  Neurological: Negative for headaches.  All  other systems reviewed and are negative.    Allergies  Abilify; Cogentin; Haldol; and Procaine hcl  Home Medications   Current Outpatient Rx  Name Route Sig Dispense Refill  . ALPRAZOLAM 1 MG PO TABS  Take one half tablet (.5mg ) up to three times daily as needed for anxiety.    . INSULIN GLARGINE 100 UNIT/ML [place] SOLN Subcutaneous Inject 25 Units into the skin at bedtime. For diabetes. 10 mL   . LISINOPRIL 10 MG PO TABS Oral Take 1 tablet (10 mg total) by mouth daily. For blood pressure and kidney protection.    Marland Kitchen METFORMIN HCL 500 MG PO TABS Oral Take 1 tablet (500 mg total) by mouth 2 (two) times daily with a meal. For diabetes.    Marland Kitchen METOCLOPRAMIDE HCL 10 MG PO TABS Oral Take 1 tablet (10 mg total) by mouth every 6 (six) hours as needed (nausea). 30 tablet 0  . ZIPRASIDONE HCL 40 MG PO CAPS Oral Take 1 capsule (40 mg total) by mouth 2 (two) times daily with a meal. For clear thoughts. 60 capsule 0    BP 114/63  Pulse 75  Temp(Src) 97.8 F (36.6 C) (Oral)  Resp 22  Ht 5\' 1"  (1.549 m)  Wt 225 lb (102.059 kg)  BMI 42.51 kg/m2  SpO2 98%  LMP 09/08/2011  Breastfeeding? Unknown  Physical Exam  Constitutional: She is oriented to person, place, and time. She appears well-developed and well-nourished.  HENT:  Head: Normocephalic  and atraumatic.  Eyes: Conjunctivae and EOM are normal. Pupils are equal, round, and reactive to light.  Neck: Full passive range of motion without pain. Neck supple. No thyromegaly present.       No meningismus  Cardiovascular: Normal rate, regular rhythm, S1 normal, S2 normal and intact distal pulses.   Pulmonary/Chest: Effort normal and breath sounds normal.  Abdominal: Soft. Bowel sounds are normal. There is no tenderness. There is no CVA tenderness.  Musculoskeletal: Normal range of motion.       Localizes discomfort to bilateral thighs and knees some tenderness to palpation throughout but no cords, swelling with negative Homans sign distal neurovascular  intact x4. No rashes, fluctuance with skin intact throughout.  Neurological: She is alert and oriented to person, place, and time. She has normal strength and normal reflexes. No cranial nerve deficit or sensory deficit. She displays a negative Romberg sign. GCS eye subscore is 4. GCS verbal subscore is 5. GCS motor subscore is 6.       Normal Gait  Skin: Skin is warm and dry. No rash noted. No cyanosis. Nails show no clubbing.  Psychiatric: She has a normal mood and affect. Her speech is normal and behavior is normal.    ED Course  Procedures (including critical care time)  Pain medications provided. Symptoms improving on recheck. No indication for emergent imaging or blood work at this time.  MDM   Leg pain that patient attributes to fibromyalgia. She has no evidence of trauma or clinical evidence of infectious process. There is no calf tenderness or swelling or clinical DVT use. Doubt myositis. Given improving symptoms and no deficits plan prescription for anti-inflammatories and patient agrees to keep her scheduled followup with Dr. Mikeal Hawthorne on May 6. She is stable for discharge home at this time. She is a reliable historian states understanding all discharge and followup instructions and return precautions.        Sunnie Nielsen, MD 11/10/11 0130

## 2011-11-10 NOTE — ED Notes (Signed)
Pt reports history of fibromyalgia.  States that she is having increased pain in both legs.  Pt reporting she does have upcoming appointment with physician in [place] on 5/6, but unable to see anyone till then.

## 2011-11-10 NOTE — ED Notes (Signed)
Left in c/o family for transport home; alert, in no distress. 

## 2011-11-11 ENCOUNTER — Encounter (HOSPITAL_COMMUNITY): Payer: Self-pay

## 2011-11-11 ENCOUNTER — Emergency Department (HOSPITAL_COMMUNITY)
Admission: EM | Admit: 2011-11-11 | Discharge: 2011-11-11 | Disposition: A | Discharge status: Home / Self Care | Payer: Medicaid Other | Attending: Emergency Medicine | Admitting: Emergency Medicine

## 2011-11-11 DIAGNOSIS — Z9089 Acquired absence of other organs: Secondary | ICD-10-CM | POA: Insufficient documentation

## 2011-11-11 DIAGNOSIS — IMO0001 Reserved for inherently not codable concepts without codable children: Secondary | ICD-10-CM | POA: Insufficient documentation

## 2011-11-11 DIAGNOSIS — I10 Essential (primary) hypertension: Secondary | ICD-10-CM | POA: Insufficient documentation

## 2011-11-11 DIAGNOSIS — F17200 Nicotine dependence, unspecified, uncomplicated: Secondary | ICD-10-CM | POA: Insufficient documentation

## 2011-11-11 DIAGNOSIS — M797 Fibromyalgia: Secondary | ICD-10-CM

## 2011-11-11 DIAGNOSIS — F319 Bipolar disorder, unspecified: Secondary | ICD-10-CM | POA: Insufficient documentation

## 2011-11-11 DIAGNOSIS — E119 Type 2 diabetes mellitus without complications: Secondary | ICD-10-CM | POA: Insufficient documentation

## 2011-11-11 MED ORDER — TRAMADOL HCL 50 MG PO TABS: 50.0000 mg | ORAL_TABLET | Freq: Four times a day (QID) | ORAL | Status: AC | PRN

## 2011-11-11 NOTE — ED Provider Notes (Signed)
History     CSN: [id number]  Arrival date & time 11/11/11  1332   First MD Initiated Contact with Patient 11/11/11 1354      Chief Complaint  Patient presents with  . Fibromyalgia    (Consider location/radiation/quality/duration/timing/severity/associated sxs/prior treatment) HPI Comments: Patient presents for treatment of her chronic fibromyalgia pain.  She states she has near constant chronic pain in her bilateral lower extremities her lower back her upper back and her shoulders which is consistent with her fibromyalgia pain which she describes as sharp in quality..  She was recently followed by Dr. Delbert Harness, but is now awaiting to establish care with Dr. August Luz in [place] whom she is scheduled to see on May 6.  In the interim, she is having an acute fibromyalgia flare and is requesting pain medication.  She was seen here yesterday for the same symptoms, and was prescribed cyclobenzaprine and ibuprofen which has not relieved her symptoms.  She denies trauma, fevers and chills, no joint pain or swelling, no rash, no nausea vomiting or diarrhea.  She has found no alleviating or sore her symptoms.  She was on Lyrica at one point which was helpful, but has not been on this medicine in over 2 years.    The history is provided by the patient.    Past Medical History  Diagnosis Date  . Bipolar 1 disorder   . Diabetes mellitus   . Hypertension   . Fibromyalgia     Past Surgical History  Procedure Date  . Mandible surgery   . Cholecystectomy     No family history on file.  History  Substance Use Topics  . Smoking status: Current Everyday Smoker -- 1.0 packs/day for 30 years    Types: Cigarettes  . Smokeless tobacco: Not on file  . Alcohol Use: No    OB History    Grav Para Term Preterm Abortions TAB SAB Ect Mult Living   2               Review of Systems  Constitutional: Negative for fever.  HENT: Negative for congestion, sore throat and neck pain.   Eyes: Negative.    Respiratory: Negative for chest tightness and shortness of breath.   Cardiovascular: Negative for chest pain.  Gastrointestinal: Negative for nausea and abdominal pain.  Genitourinary: Negative.   Musculoskeletal: Positive for arthralgias. Negative for joint swelling.  Skin: Negative.  Negative for rash and wound.  Neurological: Negative for dizziness, weakness, light-headedness, numbness and headaches.  Hematological: Negative.   Psychiatric/Behavioral: Negative.     Allergies  Abilify; Cogentin; Haldol; and Procaine hcl  Home Medications   Current Outpatient Rx  Name Route Sig Dispense Refill  . ALPRAZOLAM 1 MG PO TABS  Take one half tablet (.5mg ) up to three times daily as needed for anxiety.    . CYCLOBENZAPRINE HCL 10 MG PO TABS Oral Take 1 tablet (10 mg total) by mouth 2 (two) times daily as needed for muscle spasms. 20 tablet 0  . IBUPROFEN 800 MG PO TABS Oral Take 1 tablet (800 mg total) by mouth 3 (three) times daily. 21 tablet 0  . INSULIN GLARGINE 100 UNIT/ML [place] SOLN Subcutaneous Inject 25 Units into the skin at bedtime. For diabetes. 10 mL   . LISINOPRIL 10 MG PO TABS Oral Take 1 tablet (10 mg total) by mouth daily. For blood pressure and kidney protection.    Marland Kitchen METFORMIN HCL 500 MG PO TABS Oral Take 1 tablet (500 mg total)  by mouth 2 (two) times daily with a meal. For diabetes.    Marland Kitchen METOCLOPRAMIDE HCL 10 MG PO TABS Oral Take 1 tablet (10 mg total) by mouth every 6 (six) hours as needed (nausea). 30 tablet 0  . TRAMADOL HCL 50 MG PO TABS Oral Take 1 tablet (50 mg total) by mouth every 6 (six) hours as needed for pain. 15 tablet 0  . ZIPRASIDONE HCL 40 MG PO CAPS Oral Take 1 capsule (40 mg total) by mouth 2 (two) times daily with a meal. For clear thoughts. 60 capsule 0    BP 105/52  Pulse 77  Temp(Src) 97.5 F (36.4 C) (Oral)  Resp 16  SpO2 98%  LMP 09/08/2011  Physical Exam  Nursing note and vitals reviewed. Constitutional: She is oriented to person, place, and  time. She appears well-developed and well-nourished.  HENT:  Head: Normocephalic and atraumatic.  Eyes: Conjunctivae are normal.  Neck: Normal range of motion and full passive range of motion without pain. Neck supple.  Cardiovascular: Normal rate, regular rhythm, normal heart sounds and intact distal pulses.   Pulmonary/Chest: Effort normal and breath sounds normal. She has no wheezes.  Abdominal: Soft. Bowel sounds are normal. There is no tenderness.  Musculoskeletal: Normal range of motion.       Tender to palpation along lower lumbar bilaterally, bilateral thighs knees and calves.  No peripheral edema, negative Homans sign, no palpable cords in lower extremities.  No rash, erythema or edema present.  Neurological: She is alert and oriented to person, place, and time.  Skin: Skin is warm and dry.  Psychiatric: She has a normal mood and affect.    ED Course  Procedures (including critical care time)  Labs Reviewed - No data to display No results found.   1. Fibromyalgia muscle pain       MDM  Flare of fibromyalgia pain.  No risk factors or calf tenderness or swelling to suggest DVT.  No rash or erythema, no lesions suggesting infectious process.  Patient encouraged to continue the medications she was prescribed yesterday.  Prescribed tramadol for additional pain relief.  Encouraged to keep her appointment with Dr. August Luz on May 6 to establish care and ongoing treatment of her known fibromyalgia.        Candis Musa, PA 11/11/11 [phone]

## 2011-11-11 NOTE — Discharge Instructions (Signed)

## 2011-11-11 NOTE — ED Notes (Signed)
Pt c/o pain in legs from her fibromyalgia. Pt reports, " I am suppose to see my Doctor May 6th"

## 2011-11-11 NOTE — ED Notes (Signed)
Bilateral leg pain, chronic , due to fibromyalgia, No recent injury.  No MD to order meds until May.  Alert, NAD

## 2011-11-12 NOTE — ED Provider Notes (Signed)
Medical screening examination/treatment/procedure(s) were performed by non-physician practitioner and as supervising physician I was immediately available for consultation/collaboration.   Benny Lennert, MD 11/12/11 [phone]

## 2011-11-19 NOTE — Discharge Summary (Signed)
Physician Discharge Summary Note  Patient:  [name] is an 46 y.o., female MRN:  [id number] DOB:  February 04, 1966 Patient phone:  [phone] (home)  Patient address:   [address] [place] [address],   Date of Admission:  11/02/2011 Date of Discharge: 11/03/2011  Axis Diagnosis:   AXIS I:  Bipolar disorder, depressed AXIS II:  No diagnosis AXIS III:  DM II, controlled; HTN; Fibromyalgia.  Past Medical History  Diagnosis Date  . Bipolar 1 disorder   . Diabetes mellitus   . Hypertension   . Fibromyalgia    AXIS IV:  deferred AXIS V:  61-70 mild symptoms  Level of Care:  OP  Hospital Course:    This was a brief admission [name] who initially presented in our emergency room having thoughts that she was possibly pregnant with twins. She became agitated after her her pregnancy test came back negative, and this was confirmed with a Quantitative hCG test. Urine drug screen at that time revealed positive for benzodiazepines, opiates, and cannabis.   She was admitted for acute stabilization unit for further evaluation. We elected to continue on her outpatient dose of Geodon for mood stability.She responded well, and was discharged the next day when she was found to be a fully alert, in full contact with reality, calm and cooperative, with no dangerous ideas.   Consults:  None  Discharge Vitals:   Blood pressure 105/73, pulse 87, temperature 97.9 F (36.6 C), temperature source Oral, resp. rate 20, height 5' (1.524 m), weight 106.142 kg (234 lb), last menstrual period 05/24/2011.  Mental Status Exam: See Mental Status Examination and Suicide Risk Assessment completed by Attending Physician prior to discharge.  Discharge destination:  Home  Is patient on multiple antipsychotic therapies at discharge:  No   Has Patient had three or more failed trials of antipsychotic monotherapy by history:  No  Recommended Plan for Multiple Antipsychotic Therapies: n/a   Medication  List  As of 11/19/2011  8:28 AM   TAKE these medications      Indication    ALPRAZolam 1 MG tablet   Commonly known as: XANAX   Take one half tablet (.5mg ) up to three times daily as needed for anxiety.       HYDROcodone-acetaminophen 5-325 MG per tablet   Commonly known as: NORCO   Take 1 tablet by mouth every 4 (four) hours as needed for pain.       insulin glargine 100 UNIT/ML injection   Commonly known as: LANTUS   Inject 25 Units into the skin at bedtime. For diabetes.       lisinopril 10 MG tablet   Commonly known as: PRINIVIL,ZESTRIL   Take 1 tablet (10 mg total) by mouth daily. For blood pressure and kidney protection.       metFORMIN 500 MG tablet   Commonly known as: GLUCOPHAGE   Take 1 tablet (500 mg total) by mouth 2 (two) times daily with a meal. For diabetes.       metoCLOPramide 10 MG tablet   Commonly known as: REGLAN   Take 1 tablet (10 mg total) by mouth every 6 (six) hours as needed (nausea).       ziprasidone 40 MG capsule   Commonly known as: GEODON   Take 1 capsule (40 mg total) by mouth 2 (two) times daily with a meal. For clear thoughts.            Follow-up Information    Follow up with Dr.  Carroll Sage on 11/04/2011. (3:30PM appointment)    Contact information:   [facility] [address] [place] [address] Telephone:  [phone] Fax:  [phone]         Follow-up recommendations:  Activity:  unrestricted Diet:  regular  Signed: Adebayo Ensminger A 11/19/2011, 8:28 AM

## 2011-11-27 ENCOUNTER — Encounter (HOSPITAL_COMMUNITY): Payer: Self-pay | Admitting: Emergency Medicine

## 2011-11-27 ENCOUNTER — Emergency Department (HOSPITAL_COMMUNITY)
Admission: EM | Admit: 2011-11-27 | Discharge: 2011-11-27 | Disposition: A | Discharge status: Home / Self Care | Payer: Medicaid Other | Attending: Emergency Medicine | Admitting: Emergency Medicine

## 2011-11-27 DIAGNOSIS — M79609 Pain in unspecified limb: Secondary | ICD-10-CM | POA: Insufficient documentation

## 2011-11-27 DIAGNOSIS — M797 Fibromyalgia: Secondary | ICD-10-CM

## 2011-11-27 DIAGNOSIS — M25569 Pain in unspecified knee: Secondary | ICD-10-CM | POA: Insufficient documentation

## 2011-11-27 DIAGNOSIS — Z79899 Other long term (current) drug therapy: Secondary | ICD-10-CM | POA: Insufficient documentation

## 2011-11-27 DIAGNOSIS — F17200 Nicotine dependence, unspecified, uncomplicated: Secondary | ICD-10-CM | POA: Insufficient documentation

## 2011-11-27 DIAGNOSIS — M545 Low back pain, unspecified: Secondary | ICD-10-CM | POA: Insufficient documentation

## 2011-11-27 DIAGNOSIS — F319 Bipolar disorder, unspecified: Secondary | ICD-10-CM | POA: Insufficient documentation

## 2011-11-27 DIAGNOSIS — G8929 Other chronic pain: Secondary | ICD-10-CM | POA: Insufficient documentation

## 2011-11-27 DIAGNOSIS — I10 Essential (primary) hypertension: Secondary | ICD-10-CM | POA: Insufficient documentation

## 2011-11-27 DIAGNOSIS — Z794 Long term (current) use of insulin: Secondary | ICD-10-CM | POA: Insufficient documentation

## 2011-11-27 DIAGNOSIS — Z9889 Other specified postprocedural states: Secondary | ICD-10-CM | POA: Insufficient documentation

## 2011-11-27 DIAGNOSIS — M25519 Pain in unspecified shoulder: Secondary | ICD-10-CM | POA: Insufficient documentation

## 2011-11-27 DIAGNOSIS — E119 Type 2 diabetes mellitus without complications: Secondary | ICD-10-CM | POA: Insufficient documentation

## 2011-11-27 DIAGNOSIS — IMO0001 Reserved for inherently not codable concepts without codable children: Secondary | ICD-10-CM | POA: Insufficient documentation

## 2011-11-27 MED ORDER — OXYCODONE-ACETAMINOPHEN 5-325 MG PO TABS: 1.0000 | ORAL_TABLET | Freq: Once | ORAL | Status: DC

## 2011-11-27 MED ORDER — HYDROMORPHONE HCL PF 1 MG/ML IJ SOLN
1.0000 mg | Freq: Once | INTRAMUSCULAR | Status: AC
  Administered 2011-11-27: 1 mg via INTRAMUSCULAR
  Filled 2011-11-27: qty 1

## 2011-11-27 MED ORDER — OXYCODONE-ACETAMINOPHEN 5-325 MG PO TABS: 1.0000 | ORAL_TABLET | Freq: Four times a day (QID) | ORAL | Status: AC | PRN

## 2011-11-27 MED ORDER — KETOROLAC TROMETHAMINE 60 MG/2ML IM SOLN
60.0000 mg | Freq: Once | INTRAMUSCULAR | Status: AC
  Administered 2011-11-27: 60 mg via INTRAMUSCULAR
  Filled 2011-11-27: qty 2

## 2011-11-27 NOTE — Discharge Instructions (Signed)
You may take the oxycodone prescribed for pain relief.  This will make you drowsy - do not drive within 4 hours of taking this medication. ° °

## 2011-11-27 NOTE — ED Notes (Signed)
Pt reports onset of more severe pain this am. Has chronic back pain.

## 2011-11-29 NOTE — ED Provider Notes (Signed)
History     CSN: [id number]  Arrival date & time 11/27/11  1210   First MD Initiated Contact with Patient 11/27/11 1231      Chief Complaint  Patient presents with  . Back Pain    (Consider location/radiation/quality/duration/timing/severity/associated sxs/prior treatment) HPI Comments: Patient presents for treatment of her chronic fibromyalgia pain.  Her pain today is constant,  Aching in quality and located mostly in her upper back,  But also reports chronic pain in her upper thighs and shoulders.  She states she has near constant chronic pain in her bilateral lower extremities her lower back her upper back and her shoulders which is consistent with her fibromyalgia pain which she describes as sharp in quality.  She is planning to establish care with Dr. Mikeal Hawthorne on May 6 for further treatment of her condition.   She denies trauma, fevers and chills, no joint pain or swelling, no rash, no nausea vomiting or diarrhea.  She has found no alleviating or sore her symptoms.  She was on Lyrica at one point which was helpful, but has not been on this medicine in over 2 years.    The history is provided by the patient.    Past Medical History  Diagnosis Date  . Bipolar 1 disorder   . Diabetes mellitus   . Hypertension   . Fibromyalgia     Past Surgical History  Procedure Date  . Mandible surgery   . Cholecystectomy     Family History  Problem Relation Age of Onset  . Heart failure Mother   . Heart failure Other     History  Substance Use Topics  . Smoking status: Current Everyday Smoker -- 1.0 packs/day for 30 years    Types: Cigarettes  . Smokeless tobacco: Not on file  . Alcohol Use: No    OB History    Grav Para Term Preterm Abortions TAB SAB Ect Mult Living   4    2  2          Review of Systems  Constitutional: Negative for fever.  HENT: Negative for congestion, sore throat and neck pain.   Eyes: Negative.   Respiratory: Negative for chest tightness and shortness of  breath.   Cardiovascular: Negative for chest pain.  Gastrointestinal: Negative for nausea and abdominal pain.  Genitourinary: Negative.   Musculoskeletal: Positive for back pain and arthralgias. Negative for joint swelling.  Skin: Negative.  Negative for rash and wound.  Neurological: Negative for dizziness, weakness, light-headedness, numbness and headaches.  Hematological: Negative.   Psychiatric/Behavioral: Negative.     Allergies  Aripiprazole; Cogentin; Haldol; and Procaine hcl  Home Medications   Current Outpatient Rx  Name Route Sig Dispense Refill  . ALPRAZOLAM 1 MG PO TABS  Take one half tablet (.5mg ) up to three times daily as needed for anxiety.    . INSULIN GLARGINE 100 UNIT/ML [place] SOLN Subcutaneous Inject 25 Units into the skin at bedtime. For diabetes. 10 mL   . LISINOPRIL 10 MG PO TABS Oral Take 1 tablet (10 mg total) by mouth daily. For blood pressure and kidney protection.    Marland Kitchen METFORMIN HCL 500 MG PO TABS Oral Take 1 tablet (500 mg total) by mouth 2 (two) times daily with a meal. For diabetes.    Marland Kitchen METOCLOPRAMIDE HCL 10 MG PO TABS Oral Take 1 tablet (10 mg total) by mouth every 6 (six) hours as needed (nausea). 30 tablet 0  . OXYCODONE-ACETAMINOPHEN 5-325 MG PO TABS Oral Take 1  tablet by mouth every 6 (six) hours as needed for pain. 15 tablet 0  . ZIPRASIDONE HCL 40 MG PO CAPS Oral Take 1 capsule (40 mg total) by mouth 2 (two) times daily with a meal. For clear thoughts. 60 capsule 0    BP 120/64  Pulse 90  Temp(Src) 97.5 F (36.4 C) (Oral)  Resp 16  Ht 5\' 1"  (1.549 m)  Wt 232 lb (105.235 kg)  BMI 43.84 kg/m2  SpO2 99%  LMP 09/08/2011  Physical Exam  Nursing note and vitals reviewed. Constitutional: She is oriented to person, place, and time. She appears well-developed and well-nourished.  HENT:  Head: Normocephalic and atraumatic.  Eyes: Conjunctivae are normal.  Neck: Normal range of motion and full passive range of motion without pain. Neck supple.    Cardiovascular: Normal rate, regular rhythm, normal heart sounds and intact distal pulses.   Pulmonary/Chest: Effort normal and breath sounds normal. She has no wheezes.  Abdominal: Soft. Bowel sounds are normal. There is no tenderness.  Musculoskeletal: Normal range of motion.       Tender to palpation along lower lumbar bilaterally, bilateral thighs and knees.  No peripheral edema, negative Homans sign, no palpable cords in lower extremities.  No rash, erythema or edema present.  Neurological: She is alert and oriented to person, place, and time.  Skin: Skin is warm and dry.  Psychiatric: She has a normal mood and affect.    ED Course  Procedures (including critical care time)  Labs Reviewed - No data to display No results found.   1. Chronic pain   2. Fibromyalgia       MDM  Pt given toradol 60 mg IM,  Dilaudid 1 mg IM.  Prescribed small quantity of oxycodone.  Discussed with pt that she should use prescription sparingly and should last until she establishes care with Dr. Mikeal Hawthorne.  Pt understands.        Burgess Amor, [place] 11/29/11 1251

## 2011-12-03 NOTE — ED Provider Notes (Signed)
Medical screening examination/treatment/procedure(s) were performed by non-physician practitioner and as supervising physician I was immediately available for consultation/collaboration. Devoria Albe, MD, [facility]   Ward Givens, MD 12/03/11 [phone]

## 2011-12-16 ENCOUNTER — Encounter (HOSPITAL_COMMUNITY): Payer: Self-pay

## 2011-12-16 ENCOUNTER — Emergency Department (HOSPITAL_COMMUNITY)
Admission: EM | Admit: 2011-12-16 | Discharge: 2011-12-16 | Disposition: A | Discharge status: Home / Self Care | Payer: Medicaid Other | Attending: Emergency Medicine | Admitting: Emergency Medicine

## 2011-12-16 DIAGNOSIS — W540XXA Bitten by dog, initial encounter: Secondary | ICD-10-CM | POA: Insufficient documentation

## 2011-12-16 DIAGNOSIS — F17200 Nicotine dependence, unspecified, uncomplicated: Secondary | ICD-10-CM | POA: Insufficient documentation

## 2011-12-16 DIAGNOSIS — E119 Type 2 diabetes mellitus without complications: Secondary | ICD-10-CM | POA: Insufficient documentation

## 2011-12-16 DIAGNOSIS — S61209A Unspecified open wound of unspecified finger without damage to nail, initial encounter: Secondary | ICD-10-CM | POA: Insufficient documentation

## 2011-12-16 DIAGNOSIS — S61259A Open bite of unspecified finger without damage to nail, initial encounter: Secondary | ICD-10-CM

## 2011-12-16 DIAGNOSIS — M549 Dorsalgia, unspecified: Secondary | ICD-10-CM | POA: Insufficient documentation

## 2011-12-16 DIAGNOSIS — T148XXA Other injury of unspecified body region, initial encounter: Secondary | ICD-10-CM | POA: Insufficient documentation

## 2011-12-16 DIAGNOSIS — Z794 Long term (current) use of insulin: Secondary | ICD-10-CM | POA: Insufficient documentation

## 2011-12-16 HISTORY — DX: Anxiety disorder, unspecified: F41.9

## 2011-12-16 HISTORY — DX: Panic disorder (episodic paroxysmal anxiety): F41.0

## 2011-12-16 MED ORDER — IBUPROFEN 800 MG PO TABS
800.0000 mg | ORAL_TABLET | Freq: Once | ORAL | Status: AC
  Administered 2011-12-16: 800 mg via ORAL
  Filled 2011-12-16: qty 1

## 2011-12-16 MED ORDER — AMOXICILLIN-POT CLAVULANATE 875-125 MG PO TABS
1.0000 | ORAL_TABLET | Freq: Once | ORAL | Status: AC
  Administered 2011-12-16: 1 via ORAL
  Filled 2011-12-16: qty 1

## 2011-12-16 MED ORDER — HYDROCODONE-ACETAMINOPHEN 5-325 MG PO TABS
ORAL_TABLET | ORAL | Status: AC
  Administered 2011-12-16: 1
  Filled 2011-12-16: qty 1

## 2011-12-16 MED ORDER — TETANUS-DIPHTH-ACELL PERTUSSIS 5-2.5-18.5 LF-MCG/0.5 IM SUSP
0.5000 mL | Freq: Once | INTRAMUSCULAR | Status: AC
  Administered 2011-12-16: 0.5 mL via INTRAMUSCULAR
  Filled 2011-12-16: qty 0.5

## 2011-12-16 MED ORDER — HYDROCOD POLST-CHLORPHEN POLST 10-8 MG/5ML PO LQCR
5.0000 mL | Freq: Once | ORAL | Status: DC
  Filled 2011-12-16: qty 5

## 2011-12-16 MED ORDER — AMOXICILLIN-POT CLAVULANATE 875-125 MG PO TABS: 1.0000 | ORAL_TABLET | Freq: Two times a day (BID) | ORAL | Status: AC

## 2011-12-16 MED ORDER — HYDROCODONE-ACETAMINOPHEN 5-325 MG PO TABS: 1.0000 | ORAL_TABLET | Freq: Four times a day (QID) | ORAL | Status: AC | PRN

## 2011-12-16 NOTE — ED Notes (Signed)
1. Pt stated she was breaking up a dog fight yesterday --her dog and room-mates dog, bite to right hand ring finger.  Wants tetanus shot. 2. Low back pain for months, pain worse since breaking up dog fight

## 2011-12-16 NOTE — ED Notes (Signed)
Pt stated that the bite occurred at her home. Was  Her room-mates dog and unsure if the dog has rabies shots.

## 2011-12-16 NOTE — ED Notes (Signed)
Superficial puncture to distal phalanx LRF palmar surface.  Pt had bandaid over wound and had cleaned with peroxide pta.  The bandaid was dirty.Cleansed with soap and water.Pt says her back is hurting also

## 2011-12-16 NOTE — Discharge Instructions (Signed)
Animal Bite An animal bite can result in a scratch on the skin, deep open cut, puncture of the skin, crush injury, or tearing away of the skin or a body part. Dogs are responsible for most animal bites. Children are bitten more often than adults. An animal bite can range from very mild to more serious. A small bite from your house pet is no cause for alarm. However, some animal bites can become infected or injure a bone or other tissue. You must seek medical care if:  The skin is broken and bleeding does not slow down or stop after 15 minutes.   The puncture is deep and difficult to clean (such as a cat bite).   Pain, warmth, redness, or pus develops around the wound.   The bite is from a stray animal or rodent. There may be a risk of rabies infection.   The bite is from a snake, raccoon, skunk, fox, coyote, or bat. There may be a risk of rabies infection.   The person bitten has a chronic illness such as diabetes, liver disease, or cancer, or the person takes medicine that lowers the immune system.   There is concern about the location and severity of the bite.  It is important to clean and protect an animal bite wound right away to prevent infection. Follow these steps:  Clean the wound with plenty of water and soap.   Apply an antibiotic cream.   Apply gentle pressure over the wound with a clean towel or gauze to slow or stop bleeding.   Elevate the affected area above the heart to help stop any bleeding.   Seek medical care. Getting medical care within 8 hours of the animal bite leads to the best possible outcome.  DIAGNOSIS  Your caregiver will most likely:  Take a detailed history of the animal and the bite injury.   Perform a wound exam.   Take your medical history.  Blood tests or X-rays may be performed. Sometimes, infected bite wounds are cultured and sent to a lab to identify the infectious bacteria.  TREATMENT  Medical treatment will depend on the location and type of  animal bite as well as the patient's medical history. Treatment may include:  Wound care, such as cleaning and flushing the wound with saline solution, bandaging, and elevating the affected area.   Antibiotics.   Tetanus immunization.   Rabies immunization.   Leaving the wound open to heal. This is often done with animal bites, due to the high risk of infection. However, in certain cases, wound closure with stitches, wound adhesive, skin adhesive strips, or staples may be used.  Infected bites that are left untreated may require intravenous (IV) antibiotics and surgical treatment in the hospital. HOME CARE INSTRUCTIONS  Follow your caregiver's instructions for wound care.   Take all medicines as directed.   If your caregiver prescribes antibiotics, take them as directed. Finish them even if you start to feel better.   Follow up with your caregiver for further exams or immunizations as directed.  You may need a tetanus shot if:  You cannot remember when you had your last tetanus shot.   You have never had a tetanus shot.   The injury broke your skin.  If you get a tetanus shot, your arm may swell, get red, and feel warm to the touch. This is common and not a problem. If you need a tetanus shot and you choose not to have one, there is a   rare chance of getting tetanus. Sickness from tetanus can be serious. SEEK MEDICAL CARE IF:  You notice warmth, redness, soreness, swelling, pus discharge, or a bad smell coming from the wound.   You have a red line on the skin coming from the wound.   You have a fever, chills, or a general ill feeling.   You have nausea or vomiting.   You have continued or worsening pain.   You have trouble moving the injured part.   You have other questions or concerns.  MAKE SURE YOU:  Understand these instructions.   Will watch your condition.   Will get help right away if you are not doing well or get worse.  Document Released: 04/01/2011  Document Revised: 07/03/2011 Document Reviewed: 04/01/2011 [facility] Patient Information 2012 [place], [place].   Wash finger twice daily with soap and water then apply antibiotic ointment.  Take the pain medicine as directed.  Take ibuprofen 800 mg every 8 hrs with food.  Follow up with animal control regarding the dog's vaccination status.

## 2011-12-16 NOTE — ED Provider Notes (Signed)
Medical screening examination/treatment/procedure(s) were performed by non-physician practitioner and as supervising physician I was immediately available for consultation/collaboration.   Joya Gaskins, MD 12/16/11 [phone]

## 2011-12-16 NOTE — ED Notes (Signed)
[facility] called, notified of incident will send officer for report

## 2011-12-16 NOTE — ED Provider Notes (Signed)
History     CSN: [id number]  Arrival date & time 12/16/11  1057   First MD Initiated Contact with Patient 12/16/11 1121      Chief Complaint  Patient presents with  . Animal Bite  . Back Pain    (Consider location/radiation/quality/duration/timing/severity/associated sxs/prior treatment) HPI Comments: Pt states her roommate's pit bull attacked her boxer.  She was bit on the R 4th finger and aggravated her back pain associated with fibromyalgia.  She was injured breaking up the fighting dogs.  Dog's rabies vaccination status.  Displaying no abnormal behavior.  Animal control has been notified.  Patient is a 46 y.o. female presenting with animal bite and back pain. The history is provided by the patient. No language interpreter was used.  Animal Bite  The incident occurred just prior to arrival. The incident occurred at home. There is an injury to the right ring finger. The pain is mild. It is unlikely that a foreign body is present. There have been no prior injuries to these areas. She is right-handed. Her tetanus status is out of date. There were no sick contacts. She has received no recent medical care.  Back Pain     Past Medical History  Diagnosis Date  . Bipolar 1 disorder   . Diabetes mellitus   . Hypertension   . Fibromyalgia   . Anxiety   . Panic attack     Past Surgical History  Procedure Date  . Mandible surgery   . Cholecystectomy     Family History  Problem Relation Age of Onset  . Heart failure Mother   . Heart failure Other     History  Substance Use Topics  . Smoking status: Current Everyday Smoker -- 1.0 packs/day for 30 years    Types: Cigarettes  . Smokeless tobacco: Not on file  . Alcohol Use: No    OB History    Grav Para Term Preterm Abortions TAB SAB Ect Mult Living   4    2  2          Review of Systems  Musculoskeletal: Positive for back pain.    Allergies  Aripiprazole; Cogentin; Haldol; and Procaine hcl  Home Medications    Current Outpatient Rx  Name Route Sig Dispense Refill  . ALPRAZOLAM 1 MG PO TABS Oral Take 1 mg by mouth 4 (four) times daily as needed. for anxiety.    . ALPRAZOLAM 1 MG PO TABS Oral Take 1 mg by mouth 4 (four) times daily as needed. For anxiety    . INSULIN GLARGINE 100 UNIT/ML [place] SOLN Subcutaneous Inject 25 Units into the skin at bedtime. For diabetes. 10 mL   . LISINOPRIL 10 MG PO TABS Oral Take 1 tablet (10 mg total) by mouth daily. For blood pressure and kidney protection.    Marland Kitchen METFORMIN HCL 500 MG PO TABS Oral Take 1 tablet (500 mg total) by mouth 2 (two) times daily with a meal. For diabetes.    . AMOXICILLIN-POT CLAVULANATE 875-125 MG PO TABS Oral Take 1 tablet by mouth every 12 (twelve) hours. 10 tablet 0  . HYDROCODONE-ACETAMINOPHEN 5-325 MG PO TABS Oral Take 1 tablet by mouth every 6 (six) hours as needed for pain. 8 tablet 0    BP 141/83  Pulse 86  Temp(Src) 97.7 F (36.5 C) (Oral)  Resp 20  Ht 5\' 1"  (1.549 m)  Wt 231 lb (104.781 kg)  BMI 43.65 kg/m2  SpO2 100%  Physical Exam  Nursing note and vitals  reviewed. Constitutional: She is oriented to person, place, and time. She appears well-developed and well-nourished. No distress.  HENT:  Head: Normocephalic and atraumatic.  Eyes: EOM are normal.  Neck: Normal range of motion.  Cardiovascular: Normal rate, regular rhythm and normal heart sounds.   Pulmonary/Chest: Effort normal and breath sounds normal.  Abdominal: Soft. She exhibits no distension. There is no tenderness.  Musculoskeletal: Normal range of motion.       Tiny "blood blister" to pad of R 4th finger.  ? Break in skin.    Pt also has generalized muscular back pain.  Neurological: She is alert and oriented to person, place, and time.  Skin: Skin is warm and dry.  Psychiatric: She has a normal mood and affect. Judgment normal.    ED Course  Procedures (including critical care time)  Labs Reviewed - No data to display No results found.   1. Dog  bite of finger   2. Muscle strain       MDM  rx augmentin 875 BID, 14 Tetanus Hydrocodone F/u with animal control and rabies vaccination status.        Worthy Rancher, PA 12/16/11 [phone]

## 2011-12-16 NOTE — ED Notes (Signed)
Pt arrived at front registration desk and told the clerk that she lost her prescription for vicodin, and wanted to speak to the doctor who saw her.  RN was standing at doorway and overheard complaint, pt re-told story to nurse, stated she was in the [place] park reading over her papers and counting her medicine bottles and thinks someone stole her prescription for vicodin and she wanted another one. Advised that she could not get another prescription, and that she could file a report with the police department she became angry.  Advised that it was a felony to report narcotics stolen if they were not, she then stated that "it was only 8 pills anyway" and I'm in pain",   As walking out the door she stated that she has "friends in high places and one is a narcotic officer who can help her"  Pt walked out to waiting truck without event.

## 2012-01-26 ENCOUNTER — Encounter (HOSPITAL_COMMUNITY): Payer: Self-pay | Admitting: *Deleted

## 2012-01-26 ENCOUNTER — Emergency Department (HOSPITAL_COMMUNITY)
Admission: EM | Admit: 2012-01-26 | Discharge: 2012-01-26 | Disposition: A | Discharge status: Home / Self Care | Payer: Medicaid Other | Attending: Emergency Medicine | Admitting: Emergency Medicine

## 2012-01-26 DIAGNOSIS — Z794 Long term (current) use of insulin: Secondary | ICD-10-CM | POA: Insufficient documentation

## 2012-01-26 DIAGNOSIS — IMO0001 Reserved for inherently not codable concepts without codable children: Secondary | ICD-10-CM | POA: Insufficient documentation

## 2012-01-26 DIAGNOSIS — F17200 Nicotine dependence, unspecified, uncomplicated: Secondary | ICD-10-CM | POA: Insufficient documentation

## 2012-01-26 DIAGNOSIS — F319 Bipolar disorder, unspecified: Secondary | ICD-10-CM | POA: Insufficient documentation

## 2012-01-26 DIAGNOSIS — I10 Essential (primary) hypertension: Secondary | ICD-10-CM | POA: Insufficient documentation

## 2012-01-26 DIAGNOSIS — E119 Type 2 diabetes mellitus without complications: Secondary | ICD-10-CM | POA: Insufficient documentation

## 2012-01-26 DIAGNOSIS — R1110 Vomiting, unspecified: Secondary | ICD-10-CM | POA: Insufficient documentation

## 2012-01-26 DIAGNOSIS — R197 Diarrhea, unspecified: Secondary | ICD-10-CM | POA: Insufficient documentation

## 2012-01-26 DIAGNOSIS — R10816 Epigastric abdominal tenderness: Secondary | ICD-10-CM | POA: Insufficient documentation

## 2012-01-26 DIAGNOSIS — K529 Noninfective gastroenteritis and colitis, unspecified: Secondary | ICD-10-CM

## 2012-01-26 DIAGNOSIS — F411 Generalized anxiety disorder: Secondary | ICD-10-CM | POA: Insufficient documentation

## 2012-01-26 DIAGNOSIS — Z79899 Other long term (current) drug therapy: Secondary | ICD-10-CM | POA: Insufficient documentation

## 2012-01-26 DIAGNOSIS — K5289 Other specified noninfective gastroenteritis and colitis: Secondary | ICD-10-CM | POA: Insufficient documentation

## 2012-01-26 LAB — CBC WITH DIFFERENTIAL/PLATELET
Basophils Relative: 1 % (ref 0–1)
HCT: 37.5 % (ref 36.0–46.0)
Hemoglobin: 13.1 g/dL (ref 12.0–15.0)
Lymphs Abs: 2.1 10*3/uL (ref 0.7–4.0)
MCHC: 34.9 g/dL (ref 30.0–36.0)
Monocytes Absolute: 0.4 10*3/uL (ref 0.1–1.0)
Monocytes Relative: 9 % (ref 3–12)
Neutro Abs: 2.2 10*3/uL (ref 1.7–7.7)
RBC: 4.05 MIL/uL (ref 3.87–5.11)

## 2012-01-26 LAB — COMPREHENSIVE METABOLIC PANEL
Albumin: 3.5 g/dL (ref 3.5–5.2)
Alkaline Phosphatase: 68 U/L (ref 39–117)
BUN: 4 mg/dL — ABNORMAL LOW (ref 6–23)
CO2: 24 mEq/L (ref 19–32)
Chloride: 97 mEq/L (ref 96–112)
Creatinine, Ser: 0.63 mg/dL (ref 0.50–1.10)
GFR calc Af Amer: >90 mL/min (ref >90)
GFR calc non Af Amer: >90 mL/min (ref >90)
Glucose, Bld: 97 mg/dL (ref 70–99)
Potassium: 3.4 mEq/L — ABNORMAL LOW (ref 3.5–5.1)
Total Bilirubin: 0.8 mg/dL (ref 0.3–1.2)

## 2012-01-26 MED ORDER — ONDANSETRON HCL 4 MG/2ML IJ SOLN
4.0000 mg | Freq: Once | INTRAMUSCULAR | Status: AC
  Administered 2012-01-26: 4 mg via INTRAVENOUS
  Filled 2012-01-26: qty 2

## 2012-01-26 MED ORDER — KETOROLAC TROMETHAMINE 30 MG/ML IJ SOLN
30.0000 mg | Freq: Once | INTRAMUSCULAR | Status: AC
  Administered 2012-01-26: 30 mg via INTRAVENOUS
  Filled 2012-01-26: qty 1

## 2012-01-26 MED ORDER — PROMETHAZINE HCL 25 MG PO TABS: 25.0000 mg | ORAL_TABLET | Freq: Four times a day (QID) | ORAL | Status: DC | PRN

## 2012-01-26 MED ORDER — SODIUM CHLORIDE 0.9 % IV SOLN
Freq: Once | INTRAVENOUS | Status: AC
  Administered 2012-01-26: 10:00:00 via INTRAVENOUS

## 2012-01-26 NOTE — ED Notes (Signed)
Pt ambulated to bathroom with no difficulty 

## 2012-01-26 NOTE — ED Notes (Signed)
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 When asked what she was talking about she said that I would think she was crazy and she was going to shut up. Pt denies suicidal or homicidal ideations. Smells strongly of urine.

## 2012-01-26 NOTE — ED Provider Notes (Signed)
History   This chart was scribed for [name], MD by Sofie Rower. The patient was seen in room APA07/APA07 and the patient's care was started at 9:49 AM     CSN: [id number]  Arrival date & time 01/26/12  0919   First MD Initiated Contact with Patient 01/26/12 0930      Chief Complaint  Patient presents with  . Emesis    (Consider location/radiation/quality/duration/timing/severity/associated sxs/prior treatment) HPI  [name] is a 46 y.o. female who presents to the Emergency Department complaining of moderate, episodic nausea onset three days ago with associated symptoms of back pain, rib pain, vomiting ( X 1), diarrhea (onset three days ago X 2), loss of appetite. The pt informs the EDP that she has been sleeping on a mattress that is all springs, but she has no choice. Pt reports that the lastPt has a hx of diabetes (onset 8 months ago, usually runs 90-92, 257 yesterday, 100 after insulin shot), cholecystectomy, high blood pressure.  Pt denies any sick contacts.   Pt is a smoker    Past Medical History  Diagnosis Date  . Bipolar 1 disorder   . Diabetes mellitus   . Hypertension   . Fibromyalgia   . Anxiety   . Panic attack     Past Surgical History  Procedure Date  . Mandible surgery   . Cholecystectomy     Family History  Problem Relation Age of Onset  . Heart failure Mother   . Heart failure Other     History  Substance Use Topics  . Smoking status: Current Everyday Smoker -- 1.0 packs/day for 30 years    Types: Cigarettes  . Smokeless tobacco: Not on file  . Alcohol Use: No    OB History    Grav Para Term Preterm Abortions TAB SAB Ect Mult Living   4    2  2          Review of Systems  All other systems reviewed and are negative.    10 Systems reviewed and all are negative for acute change except as noted in the HPI.    Allergies  Aripiprazole; Cogentin; Haldol; and Procaine hcl  Home Medications   Current Outpatient Rx  Name  Route Sig Dispense Refill  . ALPRAZOLAM 1 MG PO TABS Oral Take 1 mg by mouth 4 (four) times daily as needed. for anxiety.    . ALPRAZOLAM 1 MG PO TABS Oral Take 1 mg by mouth 4 (four) times daily as needed. For anxiety    . INSULIN GLARGINE 100 UNIT/ML  SOLN Subcutaneous Inject 25 Units into the skin at bedtime. For diabetes. 10 mL   . LISINOPRIL 10 MG PO TABS Oral Take 1 tablet (10 mg total) by mouth daily. For blood pressure and kidney protection.    Marland Kitchen METFORMIN HCL 500 MG PO TABS Oral Take 1 tablet (500 mg total) by mouth 2 (two) times daily with a meal. For diabetes.      BP 119/72  Pulse 89  Temp 98.3 F (36.8 C) (Oral)  Resp 20  Ht 5\' 1"  (1.549 m)  Wt 230 lb (104.327 kg)  BMI 43.46 kg/m2  SpO2 98%  Physical Exam  Nursing note and vitals reviewed. Constitutional: She appears well-developed and well-nourished.  HENT:  Head: Atraumatic.  Right Ear: External ear normal.  Left Ear: External ear normal.  Nose: Nose normal.  Mouth/Throat: Oropharynx is clear and moist.  Cardiovascular: Normal rate, regular rhythm and normal heart  sounds.   No murmur heard. Pulmonary/Chest: Effort normal and breath sounds normal. No respiratory distress. She has no wheezes.  Abdominal: Soft. Bowel sounds are normal. There is tenderness (Located at the epigastrium.). There is no rebound and no guarding.  Skin: Skin is warm and dry.  Psychiatric: She has a normal mood and affect.    ED Course  Procedures (including critical care time)  DIAGNOSTIC STUDIES: Oxygen Saturation is 98% on room air, normal by my interpretation.    COORDINATION OF CARE:   9:55AM- EDP at bedside discusses treatment plan concerning nausea management, pain management, blood work.   11:50AM- EDP at bedside, pt expresses concern about being poisoned. EDP discusses nausea management.   Results for orders placed during the hospital encounter of 01/26/12  CBC WITH DIFFERENTIAL      Component Value Range   WBC 4.8   4.0 - 10.5 K/uL   RBC 4.05  3.87 - 5.11 MIL/uL   Hemoglobin 13.1  12.0 - 15.0 g/dL   HCT 16.1  09.6 - 04.5 %   MCV 92.6  78.0 - 100.0 fL   MCH 32.3  26.0 - 34.0 pg   MCHC 34.9  30.0 - 36.0 g/dL   RDW 40.9  81.1 - 91.4 %   Platelets 138 (*) 150 - 400 K/uL   Neutrophils Relative 46  43 - 77 %   Neutro Abs 2.2  1.7 - 7.7 K/uL   Lymphocytes Relative 44  12 - 46 %   Lymphs Abs 2.1  0.7 - 4.0 K/uL   Monocytes Relative 9  3 - 12 %   Monocytes Absolute 0.4  0.1 - 1.0 K/uL   Eosinophils Relative 0  0 - 5 %   Eosinophils Absolute 0.0  0.0 - 0.7 K/uL   Basophils Relative 1  0 - 1 %   Basophils Absolute 0.1  0.0 - 0.1 K/uL  COMPREHENSIVE METABOLIC PANEL      Component Value Range   Sodium 133 (*) 135 - 145 mEq/L   Potassium 3.4 (*) 3.5 - 5.1 mEq/L   Chloride 97  96 - 112 mEq/L   CO2 24  19 - 32 mEq/L   Glucose, Bld 97  70 - 99 mg/dL   BUN 4 (*) 6 - 23 mg/dL   Creatinine, Ser 7.82  0.50 - 1.10 mg/dL   Calcium 9.2  8.4 - 95.6 mg/dL   Total Protein 7.6  6.0 - 8.3 g/dL   Albumin 3.5  3.5 - 5.2 g/dL   AST 24  0 - 37 U/L   ALT 16  0 - 35 U/L   Alkaline Phosphatase 68  39 - 117 U/L   Total Bilirubin 0.8  0.3 - 1.2 mg/dL   GFR calc non Af Amer >90  >90 mL/min   GFR calc Af Amer >90  >90 mL/min      No results found.   No diagnosis found.    MDM  The patient presents with symptoms consistent with acute gastroenteritis.  She is feeling somewhat better with fluids and meds (although she did remove the IV because it was hurting).  She will be discharged with phenergan, follow up prn.      I personally performed the services described in this documentation, which was scribed in my presence. The recorded information has been reviewed and considered.      [name], MD 01/26/12 1200

## 2012-01-26 NOTE — Discharge Instructions (Signed)
Nausea and Vomiting  Nausea is a sick feeling that often comes before throwing up (vomiting). Vomiting is a reflex where stomach contents come out of your mouth. Vomiting can cause severe loss of body fluids (dehydration). Children and elderly adults can become dehydrated quickly, especially if they also have diarrhea. Nausea and vomiting are symptoms of a condition or disease. It is important to find the cause of your symptoms.  CAUSES    Direct irritation of the stomach lining. This irritation can result from increased acid production (gastroesophageal reflux disease), infection, food poisoning, taking certain medicines (such as nonsteroidal anti-inflammatory drugs), alcohol use, or tobacco use.   Signals from the brain.These signals could be caused by a headache, heat exposure, an inner ear disturbance, increased pressure in the brain from injury, infection, a tumor, or a concussion, pain, emotional stimulus, or metabolic problems.   An obstruction in the gastrointestinal tract (bowel obstruction).   Illnesses such as diabetes, hepatitis, gallbladder problems, appendicitis, kidney problems, cancer, sepsis, atypical symptoms of a heart attack, or eating disorders.   Medical treatments such as chemotherapy and radiation.   Receiving medicine that makes you sleep (general anesthetic) during surgery.  DIAGNOSIS  Your caregiver may ask for tests to be done if the problems do not improve after a few days. Tests may also be done if symptoms are severe or if the reason for the nausea and vomiting is not clear. Tests may include:   Urine tests.   Blood tests.   Stool tests.   Cultures (to look for evidence of infection).   X-rays or other imaging studies.  Test results can help your caregiver make decisions about treatment or the need for additional tests.  TREATMENT  You need to stay well hydrated. Drink frequently but in small amounts.You may wish to drink water, sports drinks, clear broth, or eat frozen  ice pops or gelatin dessert to help stay hydrated.When you eat, eating slowly may help prevent nausea.There are also some antinausea medicines that may help prevent nausea.  HOME CARE INSTRUCTIONS    Take all medicine as directed by your caregiver.   If you do not have an appetite, do not force yourself to eat. However, you must continue to drink fluids.   If you have an appetite, eat a normal diet unless your caregiver tells you differently.   Eat a variety of complex carbohydrates (rice, wheat, potatoes, bread), lean meats, yogurt, fruits, and vegetables.   Avoid high-fat foods because they are more difficult to digest.   Drink enough water and fluids to keep your urine clear or pale yellow.   If you are dehydrated, ask your caregiver for specific rehydration instructions. Signs of dehydration may include:   Severe thirst.   Dry lips and mouth.   Dizziness.   Dark urine.   Decreasing urine frequency and amount.   Confusion.   Rapid breathing or pulse.  SEEK IMMEDIATE MEDICAL CARE IF:    You have blood or brown flecks (like coffee grounds) in your vomit.   You have black or bloody stools.   You have a severe headache or stiff neck.   You are confused.   You have severe abdominal pain.   You have chest pain or trouble breathing.   You do not urinate at least once every 8 hours.   You develop cold or clammy skin.   You continue to vomit for longer than 24 to 48 hours.   You have a fever.  MAKE SURE YOU:      Understand these instructions.   Will watch your condition.   Will get help right away if you are not doing well or get worse.  Document Released: 07/14/2005 Document Revised: 07/03/2011 Document Reviewed: 12/11/2010  [employer] Patient Information 2012 [employer].

## 2012-01-26 NOTE — ED Notes (Signed)
Pt states that her pain medicine has not helped and that her back "hurts like hell".

## 2012-01-26 NOTE — ED Notes (Signed)
Pt c/o nausea x 3 days and states that she has been unable to eat. Also c/o back and rib pain bilaterally. States that she sleeps on a mattress that "is all springs". Pt states that she has vomited x 1 and had diarrhea x 2 since yesterday.

## 2012-01-26 NOTE — ED Notes (Signed)
Pt c/o pain at IV site. Lying with arm bent. No redness or infiltration noted. Removed per patient request after 500 ml infused.

## 2012-04-12 ENCOUNTER — Emergency Department (HOSPITAL_COMMUNITY)
Admission: EM | Admit: 2012-04-12 | Discharge: 2012-04-12 | Disposition: A | Discharge status: Home / Self Care | Payer: Medicaid Other | Attending: Emergency Medicine | Admitting: Emergency Medicine

## 2012-04-12 ENCOUNTER — Encounter (HOSPITAL_COMMUNITY): Payer: Self-pay | Admitting: *Deleted

## 2012-04-12 ENCOUNTER — Emergency Department (HOSPITAL_COMMUNITY): Payer: Medicaid Other

## 2012-04-12 DIAGNOSIS — M549 Dorsalgia, unspecified: Secondary | ICD-10-CM

## 2012-04-12 DIAGNOSIS — E119 Type 2 diabetes mellitus without complications: Secondary | ICD-10-CM | POA: Insufficient documentation

## 2012-04-12 DIAGNOSIS — Z888 Allergy status to other drugs, medicaments and biological substances status: Secondary | ICD-10-CM | POA: Insufficient documentation

## 2012-04-12 DIAGNOSIS — F17200 Nicotine dependence, unspecified, uncomplicated: Secondary | ICD-10-CM | POA: Insufficient documentation

## 2012-04-12 DIAGNOSIS — Z794 Long term (current) use of insulin: Secondary | ICD-10-CM | POA: Insufficient documentation

## 2012-04-12 DIAGNOSIS — J40 Bronchitis, not specified as acute or chronic: Secondary | ICD-10-CM | POA: Insufficient documentation

## 2012-04-12 DIAGNOSIS — F411 Generalized anxiety disorder: Secondary | ICD-10-CM | POA: Insufficient documentation

## 2012-04-12 DIAGNOSIS — F319 Bipolar disorder, unspecified: Secondary | ICD-10-CM | POA: Insufficient documentation

## 2012-04-12 DIAGNOSIS — I10 Essential (primary) hypertension: Secondary | ICD-10-CM | POA: Insufficient documentation

## 2012-04-12 LAB — URINALYSIS, ROUTINE W REFLEX MICROSCOPIC
Glucose, UA: NEGATIVE mg/dL
Leukocytes, UA: NEGATIVE
Protein, ur: NEGATIVE mg/dL
Specific Gravity, Urine: <1.005 — ABNORMAL LOW (ref 1.005–1.030)
Urobilinogen, UA: 0.2 mg/dL (ref 0.0–1.0)

## 2012-04-12 LAB — CBC WITH DIFFERENTIAL/PLATELET
Basophils Absolute: 0 10*3/uL (ref 0.0–0.1)
Lymphocytes Relative: 42 % (ref 12–46)
Lymphs Abs: 2.6 10*3/uL (ref 0.7–4.0)
Neutro Abs: 3.3 10*3/uL (ref 1.7–7.7)
Neutrophils Relative %: 53 % (ref 43–77)
Platelets: 135 10*3/uL — ABNORMAL LOW (ref 150–400)
RBC: 4.37 MIL/uL (ref 3.87–5.11)
RDW: 12.9 % (ref 11.5–15.5)
WBC: 6.1 10*3/uL (ref 4.0–10.5)

## 2012-04-12 LAB — URINE MICROSCOPIC-ADD ON

## 2012-04-12 LAB — BASIC METABOLIC PANEL
CO2: 24 mEq/L (ref 19–32)
Calcium: 8.9 mg/dL (ref 8.4–10.5)
Glucose, Bld: 111 mg/dL — ABNORMAL HIGH (ref 70–99)
Potassium: 3.2 mEq/L — ABNORMAL LOW (ref 3.5–5.1)
Sodium: 135 mEq/L (ref 135–145)

## 2012-04-12 LAB — PREGNANCY, URINE: Preg Test, Ur: NEGATIVE

## 2012-04-12 MED ORDER — HYDROCODONE-ACETAMINOPHEN 7.5-500 MG/15ML PO SOLN: ORAL | Status: DC

## 2012-04-12 MED ORDER — AMOXICILLIN 500 MG PO CAPS: 500.0000 mg | ORAL_CAPSULE | Freq: Three times a day (TID) | ORAL | Status: DC

## 2012-04-12 MED ORDER — HYDROMORPHONE HCL PF 1 MG/ML IJ SOLN
1.0000 mg | Freq: Once | INTRAMUSCULAR | Status: AC
  Administered 2012-04-12: 1 mg via INTRAMUSCULAR
  Filled 2012-04-12: qty 1

## 2012-04-12 NOTE — ED Notes (Signed)
Patient states she is in severe pain. RN is aware. Patient was asked if she could give a urine sample patient states she will try after she get something for pain.

## 2012-04-12 NOTE — ED Notes (Addendum)
Low back pain, onset this am, cold sx for 2 weeks, chills, cough,  Nausea, vomiting , body aches

## 2012-04-12 NOTE — ED Provider Notes (Signed)
History     CSN: [id number]  Arrival date & time 04/12/12  1355   First MD Initiated Contact with Patient 04/12/12 1746      Chief Complaint  Patient presents with  . Back Pain    (Consider location/radiation/quality/duration/timing/severity/associated sxs/prior treatment) Patient is a 46 y.o. female presenting with back pain. The history is provided by the patient (pt complains of back pain and cough). No language interpreter was used.  Back Pain  This is a recurrent problem. The current episode started more than 2 days ago. The problem occurs constantly. The problem has not changed since onset.The pain is associated with no known injury. The pain is present in the lumbar spine. The quality of the pain is described as stabbing. The pain does not radiate. The pain is at a severity of 6/10. The pain is moderate. The symptoms are aggravated by bending. The pain is the same all the time. Pertinent negatives include no chest pain, no headaches and no abdominal pain. She has tried nothing for the symptoms.    Past Medical History  Diagnosis Date  . Bipolar 1 disorder   . Diabetes mellitus   . Hypertension   . Fibromyalgia   . Anxiety   . Panic attack     Past Surgical History  Procedure Date  . Mandible surgery   . Cholecystectomy     Family History  Problem Relation Age of Onset  . Heart failure Mother   . Heart failure Other     History  Substance Use Topics  . Smoking status: Current Every Day Smoker -- 1.0 packs/day for 30 years    Types: Cigarettes  . Smokeless tobacco: Not on file  . Alcohol Use: No    OB History    Grav Para Term Preterm Abortions TAB SAB Ect Mult Living   4    2  2          Review of Systems  Constitutional: Negative for fatigue.  HENT: Negative for congestion, sinus pressure and ear discharge.   Eyes: Negative for discharge.  Respiratory: Positive for cough.   Cardiovascular: Negative for chest pain.  Gastrointestinal: Negative for  abdominal pain and diarrhea.  Genitourinary: Negative for frequency and hematuria.  Musculoskeletal: Positive for back pain.  Skin: Negative for rash.  Neurological: Negative for seizures and headaches.  Hematological: Negative.   Psychiatric/Behavioral: Negative for hallucinations.    Allergies  Abilify; Aripiprazole; Cogentin; Haldol; and Procaine hcl  Home Medications   Current Outpatient Rx  Name Route Sig Dispense Refill  . ALPRAZOLAM 1 MG PO TABS Oral Take 1 mg by mouth 4 (four) times daily as needed. for anxiety.    . INSULIN GLARGINE 100 UNIT/ML [place] SOLN Subcutaneous Inject 25 Units into the skin at bedtime. For diabetes. 10 mL   . METFORMIN HCL 500 MG PO TABS Oral Take 1 tablet (500 mg total) by mouth 2 (two) times daily with a meal. For diabetes.    . AMOXICILLIN 500 MG PO CAPS Oral Take 1 capsule (500 mg total) by mouth 3 (three) times daily. 21 capsule 0  . HYDROCODONE-ACETAMINOPHEN 7.5-500 MG/15ML PO SOLN  One teaspoon every 4 hours for pain or cough 240 mL 0  . PROMETHAZINE HCL 25 MG PO TABS Oral Take 0.5 tablets (12.5 mg total) by mouth every 6 (six) hours as needed for nausea. 10 tablet 0  . PROMETHAZINE HCL 25 MG PO TABS Oral Take 1 tablet (25 mg total) by mouth every 6 (six)  hours as needed for nausea. 12 tablet 0    BP 123/63  Pulse 71  Temp 97.7 F (36.5 C) (Oral)  Resp 18  Ht 5\' 1"  (1.549 m)  Wt 200 lb (90.719 kg)  BMI 37.79 kg/m2  SpO2 98%  Physical Exam  Constitutional: She is oriented to person, place, and time. She appears well-developed.  HENT:  Head: Normocephalic and atraumatic.  Eyes: Conjunctivae normal and EOM are normal. No scleral icterus.  Neck: Neck supple. No thyromegaly present.  Cardiovascular: Normal rate and regular rhythm.  Exam reveals no gallop and no friction rub.   No murmur heard. Pulmonary/Chest: No stridor. She has no wheezes. She has no rales. She exhibits no tenderness.  Abdominal: She exhibits no distension. There is no  tenderness. There is no rebound.  Musculoskeletal: Normal range of motion. She exhibits no edema.       Tender lumbar spine  Lymphadenopathy:    She has no cervical adenopathy.  Neurological: She is oriented to person, place, and time. She has normal reflexes. Coordination normal.  Skin: No rash noted. No erythema.  Psychiatric: She has a normal mood and affect. Her behavior is normal.    ED Course  Procedures (including critical care time)  Labs Reviewed  CBC WITH DIFFERENTIAL - Abnormal; Notable for the following:    Platelets 135 (*)     All other components within normal limits  BASIC METABOLIC PANEL - Abnormal; Notable for the following:    Potassium 3.2 (*)     Glucose, Bld 111 (*)     BUN 4 (*)     All other components within normal limits  URINALYSIS, ROUTINE W REFLEX MICROSCOPIC - Abnormal; Notable for the following:    Specific Gravity, Urine <1.005 (*)     Hgb urine dipstick TRACE (*)     All other components within normal limits  PREGNANCY, URINE  URINE MICROSCOPIC-ADD ON   Dg Chest 2 View  04/12/2012  *RADIOLOGY REPORT*  Clinical Data: Back pain and cough.  CHEST - 2 VIEW  Comparison: 12/19 and 06/26/2009  Findings: The heart size and pulmonary vascularity are normal and the lungs are clear except for slight peribronchial thickening.  No significant osseous abnormality.  No effusions.  IMPRESSION: Slight bronchitic changes.   Original Report Authenticated By: Gwynn Burly, M.D.    Dg Lumbar Spine Complete  04/12/2012  *RADIOLOGY REPORT*  Clinical Data: Low back pain  LUMBAR SPINE - COMPLETE 4+ VIEW  Comparison: 12/15/2008  Findings: Negative for fracture.  Normal alignment.  Mild anterior spurring L2-3 and L3-4.  Disc spaces are maintained.  No pars defect.  IMPRESSION: Mild disc degeneration and spurring L2-3 and L3-4.  No acute bony abnormality.   Original Report Authenticated By: Camelia Phenes, M.D.      1. Bronchitis   2. Back pain       MDM          Benny Lennert, MD 04/12/12 2154

## 2012-04-22 ENCOUNTER — Emergency Department (HOSPITAL_COMMUNITY)
Admission: EM | Admit: 2012-04-22 | Discharge: 2012-04-23 | Disposition: A | Discharge status: Home / Self Care | Payer: Medicaid Other | Attending: Emergency Medicine | Admitting: Emergency Medicine

## 2012-04-22 ENCOUNTER — Encounter (HOSPITAL_COMMUNITY): Payer: Self-pay | Admitting: Emergency Medicine

## 2012-04-22 DIAGNOSIS — I10 Essential (primary) hypertension: Secondary | ICD-10-CM | POA: Insufficient documentation

## 2012-04-22 DIAGNOSIS — F411 Generalized anxiety disorder: Secondary | ICD-10-CM | POA: Insufficient documentation

## 2012-04-22 DIAGNOSIS — IMO0001 Reserved for inherently not codable concepts without codable children: Secondary | ICD-10-CM | POA: Insufficient documentation

## 2012-04-22 DIAGNOSIS — M549 Dorsalgia, unspecified: Secondary | ICD-10-CM

## 2012-04-22 DIAGNOSIS — Z794 Long term (current) use of insulin: Secondary | ICD-10-CM | POA: Insufficient documentation

## 2012-04-22 DIAGNOSIS — M47817 Spondylosis without myelopathy or radiculopathy, lumbosacral region: Secondary | ICD-10-CM | POA: Insufficient documentation

## 2012-04-22 DIAGNOSIS — F17200 Nicotine dependence, unspecified, uncomplicated: Secondary | ICD-10-CM | POA: Insufficient documentation

## 2012-04-22 DIAGNOSIS — M51379 Other intervertebral disc degeneration, lumbosacral region without mention of lumbar back pain or lower extremity pain: Secondary | ICD-10-CM | POA: Insufficient documentation

## 2012-04-22 DIAGNOSIS — F319 Bipolar disorder, unspecified: Secondary | ICD-10-CM | POA: Insufficient documentation

## 2012-04-22 DIAGNOSIS — E119 Type 2 diabetes mellitus without complications: Secondary | ICD-10-CM | POA: Insufficient documentation

## 2012-04-22 DIAGNOSIS — M5137 Other intervertebral disc degeneration, lumbosacral region: Secondary | ICD-10-CM | POA: Insufficient documentation

## 2012-04-22 MED ORDER — MORPHINE SULFATE 4 MG/ML IJ SOLN
8.0000 mg | Freq: Once | INTRAMUSCULAR | Status: AC
  Administered 2012-04-22: 8 mg via INTRAMUSCULAR
  Filled 2012-04-22: qty 2

## 2012-04-22 MED ORDER — IBUPROFEN 800 MG PO TABS
800.0000 mg | ORAL_TABLET | Freq: Once | ORAL | Status: AC
  Administered 2012-04-22: 800 mg via ORAL
  Filled 2012-04-22: qty 1

## 2012-04-22 MED ORDER — PROMETHAZINE HCL 12.5 MG PO TABS
12.5000 mg | ORAL_TABLET | Freq: Once | ORAL | Status: AC
  Administered 2012-04-22: 12.5 mg via ORAL
  Filled 2012-04-22: qty 1

## 2012-04-22 MED ORDER — DIAZEPAM 5 MG PO TABS
5.0000 mg | ORAL_TABLET | Freq: Once | ORAL | Status: AC
  Administered 2012-04-22: 5 mg via ORAL
  Filled 2012-04-22: qty 1

## 2012-04-22 NOTE — ED Notes (Signed)
Pt states she woke this morning & her back was hurting bad. States she is unable to take the pain.

## 2012-04-22 NOTE — ED Provider Notes (Signed)
History     CSN: [id number]  Arrival date & time 04/22/12  2137   None     Chief Complaint  Patient presents with  . Back Pain  . Arthritis    (Consider location/radiation/quality/duration/timing/severity/associated sxs/prior treatment) HPI Comments: Pt states she is afraid of being paralyzed because of her back. She states she is "going through a lot".   Patient is a 46 y.o. female presenting with back pain and arthritis. The history is provided by the patient.  Back Pain  This is a chronic problem. The current episode started more than 1 week ago. The problem occurs daily. The problem has been gradually worsening. Associated with: arthritis and DDD. The pain is present in the lumbar spine. The quality of the pain is described as aching. The pain is severe. The symptoms are aggravated by certain positions. The pain is worse during the day. Pertinent negatives include no chest pain, no abdominal pain, no bowel incontinence, no perianal numbness, no bladder incontinence and no dysuria. Associated symptoms comments: nausea. She has tried nothing for the symptoms. The treatment provided no relief.  Arthritis Associated symptoms include coughing. Pertinent negatives include no abdominal pain, arthralgias, chest pain or neck pain.    Past Medical History  Diagnosis Date  . Bipolar 1 disorder   . Diabetes mellitus   . Hypertension   . Fibromyalgia   . Anxiety   . Panic attack     Past Surgical History  Procedure Date  . Mandible surgery   . Cholecystectomy     Family History  Problem Relation Age of Onset  . Heart failure Mother   . Heart failure Other     History  Substance Use Topics  . Smoking status: Current Every Day Smoker -- 1.0 packs/day for 30 years    Types: Cigarettes  . Smokeless tobacco: Not on file  . Alcohol Use: No    OB History    Grav Para Term Preterm Abortions TAB SAB Ect Mult Living   4    2  2          Review of Systems  Constitutional:  Negative for activity change.       All ROS Neg except as noted in HPI  HENT: Negative for nosebleeds and neck pain.   Eyes: Negative for photophobia and discharge.  Respiratory: Positive for cough. Negative for shortness of breath and wheezing.   Cardiovascular: Negative for chest pain and palpitations.  Gastrointestinal: Negative for abdominal pain, blood in stool and bowel incontinence.  Genitourinary: Negative for bladder incontinence, dysuria, frequency and hematuria.  Musculoskeletal: Positive for back pain and arthritis. Negative for arthralgias.  Skin: Negative.   Neurological: Negative for dizziness, seizures and speech difficulty.  Psychiatric/Behavioral: Negative for hallucinations and confusion. The patient is nervous/anxious.     Allergies  Abilify; Aripiprazole; Cogentin; Haldol; and Procaine hcl  Home Medications   Current Outpatient Rx  Name Route Sig Dispense Refill  . ALPRAZOLAM 1 MG PO TABS Oral Take 1 mg by mouth 4 (four) times daily as needed. for anxiety.    . AMOXICILLIN 500 MG PO CAPS Oral Take 1 capsule (500 mg total) by mouth 3 (three) times daily. 21 capsule 0  . INSULIN GLARGINE 100 UNIT/ML [place] SOLN Subcutaneous Inject 25 Units into the skin at bedtime. For diabetes. 10 mL   . METFORMIN HCL 500 MG PO TABS Oral Take 1 tablet (500 mg total) by mouth 2 (two) times daily with a meal. For diabetes.  BP 128/73  Pulse 93  Temp 98 F (36.7 C) (Oral)  Resp 20  Ht 5\' 1"  (1.549 m)  Wt 200 lb (90.719 kg)  BMI 37.79 kg/m2  SpO2 99%  Physical Exam  Nursing note and vitals reviewed. Constitutional: She is oriented to person, place, and time. She appears well-developed and well-nourished.  Non-toxic appearance.  HENT:  Head: Normocephalic.  Right Ear: Tympanic membrane and external ear normal.  Left Ear: Tympanic membrane and external ear normal.  Eyes: EOM and lids are normal. Pupils are equal, round, and reactive to light.  Neck: Normal range of motion.  Neck supple. Carotid bruit is not present.  Cardiovascular: Normal rate, regular rhythm, normal heart sounds, intact distal pulses and normal pulses.   Pulmonary/Chest: Breath sounds normal. No respiratory distress.  Abdominal: Soft. Bowel sounds are normal. There is no tenderness. There is no guarding.  Musculoskeletal: Normal range of motion.       Lumbar area pain to palpation in change of position. Paraspinal tenderness noted to palpation in the lumbar region.  Lymphadenopathy:       Head (right side): No submandibular adenopathy present.       Head (left side): No submandibular adenopathy present.    She has no cervical adenopathy.  Neurological: She is alert and oriented to person, place, and time. She has normal strength. No cranial nerve deficit or sensory deficit.  Skin: Skin is warm and dry.  Psychiatric: Her speech is normal. Her mood appears anxious.    ED Course  Procedures (including critical care time)  Labs Reviewed - No data to display No results found.   No diagnosis found.    MDM  I have reviewed nursing notes, vital signs, and all appropriate lab and imaging results for this patient. Patient presented to the emergency department by EMS with complaint of lower back pain. She has a history of chronic lower back pain, fibromyalgia. The patient initially mentioned to EMS that she may be having a" nervous breakdown" because of various problems are going on in her home domestically. She now says that she is not having a nervous breakdown, but was tremoring because of the pain. She feels much better after having received pain medication. She specifically denies suicidal or homicidal ideation. She states that the behavioral health team (L.) nose her condition and is taken care of her in the past. When asked if she had a safe place to stay tonight the patient states that she has a safe place and that has been covered.  The patient was ambulatory in the [place],  pain improved.  The plan at this time is for the patient be treated with Norco every 4 hours as needed for pain #15 tablets. Patient is to follow with her primary physician.      Kathie Dike, [place] 04/23/12 [phone]

## 2012-04-22 NOTE — ED Notes (Signed)
Patient presents to ER via EMS for lower back pain.  Per EMS, patient was seen last week and diagnosed with arthritis. Patient also told EMS that she is going through a lot and said that she is going to have a nervous breakdown.

## 2012-04-23 MED ORDER — HYDROCODONE-ACETAMINOPHEN 5-325 MG PO TABS: 1.0000 | ORAL_TABLET | ORAL | Status: DC | PRN

## 2012-04-23 MED ORDER — ONDANSETRON HCL 4 MG PO TABS: 4.0000 mg | ORAL_TABLET | Freq: Four times a day (QID) | ORAL | Status: DC

## 2012-04-23 NOTE — ED Provider Notes (Signed)
Medical screening examination/treatment/procedure(s) were performed by non-physician practitioner and as supervising physician I was immediately available for consultation/collaboration.   Joya Gaskins, MD 04/23/12 [phone]

## 2012-04-23 NOTE — ED Notes (Signed)
Pt alert & oriented x4, stable gait. Patient given discharge instructions, paperwork & prescription(s). Patient  instructed to stop at the registration desk to finish any additional paperwork. Patient verbalized understanding. Pt left department w/ no further questions. 

## 2012-05-16 ENCOUNTER — Emergency Department (HOSPITAL_COMMUNITY)
Admission: EM | Admit: 2012-05-16 | Discharge: 2012-05-16 | Disposition: A | Discharge status: Home / Self Care | Payer: Medicaid Other | Attending: Emergency Medicine | Admitting: Emergency Medicine

## 2012-05-16 ENCOUNTER — Encounter (HOSPITAL_COMMUNITY): Payer: Self-pay | Admitting: Emergency Medicine

## 2012-05-16 DIAGNOSIS — I10 Essential (primary) hypertension: Secondary | ICD-10-CM | POA: Insufficient documentation

## 2012-05-16 DIAGNOSIS — Z794 Long term (current) use of insulin: Secondary | ICD-10-CM | POA: Insufficient documentation

## 2012-05-16 DIAGNOSIS — M549 Dorsalgia, unspecified: Secondary | ICD-10-CM

## 2012-05-16 DIAGNOSIS — Z79899 Other long term (current) drug therapy: Secondary | ICD-10-CM | POA: Insufficient documentation

## 2012-05-16 DIAGNOSIS — E119 Type 2 diabetes mellitus without complications: Secondary | ICD-10-CM | POA: Insufficient documentation

## 2012-05-16 DIAGNOSIS — F411 Generalized anxiety disorder: Secondary | ICD-10-CM | POA: Insufficient documentation

## 2012-05-16 DIAGNOSIS — F319 Bipolar disorder, unspecified: Secondary | ICD-10-CM | POA: Insufficient documentation

## 2012-05-16 DIAGNOSIS — G8929 Other chronic pain: Secondary | ICD-10-CM | POA: Insufficient documentation

## 2012-05-16 DIAGNOSIS — IMO0001 Reserved for inherently not codable concepts without codable children: Secondary | ICD-10-CM | POA: Insufficient documentation

## 2012-05-16 DIAGNOSIS — F17200 Nicotine dependence, unspecified, uncomplicated: Secondary | ICD-10-CM | POA: Insufficient documentation

## 2012-05-16 MED ORDER — ONDANSETRON HCL 4 MG PO TABS
4.0000 mg | ORAL_TABLET | Freq: Once | ORAL | Status: AC
  Administered 2012-05-16: 4 mg via ORAL
  Filled 2012-05-16: qty 1

## 2012-05-16 MED ORDER — HYDROCODONE-ACETAMINOPHEN 5-325 MG PO TABS: 1.0000 | ORAL_TABLET | ORAL | Status: DC | PRN

## 2012-05-16 MED ORDER — BACLOFEN 10 MG PO TABS: 10.0000 mg | ORAL_TABLET | Freq: Three times a day (TID) | ORAL | Status: DC

## 2012-05-16 MED ORDER — HYDROCODONE-ACETAMINOPHEN 5-325 MG PO TABS
2.0000 | ORAL_TABLET | Freq: Once | ORAL | Status: AC
  Administered 2012-05-16: 2 via ORAL
  Filled 2012-05-16: qty 2

## 2012-05-16 MED ORDER — DIAZEPAM 5 MG PO TABS
5.0000 mg | ORAL_TABLET | Freq: Once | ORAL | Status: AC
  Administered 2012-05-16: 5 mg via ORAL
  Filled 2012-05-16: qty 1

## 2012-05-16 NOTE — ED Notes (Signed)
Pt c/o lower back pain since this am.

## 2012-05-16 NOTE — ED Provider Notes (Signed)
History     CSN: [id number]  Arrival date & time 05/16/12  1128   First MD Initiated Contact with Patient 05/16/12 1139      Chief Complaint  Patient presents with  . Back Pain    (Consider location/radiation/quality/duration/timing/severity/associated sxs/prior treatment) Patient is a 46 y.o. female presenting with back pain. The history is provided by the patient.  Back Pain  This is a chronic problem. The problem occurs daily. The problem has been gradually worsening. The pain is associated with no known injury. The pain is present in the lumbar spine. The quality of the pain is described as aching. The pain is moderate. The symptoms are aggravated by certain positions. The pain is the same all the time. Pertinent negatives include no chest pain, no fever, no abdominal pain, no bowel incontinence, no perianal numbness, no bladder incontinence and no dysuria.    Past Medical History  Diagnosis Date  . Bipolar 1 disorder   . Diabetes mellitus   . Hypertension   . Fibromyalgia   . Anxiety   . Panic attack     Past Surgical History  Procedure Date  . Mandible surgery   . Cholecystectomy     Family History  Problem Relation Age of Onset  . Heart failure Mother   . Heart failure Other     History  Substance Use Topics  . Smoking status: Current Every Day Smoker -- 1.0 packs/day for 30 years    Types: Cigarettes  . Smokeless tobacco: Not on file  . Alcohol Use: No    OB History    Grav Para Term Preterm Abortions TAB SAB Ect Mult Living   4    2  2          Review of Systems  Constitutional: Negative for fever and activity change.       All ROS Neg except as noted in HPI  HENT: Negative for nosebleeds and neck pain.   Eyes: Negative for photophobia and discharge.  Respiratory: Negative for cough, shortness of breath and wheezing.   Cardiovascular: Negative for chest pain and palpitations.  Gastrointestinal: Negative for abdominal pain, blood in stool and  bowel incontinence.  Genitourinary: Negative for bladder incontinence, dysuria, frequency and hematuria.  Musculoskeletal: Positive for back pain. Negative for arthralgias.  Skin: Negative.   Neurological: Negative for dizziness, seizures and speech difficulty.  Psychiatric/Behavioral: Negative for hallucinations and confusion.    Allergies  Aripiprazole; Cogentin; Haldol; and Procaine hcl  Home Medications   Current Outpatient Rx  Name Route Sig Dispense Refill  . ALPRAZOLAM 1 MG PO TABS Oral Take 1 mg by mouth 4 (four) times daily as needed. for anxiety.    . INSULIN GLARGINE 100 UNIT/ML  SOLN Subcutaneous Inject 25 Units into the skin at bedtime. For diabetes. 10 mL   . METFORMIN HCL 500 MG PO TABS Oral Take 1 tablet (500 mg total) by mouth 2 (two) times daily with a meal. For diabetes.      BP 142/74  Pulse 62  Temp 97.4 F (36.3 C) (Oral)  Resp 16  Ht 5\' 1"  (1.549 m)  Wt 208 lb (94.348 kg)  BMI 39.30 kg/m2  SpO2 100%  Physical Exam  Nursing note and vitals reviewed. Constitutional: She is oriented to person, place, and time. She appears well-developed and well-nourished.  Non-toxic appearance.  HENT:  Head: Normocephalic.  Right Ear: Tympanic membrane and external ear normal.  Left Ear: Tympanic membrane and external ear normal.  Eyes:  EOM and lids are normal. Pupils are equal, round, and reactive to light.  Neck: Normal range of motion. Neck supple. Carotid bruit is not present.  Cardiovascular: Normal rate, regular rhythm, normal heart sounds, intact distal pulses and normal pulses.   Pulmonary/Chest: Breath sounds normal. No respiratory distress.  Abdominal: Soft. Bowel sounds are normal. There is no tenderness. There is no guarding.  Musculoskeletal: Normal range of motion.       Lumbar area pain to palpation and movement. Paraspinal lumbar area tightness and tenderness present. No step off. No hot areas.  Lymphadenopathy:       Head (right side): No  submandibular adenopathy present.       Head (left side): No submandibular adenopathy present.    She has no cervical adenopathy.  Neurological: She is alert and oriented to person, place, and time. She has normal strength. No cranial nerve deficit or sensory deficit. She exhibits normal muscle tone. Coordination normal.  Skin: Skin is warm and dry.  Psychiatric: She has a normal mood and affect. Her speech is normal.    ED Course  Procedures (including critical care time)  Labs Reviewed - No data to display No results found.   No diagnosis found.    MDM  I have reviewed nursing notes, vital signs, and all appropriate lab and imaging results for this patient. Pt presents to ED with acute episode of her chronic back pain. No acute deficits at this time. Rx for baclofen, norco given to the patient. Pt is attempting to obtain a primary MD for follow up.       Kathie Dike, [place] 05/21/12 [phone]

## 2012-05-21 ENCOUNTER — Emergency Department (HOSPITAL_COMMUNITY)
Admission: EM | Admit: 2012-05-21 | Discharge: 2012-05-21 | Disposition: A | Discharge status: Home / Self Care | Payer: Medicaid Other | Attending: Emergency Medicine | Admitting: Emergency Medicine

## 2012-05-21 ENCOUNTER — Emergency Department (HOSPITAL_COMMUNITY): Payer: Medicaid Other

## 2012-05-21 ENCOUNTER — Encounter (HOSPITAL_COMMUNITY): Payer: Self-pay | Admitting: *Deleted

## 2012-05-21 DIAGNOSIS — F319 Bipolar disorder, unspecified: Secondary | ICD-10-CM | POA: Insufficient documentation

## 2012-05-21 DIAGNOSIS — M545 Low back pain, unspecified: Secondary | ICD-10-CM | POA: Insufficient documentation

## 2012-05-21 DIAGNOSIS — Z79899 Other long term (current) drug therapy: Secondary | ICD-10-CM | POA: Insufficient documentation

## 2012-05-21 DIAGNOSIS — IMO0001 Reserved for inherently not codable concepts without codable children: Secondary | ICD-10-CM | POA: Insufficient documentation

## 2012-05-21 DIAGNOSIS — E119 Type 2 diabetes mellitus without complications: Secondary | ICD-10-CM | POA: Insufficient documentation

## 2012-05-21 DIAGNOSIS — Z9089 Acquired absence of other organs: Secondary | ICD-10-CM | POA: Insufficient documentation

## 2012-05-21 DIAGNOSIS — Z8659 Personal history of other mental and behavioral disorders: Secondary | ICD-10-CM | POA: Insufficient documentation

## 2012-05-21 DIAGNOSIS — G8929 Other chronic pain: Secondary | ICD-10-CM | POA: Insufficient documentation

## 2012-05-21 DIAGNOSIS — F17200 Nicotine dependence, unspecified, uncomplicated: Secondary | ICD-10-CM | POA: Insufficient documentation

## 2012-05-21 DIAGNOSIS — M25559 Pain in unspecified hip: Secondary | ICD-10-CM | POA: Insufficient documentation

## 2012-05-21 DIAGNOSIS — F411 Generalized anxiety disorder: Secondary | ICD-10-CM | POA: Insufficient documentation

## 2012-05-21 DIAGNOSIS — C9200 Acute myeloblastic leukemia, not having achieved remission: Secondary | ICD-10-CM | POA: Insufficient documentation

## 2012-05-21 DIAGNOSIS — Z794 Long term (current) use of insulin: Secondary | ICD-10-CM | POA: Insufficient documentation

## 2012-05-21 DIAGNOSIS — Z9889 Other specified postprocedural states: Secondary | ICD-10-CM | POA: Insufficient documentation

## 2012-05-21 DIAGNOSIS — M549 Dorsalgia, unspecified: Secondary | ICD-10-CM

## 2012-05-21 LAB — URINALYSIS, ROUTINE W REFLEX MICROSCOPIC
Glucose, UA: NEGATIVE mg/dL
Ketones, ur: NEGATIVE mg/dL
Leukocytes, UA: NEGATIVE
Nitrite: NEGATIVE
Protein, ur: NEGATIVE mg/dL

## 2012-05-21 MED ORDER — NAPROXEN 500 MG PO TABS: 500.0000 mg | ORAL_TABLET | Freq: Two times a day (BID) | ORAL | Status: DC

## 2012-05-21 MED ORDER — DIAZEPAM 5 MG PO TABS
5.0000 mg | ORAL_TABLET | Freq: Once | ORAL | Status: AC
  Administered 2012-05-21: 5 mg via ORAL
  Filled 2012-05-21: qty 1

## 2012-05-21 MED ORDER — OXYCODONE-ACETAMINOPHEN 5-325 MG PO TABS: 2.0000 | ORAL_TABLET | ORAL | Status: DC | PRN

## 2012-05-21 MED ORDER — HYDROMORPHONE HCL PF 2 MG/ML IJ SOLN
2.0000 mg | Freq: Once | INTRAMUSCULAR | Status: AC
  Administered 2012-05-21: 2 mg via INTRAMUSCULAR
  Filled 2012-05-21: qty 1

## 2012-05-21 MED ORDER — KETOROLAC TROMETHAMINE 60 MG/2ML IM SOLN
60.0000 mg | Freq: Once | INTRAMUSCULAR | Status: AC
  Administered 2012-05-21: 60 mg via INTRAMUSCULAR
  Filled 2012-05-21: qty 2

## 2012-05-21 NOTE — ED Provider Notes (Signed)
History     CSN: [id number]  Arrival date & time 05/21/12  7829   First MD Initiated Contact with Patient 05/21/12 [phone]      Chief Complaint  Patient presents with  . Back Pain  . Hip Pain    (Consider location/radiation/quality/duration/timing/severity/associated sxs/prior treatment) HPI Comments: Patient presents with exacerbation of her chronic low back pain it radiates into her left leg. This is similar to her pain and she's had for several years. He was seen recently on October 20 and given Percocet which is now gone. She denies any bowel or bladder incontinence, fevers or vomiting. She denies any focal weakness, numbness or tingling. Denies any recent lifting injury or trauma. There is no change in chronic location or character of her pain.  The history is provided by the patient and the EMS personnel.    Past Medical History  Diagnosis Date  . Bipolar 1 disorder   . Diabetes mellitus   . Fibromyalgia   . Anxiety   . Panic attack     Past Surgical History  Procedure Date  . Mandible surgery   . Cholecystectomy     Family History  Problem Relation Age of Onset  . Heart failure Mother   . Heart failure Other     History  Substance Use Topics  . Smoking status: Current Every Day Smoker -- 1.0 packs/day for 30 years    Types: Cigarettes  . Smokeless tobacco: Not on file  . Alcohol Use: No    OB History    Grav Para Term Preterm Abortions TAB SAB Ect Mult Living   4    2  2          Review of Systems  Constitutional: Negative for fever, activity change and appetite change.  HENT: Negative for congestion and rhinorrhea.   Respiratory: Negative for cough, chest tightness and shortness of breath.   Cardiovascular: Negative for chest pain.  Gastrointestinal: Negative for nausea, vomiting and abdominal pain.  Genitourinary: Negative for dysuria, hematuria, vaginal bleeding and vaginal discharge.  Musculoskeletal: Positive for back pain.  Skin: Negative for  rash.  Neurological: Negative for dizziness, weakness and numbness.    Allergies  Aripiprazole; Cogentin; Haldol; and Procaine hcl  Home Medications   Current Outpatient Rx  Name Route Sig Dispense Refill  . ALPRAZOLAM 1 MG PO TABS Oral Take 1 mg by mouth 4 (four) times daily as needed. for anxiety.    Marland Kitchen BACLOFEN 10 MG PO TABS Oral Take 1 tablet (10 mg total) by mouth 3 (three) times daily. 21 each 0  . HYDROCODONE-ACETAMINOPHEN 5-325 MG PO TABS Oral Take 1 tablet by mouth every 4 (four) hours as needed for pain. 15 tablet 0  . INSULIN GLARGINE 100 UNIT/ML [place] SOLN Subcutaneous Inject 25 Units into the skin at bedtime. For diabetes. 10 mL   . METFORMIN HCL 500 MG PO TABS Oral Take 1 tablet (500 mg total) by mouth 2 (two) times daily with a meal. For diabetes.    Marland Kitchen NAPROXEN 500 MG PO TABS Oral Take 1 tablet (500 mg total) by mouth 2 (two) times daily. 30 tablet 0  . OXYCODONE-ACETAMINOPHEN 5-325 MG PO TABS Oral Take 2 tablets by mouth every 4 (four) hours as needed for pain. 15 tablet 0    BP 138/79  Pulse 96  Temp 97.3 F (36.3 C) (Oral)  Resp 20  Ht 5\' 1"  (1.549 m)  Wt 210 lb (95.255 kg)  BMI 39.68 kg/m2  SpO2 98%  Physical Exam  Constitutional: She is oriented to person, place, and time. She appears well-developed and well-nourished. No distress.       anxious  HENT:  Head: Normocephalic and atraumatic.  Mouth/Throat: No oropharyngeal exudate.  Eyes: Conjunctivae normal are normal. Pupils are equal, round, and reactive to light.  Neck: Normal range of motion. Neck supple.  Cardiovascular: Normal rate, regular rhythm and normal heart sounds.   No murmur heard. Pulmonary/Chest: Breath sounds normal. No respiratory distress.  Abdominal: Soft. There is no tenderness. There is no rebound and no guarding.  Musculoskeletal: Normal range of motion. She exhibits tenderness. She exhibits no edema.       L paraspinal and SI joint tenderness. No midline tenderness  Neurological: She  is alert and oriented to person, place, and time. No cranial nerve deficit.       5/5 strength in bilateral lower extremities. Ankle plantar and dorsiflexion intact. Great toe extension intact bilaterally. +2 DP and PT pulses. Unable to ellicit patellar reflexes bilaterally. Normal gait.   Skin: Skin is warm.    ED Course  Procedures (including critical care time)  Labs Reviewed  URINALYSIS, ROUTINE W REFLEX MICROSCOPIC - Abnormal; Notable for the following:    Specific Gravity, Urine <1.005 (*)     All other components within normal limits  PREGNANCY, URINE   No results found.   1. Back pain       MDM  Acute on chronic back pain without significant change or new neuro deficit. Abdomen obese, soft, nontender.   Equal strength bilaterally, great toe dorsiflexion intact. Unable to ellicit reflexes bilaterally.  Patient refuses Xrays.  Ambulatory without assistance. Stable for followup with PCP and neurology.  Doubt cord compression or cauda equina.   [name], MD 05/21/12 [phone]

## 2012-05-21 NOTE — ED Provider Notes (Signed)
Medical screening examination/treatment/procedure(s) were performed by non-physician practitioner and as supervising physician I was immediately available for consultation/collaboration.   Joya Gaskins, MD 05/21/12 [phone]

## 2012-05-21 NOTE — ED Notes (Signed)
Patient refused xrays. rancour notified

## 2012-05-21 NOTE — ED Notes (Signed)
Patient yelling at staff . Security at side

## 2012-05-24 ENCOUNTER — Emergency Department (HOSPITAL_COMMUNITY)
Admission: EM | Admit: 2012-05-24 | Discharge: 2012-05-24 | Disposition: A | Discharge status: Home / Self Care | Payer: MEDICAID | Attending: Emergency Medicine | Admitting: Emergency Medicine

## 2012-05-24 ENCOUNTER — Encounter (HOSPITAL_COMMUNITY): Payer: Self-pay | Admitting: *Deleted

## 2012-05-24 DIAGNOSIS — Z765 Malingerer [conscious simulation]: Secondary | ICD-10-CM | POA: Insufficient documentation

## 2012-05-24 DIAGNOSIS — IMO0001 Reserved for inherently not codable concepts without codable children: Secondary | ICD-10-CM | POA: Insufficient documentation

## 2012-05-24 DIAGNOSIS — Z79899 Other long term (current) drug therapy: Secondary | ICD-10-CM | POA: Insufficient documentation

## 2012-05-24 DIAGNOSIS — F1910 Other psychoactive substance abuse, uncomplicated: Secondary | ICD-10-CM | POA: Insufficient documentation

## 2012-05-24 DIAGNOSIS — F17200 Nicotine dependence, unspecified, uncomplicated: Secondary | ICD-10-CM | POA: Insufficient documentation

## 2012-05-24 DIAGNOSIS — F319 Bipolar disorder, unspecified: Secondary | ICD-10-CM | POA: Insufficient documentation

## 2012-05-24 DIAGNOSIS — Z794 Long term (current) use of insulin: Secondary | ICD-10-CM | POA: Insufficient documentation

## 2012-05-24 DIAGNOSIS — E119 Type 2 diabetes mellitus without complications: Secondary | ICD-10-CM | POA: Insufficient documentation

## 2012-05-24 DIAGNOSIS — F411 Generalized anxiety disorder: Secondary | ICD-10-CM | POA: Insufficient documentation

## 2012-05-24 MED ORDER — OXYCODONE-ACETAMINOPHEN 5-325 MG PO TABS
1.0000 | ORAL_TABLET | Freq: Once | ORAL | Status: AC
  Administered 2012-05-24: 1 via ORAL
  Filled 2012-05-24: qty 1

## 2012-05-24 NOTE — ED Notes (Signed)
Low back pain for years and has gotten worse.radiates down lt leg.

## 2012-05-24 NOTE — ED Provider Notes (Signed)
Medical screening examination/treatment/procedure(s) were performed by non-physician practitioner and as supervising physician I was immediately available for consultation/collaboration.   Kess Mcilwain W. Jerral Mccauley, MD 05/24/12 2002 

## 2012-05-24 NOTE — ED Notes (Signed)
Pt seen by EDPa for initial assessment. 
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
## 2012-05-26 ENCOUNTER — Encounter (HOSPITAL_COMMUNITY): Payer: Self-pay | Admitting: *Deleted

## 2012-05-26 DIAGNOSIS — F319 Bipolar disorder, unspecified: Secondary | ICD-10-CM | POA: Insufficient documentation

## 2012-05-26 DIAGNOSIS — Z91199 Patient's noncompliance with other medical treatment and regimen due to unspecified reason: Secondary | ICD-10-CM | POA: Insufficient documentation

## 2012-05-26 DIAGNOSIS — Z8739 Personal history of other diseases of the musculoskeletal system and connective tissue: Secondary | ICD-10-CM | POA: Insufficient documentation

## 2012-05-26 DIAGNOSIS — R52 Pain, unspecified: Secondary | ICD-10-CM | POA: Insufficient documentation

## 2012-05-26 DIAGNOSIS — F410 Panic disorder [episodic paroxysmal anxiety] without agoraphobia: Secondary | ICD-10-CM | POA: Insufficient documentation

## 2012-05-26 DIAGNOSIS — M545 Low back pain, unspecified: Secondary | ICD-10-CM | POA: Insufficient documentation

## 2012-05-26 DIAGNOSIS — F411 Generalized anxiety disorder: Secondary | ICD-10-CM | POA: Insufficient documentation

## 2012-05-26 DIAGNOSIS — E119 Type 2 diabetes mellitus without complications: Secondary | ICD-10-CM | POA: Insufficient documentation

## 2012-05-26 DIAGNOSIS — IMO0001 Reserved for inherently not codable concepts without codable children: Secondary | ICD-10-CM | POA: Insufficient documentation

## 2012-05-26 DIAGNOSIS — G8929 Other chronic pain: Secondary | ICD-10-CM | POA: Insufficient documentation

## 2012-05-26 DIAGNOSIS — Z9119 Patient's noncompliance with other medical treatment and regimen: Secondary | ICD-10-CM | POA: Insufficient documentation

## 2012-05-26 DIAGNOSIS — F17200 Nicotine dependence, unspecified, uncomplicated: Secondary | ICD-10-CM | POA: Insufficient documentation

## 2012-05-26 DIAGNOSIS — Z794 Long term (current) use of insulin: Secondary | ICD-10-CM | POA: Insufficient documentation
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
## 2012-05-27 ENCOUNTER — Emergency Department (HOSPITAL_COMMUNITY)
Admission: EM | Admit: 2012-05-27 | Discharge: 2012-05-27 | Disposition: A | Discharge status: Home / Self Care | Payer: Medicaid Other | Attending: Emergency Medicine | Admitting: Emergency Medicine

## 2012-05-27 DIAGNOSIS — G8929 Other chronic pain: Secondary | ICD-10-CM

## 2012-05-27 MED ORDER — OXYCODONE-ACETAMINOPHEN 5-325 MG PO TABS
1.0000 | ORAL_TABLET | Freq: Once | ORAL | Status: AC
  Administered 2012-05-27: 1 via ORAL
  Filled 2012-05-27: qty 1
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
## 2012-05-27 NOTE — ED Notes (Signed)
Pt reporting pain in lower back, radiating into hips.  Also reporting pain from rotator cuff.  States that she has been referred to a neurologist, but has not seen him yet.  Reporting being out of medications.

## 2012-05-27 NOTE — ED Provider Notes (Signed)
History     CSN: [id number]  Arrival date & time 05/26/12  2243   First MD Initiated Contact with Patient 05/27/12 0040      Chief Complaint  Patient presents with  . Back Pain    (Consider location/radiation/quality/duration/timing/severity/associated sxs/prior treatment) HPI  [name] is a 46 y.o. female with a h/o bipolar disorder, chronic pain  who presents to the Emergency Department complaining of continued lower back pain, shoulder pain, and generalized body pain. She has been seen multiple times in the past for similar pain. Last seen yesterday. She has a PCP who will not provide her narcotics. She refuses to go to pain management. Pain is across the lower back.   PCP Dr. Janna Arch  Past Medical History  Diagnosis Date  . Bipolar 1 disorder   . Diabetes mellitus   . Fibromyalgia   . Anxiety   . Panic attack     Past Surgical History  Procedure Date  . Mandible surgery   . Cholecystectomy     Family History  Problem Relation Age of Onset  . Heart failure Mother   . Heart failure Other     History  Substance Use Topics  . Smoking status: Current Every Day Smoker -- 1.0 packs/day for 30 years    Types: Cigarettes  . Smokeless tobacco: Not on file  . Alcohol Use: No    OB History    Grav Para Term Preterm Abortions TAB SAB Ect Mult Living   4    2  2          Review of Systems  Constitutional: Negative for fever.       10 Systems reviewed and are negative for acute change except as noted in the HPI.  HENT: Negative for congestion.   Eyes: Negative for discharge and redness.  Respiratory: Negative for cough and shortness of breath.   Cardiovascular: Negative for chest pain.  Gastrointestinal: Negative for vomiting and abdominal pain.  Musculoskeletal: Negative for back pain.       Back pain, generalized pain  Skin: Negative for rash.  Neurological: Negative for syncope, numbness and headaches.  Psychiatric/Behavioral:       No behavior  change.    Allergies  Aripiprazole; Cogentin; Haldol; and Procaine hcl  Home Medications   Current Outpatient Rx  Name Route Sig Dispense Refill  . IBUPROFEN 200 MG PO TABS Oral Take 600 mg by mouth every 6 (six) hours as needed. Pain    . INSULIN GLARGINE 100 UNIT/ML [place] SOLN Subcutaneous Inject 25 Units into the skin at bedtime. For diabetes. 10 mL     BP 115/54  Pulse 75  Temp 98.1 F (36.7 C) (Oral)  Resp 20  Ht 5\' 1"  (1.549 m)  Wt 210 lb (95.255 kg)  BMI 39.68 kg/m2  SpO2 98%  LMP 09/08/2011  Physical Exam  Nursing note and vitals reviewed. Constitutional: She appears well-developed and well-nourished.       Awake, alert, nontoxic appearance.  HENT:  Head: Atraumatic.  Eyes: Right eye exhibits no discharge. Left eye exhibits no discharge.  Neck: Neck supple.  Cardiovascular: Normal heart sounds.   Pulmonary/Chest: Effort normal and breath sounds normal. She exhibits no tenderness.  Abdominal: Soft. There is no tenderness. There is no rebound.  Genitourinary:       No cva tenderness to percussion  Musculoskeletal: She exhibits no tenderness.       Baseline ROM, no obvious new focal weakness.No spinal tendereness to percussion. Lumbar  paraspinal tenderness bilaterally to palpation.  Neurological:       Mental status and motor strength appears baseline for patient and situation.  Skin: No rash noted.  Psychiatric: She has a normal mood and affect.    ED Course  Procedures (including critical care time)    1. Chronic pain       MDM  Patient with chronic pain and poor medical compliance here with back pain and generalized body pain. I gave her a single percocet and would not give her a Rx. Encouraged patient to follow up with PCP.Pt stable in ED with no significant deterioration in condition.The patient appears reasonably screened and/or stabilized for discharge and I doubt any other medical condition or other [facility] requiring further screening, evaluation, or  treatment in the ED at this time prior to discharge.  MDM Reviewed: nursing note, vitals and previous chart           Nicoletta Dress. Colon Branch, MD 05/27/12 [phone]

## 2012-06-07 ENCOUNTER — Emergency Department (HOSPITAL_COMMUNITY)
Admission: EM | Admit: 2012-06-07 | Discharge: 2012-06-07 | Disposition: A | Discharge status: Home / Self Care | Payer: Medicaid Other | Attending: Emergency Medicine | Admitting: Emergency Medicine

## 2012-06-07 ENCOUNTER — Encounter (HOSPITAL_COMMUNITY): Payer: Self-pay | Admitting: Emergency Medicine

## 2012-06-07 ENCOUNTER — Emergency Department (HOSPITAL_COMMUNITY): Payer: Medicaid Other

## 2012-06-07 DIAGNOSIS — F319 Bipolar disorder, unspecified: Secondary | ICD-10-CM | POA: Insufficient documentation

## 2012-06-07 DIAGNOSIS — F17200 Nicotine dependence, unspecified, uncomplicated: Secondary | ICD-10-CM | POA: Insufficient documentation

## 2012-06-07 DIAGNOSIS — F410 Panic disorder [episodic paroxysmal anxiety] without agoraphobia: Secondary | ICD-10-CM | POA: Insufficient documentation

## 2012-06-07 DIAGNOSIS — R079 Chest pain, unspecified: Secondary | ICD-10-CM | POA: Insufficient documentation

## 2012-06-07 DIAGNOSIS — E119 Type 2 diabetes mellitus without complications: Secondary | ICD-10-CM | POA: Insufficient documentation

## 2012-06-07 DIAGNOSIS — IMO0001 Reserved for inherently not codable concepts without codable children: Secondary | ICD-10-CM | POA: Insufficient documentation

## 2012-06-07 DIAGNOSIS — F411 Generalized anxiety disorder: Secondary | ICD-10-CM | POA: Insufficient documentation

## 2012-06-07 MED ORDER — IBUPROFEN 600 MG PO TABS: 600.0000 mg | ORAL_TABLET | Freq: Three times a day (TID) | ORAL | Status: DC | PRN

## 2012-06-07 MED ORDER — IBUPROFEN 400 MG PO TABS
600.0000 mg | ORAL_TABLET | Freq: Once | ORAL | Status: AC
  Administered 2012-06-07: 600 mg via ORAL
  Filled 2012-06-07: qty 2

## 2012-06-07 NOTE — ED Provider Notes (Signed)
History   This chart was scribed for Suzi Roots, MD by Charolett Bumpers, ER Scribe. The patient was seen in room APA02/APA02. Patient's care was started at 1422.   CSN: [id number]  Arrival date & time 06/07/12  1336   First MD Initiated Contact with Patient 06/07/12 1422      Chief Complaint  Patient presents with  . Chest Pain  . Panic Attack    The history is provided by the patient. No language interpreter was used.   [name] is a 46 y.o. female who presents to the Emergency Department complaining of constant, moderate right sided chest pain for the past 5 days. She reports associated muscle spasms that radiates up her neck. Pain constant, dull, moderate, worse w palpation, turning toros, change of position. She denies any cough, or leg swelling. No fever or chills.  She notes remote h/o hiatal hernia or reflux. She denies any chest wall injuries or recent strains. She denies any recent travel, or immobility. She denies any recent surgeries or trauma. No h/o DVT, PE or cardiac problems. No pleuritic pain. She reports a family h/o unspecified cardiac problems in her mother in older age.   Past Medical History  Diagnosis Date  . Bipolar 1 disorder   . Diabetes mellitus   . Fibromyalgia   . Anxiety   . Panic attack     Past Surgical History  Procedure Date  . Mandible surgery   . Cholecystectomy     Family History  Problem Relation Age of Onset  . Heart failure Mother   . Heart failure Other     History  Substance Use Topics  . Smoking status: Current Every Day Smoker -- 1.0 packs/day for 30 years    Types: Cigarettes  . Smokeless tobacco: Not on file  . Alcohol Use: No    OB History    Grav Para Term Preterm Abortions TAB SAB Ect Mult Living   4    2  2          Review of Systems  Constitutional: Negative for fever and chills.  HENT: Negative for neck pain.   Eyes: Negative for redness.  Respiratory: Negative for cough and shortness of  breath.   Cardiovascular: Positive for chest pain. Negative for leg swelling.  Gastrointestinal: Negative for abdominal pain.  Genitourinary: Negative for flank pain.  Musculoskeletal: Negative for back pain.  Skin: Negative for rash.  Neurological: Negative for headaches.  Hematological: Does not bruise/bleed easily.  Psychiatric/Behavioral: Negative for confusion.    Allergies  Aripiprazole; Cogentin; Haldol; and Procaine hcl  Home Medications   Current Outpatient Rx  Name  Route  Sig  Dispense  Refill  . IBUPROFEN 200 MG PO TABS   Oral   Take 600 mg by mouth every 6 (six) hours as needed. Pain         . INSULIN GLARGINE 100 UNIT/ML [place] SOLN   Subcutaneous   Inject 25 Units into the skin at bedtime. For diabetes.   10 mL        BP 120/69  Pulse 65  Temp 97.5 F (36.4 C) (Oral)  Resp 20  Ht 5\' 1"  (1.549 m)  Wt 210 lb (95.255 kg)  BMI 39.68 kg/m2  SpO2 99%  LMP 09/08/2011  Physical Exam  Nursing note and vitals reviewed. Constitutional: She appears well-developed and well-nourished. No distress.  HENT:  Mouth/Throat: Oropharynx is clear and moist.  Eyes: Conjunctivae normal are normal. No scleral icterus.  Neck: Neck supple. No tracheal deviation present.  Cardiovascular: Normal rate, regular rhythm, normal heart sounds and intact distal pulses.   Pulmonary/Chest: Effort normal and breath sounds normal. No respiratory distress. She exhibits tenderness.  Abdominal: Soft. Normal appearance and bowel sounds are normal. She exhibits no distension. There is no tenderness.  Musculoskeletal: She exhibits no edema and no tenderness.  Neurological: She is alert.  Skin: Skin is warm and dry. No rash noted.    ED Course  Procedures (including critical care time)  DIAGNOSTIC STUDIES: Oxygen Saturation is 99% on room air, normal by my interpretation.    COORDINATION OF CARE:  14:33-Discussed planned course of treatment with the patient, who is agreeable at this  time.     Results for orders placed during the hospital encounter of 06/07/12  TROPONIN I      Component Value Range   Troponin I <0.30  <0.30 ng/mL   Dg Chest 2 View  06/07/2012  *RADIOLOGY REPORT*  Clinical Data: Chest pain for 5 days with right arm pain.  CHEST - 2 VIEW  Comparison: 04/12/2012  Findings: Lateral view degraded by patient arm position.  Mild mid thoracic spondylosis. Midline trachea.  Normal heart size and mediastinal contours. No pleural effusion or pneumothorax.  No congestive failure.  Clear lungs.  IMPRESSION: No acute cardiopulmonary disease.   Original Report Authenticated By: Jeronimo Greaves, M.D.       MDM  I personally performed the services described in this documentation, which was scribed in my presence. The recorded information has been reviewed and is accurate.   Pt states drove self to ed. Motrin po.   Reviewed nursing notes and prior charts for additional history.       Date: 06/07/2012  Rate: 74  Rhythm: normal sinus rhythm  QRS Axis: normal  Intervals: normal  ST/T Wave abnormalities: normal  Conduction Disutrbances:none  Narrative Interpretation:   Old EKG Reviewed: unchanged  After constant symptoms x 4-5 days, trop negative, cxr neg acute, ecg neg acute and exam c/w musculoskeletal chest pain.   Pt appears stable for d/c.   Will give rx motrin. Close pcp follow up.     Suzi Roots, MD 06/07/12 [phone]

## 2012-06-07 NOTE — ED Notes (Signed)
Pt urinated on self, walked to bathroom and changed clothing, linens changed.

## 2012-06-07 NOTE — ED Notes (Signed)
Pt states has been without her medications now for a couple months. Pt states having chest pain with pain in right arm for 5 days now intermittently.

## 2012-06-07 NOTE — ED Notes (Signed)
MD at bedside. 

## 2012-06-13 ENCOUNTER — Encounter (HOSPITAL_COMMUNITY): Payer: Self-pay | Admitting: *Deleted

## 2012-06-13 ENCOUNTER — Emergency Department (HOSPITAL_COMMUNITY)
Admission: EM | Admit: 2012-06-13 | Discharge: 2012-06-13 | Disposition: A | Discharge status: Home / Self Care | Payer: Medicaid Other | Attending: Emergency Medicine | Admitting: Emergency Medicine

## 2012-06-13 ENCOUNTER — Emergency Department (HOSPITAL_COMMUNITY): Payer: Medicaid Other

## 2012-06-13 DIAGNOSIS — F17200 Nicotine dependence, unspecified, uncomplicated: Secondary | ICD-10-CM | POA: Insufficient documentation

## 2012-06-13 DIAGNOSIS — F309 Manic episode, unspecified: Secondary | ICD-10-CM | POA: Insufficient documentation

## 2012-06-13 DIAGNOSIS — R071 Chest pain on breathing: Secondary | ICD-10-CM | POA: Insufficient documentation

## 2012-06-13 DIAGNOSIS — Z79899 Other long term (current) drug therapy: Secondary | ICD-10-CM | POA: Insufficient documentation

## 2012-06-13 DIAGNOSIS — Z8739 Personal history of other diseases of the musculoskeletal system and connective tissue: Secondary | ICD-10-CM | POA: Insufficient documentation

## 2012-06-13 DIAGNOSIS — R0789 Other chest pain: Secondary | ICD-10-CM

## 2012-06-13 DIAGNOSIS — E119 Type 2 diabetes mellitus without complications: Secondary | ICD-10-CM | POA: Insufficient documentation

## 2012-06-13 DIAGNOSIS — R0602 Shortness of breath: Secondary | ICD-10-CM | POA: Insufficient documentation

## 2012-06-13 DIAGNOSIS — Z8659 Personal history of other mental and behavioral disorders: Secondary | ICD-10-CM | POA: Insufficient documentation

## 2012-06-13 LAB — CBC WITH DIFFERENTIAL/PLATELET
Eosinophils Absolute: 0.2 10*3/uL (ref 0.0–0.7)
Eosinophils Relative: 3 % (ref 0–5)
HCT: 37.4 % (ref 36.0–46.0)
Lymphs Abs: 2.6 10*3/uL (ref 0.7–4.0)
MCH: 33 pg (ref 26.0–34.0)
MCV: 93.5 fL (ref 78.0–100.0)
Monocytes Absolute: 0.3 10*3/uL (ref 0.1–1.0)
Platelets: 142 10*3/uL — ABNORMAL LOW (ref 150–400)
RBC: 4 MIL/uL (ref 3.87–5.11)

## 2012-06-13 LAB — COMPREHENSIVE METABOLIC PANEL
BUN: 5 mg/dL — ABNORMAL LOW (ref 6–23)
Calcium: 9.1 mg/dL (ref 8.4–10.5)
Creatinine, Ser: 0.61 mg/dL (ref 0.50–1.10)
GFR calc Af Amer: >90 mL/min (ref >90)
GFR calc non Af Amer: >90 mL/min (ref >90)
Glucose, Bld: 93 mg/dL (ref 70–99)
Sodium: 136 mEq/L (ref 135–145)
Total Protein: 7 g/dL (ref 6.0–8.3)

## 2012-06-13 LAB — TROPONIN I: Troponin I: <0.3 ng/mL (ref <0.30)

## 2012-06-13 LAB — POCT I-STAT TROPONIN I: Troponin i, poc: 0 ng/mL (ref 0.00–0.08)

## 2012-06-13 MED ORDER — MORPHINE SULFATE 4 MG/ML IJ SOLN
4.0000 mg | Freq: Once | INTRAMUSCULAR | Status: AC
  Administered 2012-06-13: 4 mg via INTRAVENOUS

## 2012-06-13 MED ORDER — LORAZEPAM 2 MG/ML IJ SOLN
1.0000 mg | Freq: Once | INTRAMUSCULAR | Status: AC
  Administered 2012-06-13: 1 mg via INTRAVENOUS
  Filled 2012-06-13: qty 1

## 2012-06-13 MED ORDER — OXYCODONE-ACETAMINOPHEN 5-325 MG PO TABS
1.0000 | ORAL_TABLET | Freq: Four times a day (QID) | ORAL | Status: DC | PRN
Start: 2012-06-13 — End: 2012-06-21

## 2012-06-13 NOTE — ED Notes (Signed)
Pt took 4 81mg  ASA before coming to the er.

## 2012-06-13 NOTE — ED Provider Notes (Signed)
History   This chart was scribed for Benny Lennert, MD, by Frederik Pear, ER scribe. The patient was seen in room APA14/APA14 and the patient's care was started at 2120.    CSN: [id number]  Arrival date & time 06/13/12  2049   First MD Initiated Contact with Patient 06/13/12 2120      Chief Complaint  Patient presents with  . Chest Pain    (Consider location/radiation/quality/duration/timing/severity/associated sxs/prior treatment) HPI Comments: [name] is a 46 y.o. female who presents to the Emergency Department complaining of constant, moderate chest pain with associated SOB and  described as stabbing that began a few days ago. She was seen in ED on 11/11 for the same symptoms, but has not gotten her prescriptions filled because she did not have the money.   No PCP.    Patient is a 46 y.o. female presenting with chest pain. The history is provided by the patient.  Chest Pain The quality of the pain is described as stabbing. The pain does not radiate. Primary symptoms include shortness of breath. Pertinent negatives for primary symptoms include no fatigue, no cough and no abdominal pain.  Pertinent negatives for associated symptoms include no diaphoresis and no weakness. She tried nothing for the symptoms. Risk factors include smoking/tobacco exposure and oral contraceptive use.  Pertinent negatives for past medical history include no seizures.     Past Medical History  Diagnosis Date  . Bipolar 1 disorder   . Diabetes mellitus   . Fibromyalgia   . Anxiety   . Panic attack     Past Surgical History  Procedure Date  . Mandible surgery   . Cholecystectomy     Family History  Problem Relation Age of Onset  . Heart failure Mother   . Heart failure Other     History  Substance Use Topics  . Smoking status: Current Every Day Smoker -- 1.0 packs/day for 30 years    Types: Cigarettes  . Smokeless tobacco: Not on file  . Alcohol Use: No    OB History      Grav Para Term Preterm Abortions TAB SAB Ect Mult Living   4    2  2          Review of Systems  Constitutional: Negative for diaphoresis and fatigue.  HENT: Negative for congestion, sinus pressure and ear discharge.   Eyes: Negative for discharge.  Respiratory: Positive for shortness of breath. Negative for cough.   Cardiovascular: Positive for chest pain.  Gastrointestinal: Negative for abdominal pain and diarrhea.  Genitourinary: Negative for frequency and hematuria.  Musculoskeletal: Negative for back pain.  Skin: Negative for rash.  Neurological: Negative for seizures, weakness and headaches.  Hematological: Negative.   Psychiatric/Behavioral: Negative for hallucinations.  All other systems reviewed and are negative.    Allergies  Aripiprazole; Cogentin; Haldol; and Procaine hcl  Home Medications   Current Outpatient Rx  Name  Route  Sig  Dispense  Refill  . IBUPROFEN 200 MG PO TABS   Oral   Take 600 mg by mouth every 6 (six) hours as needed. Pain         . INSULIN GLARGINE 100 UNIT/ML [place] SOLN   Subcutaneous   Inject 25 Units into the skin at bedtime. For diabetes.   10 mL      . NAPROXEN 500 MG PO TABS   Oral   Take 500 mg by mouth 2 (two) times daily as needed. Pain  BP 128/76  Pulse 60  Temp 98.4 F (36.9 C) (Oral)  Resp 14  Ht 5\' 1"  (1.549 m)  Wt 210 lb (95.255 kg)  BMI 39.68 kg/m2  SpO2 97%  LMP 09/08/2011  Physical Exam  Constitutional: She is oriented to person, place, and time. She appears well-developed.  HENT:  Head: Normocephalic and atraumatic.  Eyes: Conjunctivae normal and EOM are normal. No scleral icterus.  Neck: Neck supple. No thyromegaly present.  Cardiovascular: Normal rate and regular rhythm.  Exam reveals no gallop and no friction rub.   No murmur heard. Pulmonary/Chest: No stridor. She has no wheezes. She has no rales. She exhibits no tenderness.  Abdominal: She exhibits no distension. There is no tenderness.  There is no rebound.       She is very tender in her left upper chest.  Musculoskeletal: Normal range of motion. She exhibits no edema.  Lymphadenopathy:    She has no cervical adenopathy.  Neurological: She is oriented to person, place, and time. Coordination normal.  Skin: No rash noted. No erythema.  Psychiatric: She has a normal mood and affect. Her behavior is normal.    ED Course  Procedures (including critical care time)  DIAGNOSTIC STUDIES: Oxygen Saturation is 97% on room air, normal by my interpretation.    COORDINATION OF CARE:  21:25- Discussed planned course of treatment with the patient, including a chest X-ray, EKG,  and blood work, who is agreeable at this time.   21:30- Medication Orders- Lorazepam (ATIVAN) injection 1 mg- Once, morphine 4 MG/ML injection 4 mg-once.  Results for orders placed during the hospital encounter of 06/13/12  CBC WITH DIFFERENTIAL      Component Value Range   WBC 6.9  4.0 - 10.5 K/uL   RBC 4.00  3.87 - 5.11 MIL/uL   Hemoglobin 13.2  12.0 - 15.0 g/dL   HCT 16.1  09.6 - 04.5 %   MCV 93.5  78.0 - 100.0 fL   MCH 33.0  26.0 - 34.0 pg   MCHC 35.3  30.0 - 36.0 g/dL   RDW 40.9  81.1 - 91.4 %   Platelets 142 (*) 150 - 400 K/uL   Neutrophils Relative 55  43 - 77 %   Neutro Abs 3.8  1.7 - 7.7 K/uL   Lymphocytes Relative 38  12 - 46 %   Lymphs Abs 2.6  0.7 - 4.0 K/uL   Monocytes Relative 4  3 - 12 %   Monocytes Absolute 0.3  0.1 - 1.0 K/uL   Eosinophils Relative 3  0 - 5 %   Eosinophils Absolute 0.2  0.0 - 0.7 K/uL   Basophils Relative 0  0 - 1 %   Basophils Absolute 0.0  0.0 - 0.1 K/uL  COMPREHENSIVE METABOLIC PANEL      Component Value Range   Sodium 136  135 - 145 mEq/L   Potassium 3.2 (*) 3.5 - 5.1 mEq/L   Chloride 102  96 - 112 mEq/L   CO2 24  19 - 32 mEq/L   Glucose, Bld 93  70 - 99 mg/dL   BUN 5 (*) 6 - 23 mg/dL   Creatinine, Ser 7.82  0.50 - 1.10 mg/dL   Calcium 9.1  8.4 - 95.6 mg/dL   Total Protein 7.0  6.0 - 8.3 g/dL    Albumin 3.5  3.5 - 5.2 g/dL   AST 22  0 - 37 U/L   ALT 14  0 - 35 U/L   Alkaline  Phosphatase 61  39 - 117 U/L   Total Bilirubin 0.2 (*) 0.3 - 1.2 mg/dL   GFR calc non Af Amer >90  >90 mL/min   GFR calc Af Amer >90  >90 mL/min  TROPONIN I      Component Value Range   Troponin I <0.30  <0.30 ng/mL  POCT I-STAT TROPONIN I      Component Value Range   Troponin i, poc 0.00  0.00 - 0.08 ng/mL   Comment 3              Labs Reviewed  POCT I-STAT TROPONIN I  CBC WITH DIFFERENTIAL  COMPREHENSIVE METABOLIC PANEL  TROPONIN I   Dg Chest Portable 1 View  06/13/2012  *RADIOLOGY REPORT*  Clinical Data: Chest pain  PORTABLE CHEST - 1 VIEW  Comparison: Of 06/07/2012  Findings: Heart size upper normal to mildly enlarged.  Partially obscured lung bases is favored to be secondary to overlying soft tissues.  No definite confluent airspace opacity, pleural effusion, or pneumothorax.  No interval osseous change.  IMPRESSION: Heart size upper normal to mildly enlarged.  No radiographic evidence of an acute process.   Original Report Authenticated By: Jearld Lesch, M.D.      No diagnosis found.  Date: 06/13/2012  Rate: 56  Rhythm: sinus bradycardia  QRS Axis: normal  Intervals: normal  ST/T Wave abnormalities: normal  Conduction Disutrbances:none  Narrative Interpretation:   Old EKG Reviewed: none available     MDM    The chart was scribed for me under my direct supervision.  I personally performed the history, physical, and medical decision making and all procedures in the evaluation of this patient.Benny Lennert, MD 06/13/12 [phone]

## 2012-06-13 NOTE — ED Notes (Signed)
Pt reports chest pressure & stabbing sensation. Pt states SOB o2 sats 99% on room air. Pt was seen here last week for te same & did not get scripts filled, did not have money.

## 2012-06-21 ENCOUNTER — Emergency Department (HOSPITAL_COMMUNITY)
Admission: EM | Admit: 2012-06-21 | Discharge: 2012-06-21 | Disposition: A | Discharge status: Home / Self Care | Payer: Medicaid Other | Attending: Emergency Medicine | Admitting: Emergency Medicine

## 2012-06-21 ENCOUNTER — Encounter (HOSPITAL_COMMUNITY): Payer: Self-pay

## 2012-06-21 ENCOUNTER — Emergency Department (HOSPITAL_COMMUNITY): Payer: Medicaid Other

## 2012-06-21 DIAGNOSIS — IMO0001 Reserved for inherently not codable concepts without codable children: Secondary | ICD-10-CM | POA: Insufficient documentation

## 2012-06-21 DIAGNOSIS — F17200 Nicotine dependence, unspecified, uncomplicated: Secondary | ICD-10-CM

## 2012-06-21 DIAGNOSIS — F1910 Other psychoactive substance abuse, uncomplicated: Secondary | ICD-10-CM | POA: Insufficient documentation

## 2012-06-21 DIAGNOSIS — E119 Type 2 diabetes mellitus without complications: Secondary | ICD-10-CM | POA: Insufficient documentation

## 2012-06-21 DIAGNOSIS — G8929 Other chronic pain: Secondary | ICD-10-CM

## 2012-06-21 DIAGNOSIS — Z8659 Personal history of other mental and behavioral disorders: Secondary | ICD-10-CM | POA: Insufficient documentation

## 2012-06-21 DIAGNOSIS — Z79899 Other long term (current) drug therapy: Secondary | ICD-10-CM | POA: Insufficient documentation

## 2012-06-21 DIAGNOSIS — Z794 Long term (current) use of insulin: Secondary | ICD-10-CM | POA: Insufficient documentation

## 2012-06-21 DIAGNOSIS — Z9189 Other specified personal risk factors, not elsewhere classified: Secondary | ICD-10-CM

## 2012-06-21 DIAGNOSIS — R071 Chest pain on breathing: Secondary | ICD-10-CM | POA: Insufficient documentation

## 2012-06-21 LAB — BASIC METABOLIC PANEL
BUN: 2 mg/dL — ABNORMAL LOW (ref 6–23)
Calcium: 9 mg/dL (ref 8.4–10.5)
GFR calc non Af Amer: >90 mL/min (ref >90)
Glucose, Bld: 99 mg/dL (ref 70–99)

## 2012-06-21 MED ORDER — KETOROLAC TROMETHAMINE 30 MG/ML IJ SOLN
15.0000 mg | Freq: Once | INTRAMUSCULAR | Status: DC
  Filled 2012-06-21: qty 1

## 2012-06-21 MED ORDER — HYDROMORPHONE HCL PF 1 MG/ML IJ SOLN: 1.0000 mg | Freq: Once | INTRAMUSCULAR | Status: DC

## 2012-06-21 MED ORDER — ACETAMINOPHEN 325 MG PO TABS
650.0000 mg | ORAL_TABLET | Freq: Once | ORAL | Status: AC
  Administered 2012-06-21: 650 mg via ORAL
  Filled 2012-06-21: qty 2

## 2012-06-21 MED ORDER — HYDROMORPHONE HCL PF 1 MG/ML IJ SOLN
0.5000 mg | Freq: Once | INTRAMUSCULAR | Status: DC
  Filled 2012-06-21: qty 1

## 2012-06-21 MED ORDER — LORAZEPAM 2 MG/ML IJ SOLN: 1.0000 mg | Freq: Once | INTRAMUSCULAR | Status: DC

## 2012-06-21 MED ORDER — LORAZEPAM 1 MG PO TABS
1.0000 mg | ORAL_TABLET | Freq: Once | ORAL | Status: DC
  Filled 2012-06-21: qty 1

## 2012-06-21 MED ORDER — KETOROLAC TROMETHAMINE 30 MG/ML IJ SOLN
30.0000 mg | Freq: Once | INTRAMUSCULAR | Status: AC
  Administered 2012-06-21: 30 mg via INTRAMUSCULAR

## 2012-06-21 NOTE — ED Notes (Signed)
Pt walked assisted by staff to bathroom, explained to rn why she was slow to respond, rn explained pt's speech was slurred, heart rate low and resp slow at times. Stated she had only taken her usual meds.

## 2012-06-21 NOTE — ED Provider Notes (Signed)
History  This chart was scribed for Jones Skene, MD by Shari Heritage, ED Scribe. The patient was seen in room APA05/APA05. Patient's care was started at 1154.  CSN: [id number]  Arrival date & time 06/21/12  1137   First MD Initiated Contact with Patient 06/21/12 1154      Chief Complaint  Patient presents with  . Chest Pain    The history is provided by the patient. No language interpreter was used.    HPI Comments: [name] is a 46 y.o. female who presents to the Emergency Department complaining of sharp, non-radiating, constant, gradually worsening right chest pain onset yesterday at 8pm. Patient says that the chest pain began while she was lying in bed. There is associated SOB, anxiety, fever, chills and dry heaves. Patient also states that she had a spot of vaginal bleeding. Patient denies nausea, vomiting or cough. She denies any recent injury or trauma. No long travel by car or train. Patient has no documented history of blood clots in legs or lungs or heart attack. She does not use an inhaler as a breathing aide. Patient has a medical history of diabetes and anxiety. She states that she hasn't refilled her oral DM medication, but she gives herself insulin injections every night. Patient smokes daily.   Past Medical History  Diagnosis Date  . Bipolar 1 disorder     pt denies, says was misdiagnosed  . Diabetes mellitus   . Fibromyalgia   . Anxiety   . Panic attack     Past Surgical History  Procedure Date  . Mandible surgery   . Cholecystectomy     Family History  Problem Relation Age of Onset  . Heart failure Mother   . Heart failure Other     History  Substance Use Topics  . Smoking status: Current Every Day Smoker -- 1.0 packs/day for 30 years    Types: Cigarettes  . Smokeless tobacco: Not on file  . Alcohol Use: No    OB History    Grav Para Term Preterm Abortions TAB SAB Ect Mult Living   4    2  2          Review of Systems At least 10pt or  greater review of systems completed and are negative except where specified in the HPI.  Allergies  Aripiprazole; Cogentin; Haldol; and Procaine hcl  Home Medications   Current Outpatient Rx  Name  Route  Sig  Dispense  Refill  . IBUPROFEN 200 MG PO TABS   Oral   Take 600 mg by mouth every 6 (six) hours as needed. Pain         . INSULIN GLARGINE 100 UNIT/ML [place] SOLN   Subcutaneous   Inject 25 Units into the skin at bedtime. For diabetes.   10 mL      . NAPROXEN 500 MG PO TABS   Oral   Take 500 mg by mouth 2 (two) times daily as needed. Pain         . OXYCODONE-ACETAMINOPHEN 5-325 MG PO TABS   Oral   Take 1 tablet by mouth every 6 (six) hours as needed for pain.   20 tablet   0     Ht 5\' 1"  (1.549 m)  Wt 210 lb (95.255 kg)  BMI 39.68 kg/m2  LMP 09/08/2011  Physical Exam Nursing notes reviewed.  Electronic medical record reviewed. VITAL SIGNS:   Filed Vitals:   06/21/12 1139  Height: 5\' 1"  (1.549 m)  Weight: 210 lb (95.255 kg)   CONSTITUTIONAL: Somnolent, oriented, appears non-toxic HENT: Atraumatic, normocephalic, oral mucosa pink and moist, airway patent. Nares patent without drainage. External ears normal. EYES: Conjunctiva mildly injected, EOMI, Pupils 2mm and sluggish NECK: Trachea midline, non-tender, supple CARDIOVASCULAR: Normal heart rate, Normal rhythm, No murmurs, rubs, gallops PULMONARY/CHEST: Clear to auscultation, no rhonchi, wheezes, or rales. Symmetrical breath sounds. Non-tender. ABDOMINAL: Non-distended, soft, non-tender - no rebound or guarding.  BS normal. NEUROLOGIC: Non-focal, moving all four extremities, no gross sensory or motor deficits. EXTREMITIES: No clubbing, cyanosis, or edema SKIN: Warm, Dry, No erythema, No rash  ED Course  Procedures (including critical care time)   Date: 06/21/2012  Rate: 63  Rhythm: sinus rhythm, arrythmia  QRS Axis: normal  Intervals: normal  ST/T Wave abnormalities: normal  Conduction Disutrbances:  none  Narrative Interpretation: sinus arrythmia   COORDINATION OF CARE: 12:16 PM- Patient informed of current plan for treatment and evaluation and agrees with plan at this time.  Labs Reviewed  BASIC METABOLIC PANEL - Abnormal; Notable for the following:    Potassium 3.1 (*)     BUN 2 (*)     Creatinine, Ser 0.47 (*)     All other components within normal limits  TROPONIN I    Dg Chest Portable 1 View  06/21/2012  *RADIOLOGY REPORT*  Clinical Data: Chest pain and shortness of breath.  PORTABLE CHEST - 1 VIEW  Comparison: 06/13/2012  Findings: Portable upright view of the chest demonstrates clear lungs. Heart and mediastinum are within normal limits.  The trachea is midline.  Bony thorax is intact.  IMPRESSION: No acute chest findings.   Original Report Authenticated By: Richarda Overlie, M.D.      1. Chronic chest wall pain   2. CIGARETTE SMOKER   3. DRUG ABUSE, HX OF       MDM  [name] is a 46 y.o. female well known to this ER for chronic pain issues presenting with chest pain.  EKG shows sinus arrythmia and Trop is negative.  This pain has been going on since last night - if this were ACS, the troponin would be positve.  Since my exam yields tenderness to palpation of the right chest, and she has had this chest pain before in the same spot without pleuritic nature, my suspicion for PE is also extremely low. Pt had dry heaves last night, but pt abdomen is benign - do not think she ruptured a viscus.   CXR is clear.  Pt offered toradol and tylenol as she does appear somnolent.  RN uncomfortable giving pain medicine initially so she held it which I agree with.  Will provide no narcotics for this patient.  I explained the diagnosis and have given explicit precautions to return to the ER including any other new or worsening symptoms. The patient understands and accepts the medical plan as it's been dictated and I have answered their questions. Discharge instructions concerning home  care and prescriptions have been given.  The patient is STABLE and is discharged to home in good condition.   I personally performed the services described in this documentation, which was scribed in my presence. The recorded information has been reviewed and is accurate. Jones Skene, M.D.      Jones Skene, MD 06/21/12 2052

## 2012-06-21 NOTE — ED Notes (Signed)
Pt cont. To yell at staff, security is at bedside.

## 2012-06-21 NOTE — ED Notes (Signed)
Xray department at bedside for xray. Pt lethargic, rn to bedside. Pt would not hold her head up and eyes closed. Appeared lethargic, was slow to answer ?'s, pt notified of rn holding meds for pain due to lethargy, pt then opened eyes and stated she was ok, that "lights were hurting my eyes"

## 2012-06-21 NOTE — ED Notes (Signed)
EMS reports pt has had chest pain and dry heaves since 8pm last night.    Pt says pain is worse with movement.  Reports pain started while she was laying in bed.  Pt took 4 baby aspirins approx ago.   Pt says pain is very tender to touch.

## 2012-06-21 NOTE — ED Notes (Signed)
Pt yelling and cursing at staff, "you have no idea", tearful, stated "i'm so pissed at you right know", md in agreement to hold meds, aware of pt's vital signs and activity.  rn left exam room when pt began throwing things in exam room and cursing at staff. Security notified.

## 2012-06-21 NOTE — ED Notes (Signed)
Pt called out, requesting meds for pain, heart rate remains 55-60, resp 12-16.  md notified, will hold meds until vss.

## 2012-07-02 ENCOUNTER — Encounter (HOSPITAL_COMMUNITY): Payer: Self-pay | Admitting: *Deleted

## 2012-07-02 ENCOUNTER — Emergency Department (HOSPITAL_COMMUNITY)
Admission: EM | Admit: 2012-07-02 | Discharge: 2012-07-02 | Disposition: A | Discharge status: Home / Self Care | Payer: Medicaid Other | Attending: Emergency Medicine | Admitting: Emergency Medicine

## 2012-07-02 DIAGNOSIS — M549 Dorsalgia, unspecified: Secondary | ICD-10-CM

## 2012-07-02 DIAGNOSIS — M545 Low back pain, unspecified: Secondary | ICD-10-CM | POA: Insufficient documentation

## 2012-07-02 DIAGNOSIS — Z8659 Personal history of other mental and behavioral disorders: Secondary | ICD-10-CM | POA: Insufficient documentation

## 2012-07-02 DIAGNOSIS — E119 Type 2 diabetes mellitus without complications: Secondary | ICD-10-CM | POA: Insufficient documentation

## 2012-07-02 DIAGNOSIS — F17200 Nicotine dependence, unspecified, uncomplicated: Secondary | ICD-10-CM | POA: Insufficient documentation

## 2012-07-02 DIAGNOSIS — Z794 Long term (current) use of insulin: Secondary | ICD-10-CM | POA: Insufficient documentation

## 2012-07-02 DIAGNOSIS — Z8739 Personal history of other diseases of the musculoskeletal system and connective tissue: Secondary | ICD-10-CM | POA: Insufficient documentation

## 2012-07-02 DIAGNOSIS — F319 Bipolar disorder, unspecified: Secondary | ICD-10-CM | POA: Insufficient documentation

## 2012-07-02 DIAGNOSIS — G8929 Other chronic pain: Secondary | ICD-10-CM | POA: Insufficient documentation

## 2012-07-02 MED ORDER — METHOCARBAMOL 500 MG PO TABS
1000.0000 mg | ORAL_TABLET | Freq: Once | ORAL | Status: AC
  Administered 2012-07-02: 1000 mg via ORAL
  Filled 2012-07-02: qty 2

## 2012-07-02 MED ORDER — KETOROLAC TROMETHAMINE 10 MG PO TABS
10.0000 mg | ORAL_TABLET | Freq: Once | ORAL | Status: AC
  Administered 2012-07-02: 10 mg via ORAL
  Filled 2012-07-02: qty 1

## 2012-07-02 MED ORDER — OXYCODONE-ACETAMINOPHEN 5-325 MG PO TABS
1.0000 | ORAL_TABLET | Freq: Once | ORAL | Status: AC
  Administered 2012-07-02: 1 via ORAL
  Filled 2012-07-02: qty 1

## 2012-07-02 MED ORDER — ONDANSETRON HCL 4 MG PO TABS
4.0000 mg | ORAL_TABLET | Freq: Once | ORAL | Status: AC
  Administered 2012-07-02: 4 mg via ORAL
  Filled 2012-07-02: qty 1

## 2012-07-02 NOTE — ED Notes (Signed)
Low back pain, for 3-4 hours, Has chronic back pain, but worse today

## 2012-07-02 NOTE — ED Provider Notes (Signed)
History     CSN: [id number]  Arrival date & time 07/02/12  1452   First MD Initiated Contact with Patient 07/02/12 1610      Chief Complaint  Patient presents with  . Back Pain    (Consider location/radiation/quality/duration/timing/severity/associated sxs/prior treatment) Patient is a 46 y.o. female presenting with back pain. The history is provided by the patient.  Back Pain  This is a chronic problem. The problem occurs daily. The problem has been gradually worsening. The pain is associated with no known injury. The pain is present in the lumbar spine. The quality of the pain is described as aching. The pain does not radiate. The pain is severe. The symptoms are aggravated by certain positions. The pain is the same all the time. Pertinent negatives include no chest pain, no fever, no abdominal pain, no bowel incontinence, no perianal numbness, no bladder incontinence and no dysuria. She has tried NSAIDs for the symptoms.    Past Medical History  Diagnosis Date  . Bipolar 1 disorder     pt denies, says was misdiagnosed  . Diabetes mellitus   . Fibromyalgia   . Anxiety   . Panic attack     Past Surgical History  Procedure Date  . Mandible surgery   . Cholecystectomy     Family History  Problem Relation Age of Onset  . Heart failure Mother   . Heart failure Other     History  Substance Use Topics  . Smoking status: Current Every Day Smoker -- 1.0 packs/day for 30 years    Types: Cigarettes  . Smokeless tobacco: Not on file  . Alcohol Use: No    OB History    Grav Para Term Preterm Abortions TAB SAB Ect Mult Living   4    2  2          Review of Systems  Constitutional: Negative for fever and activity change.       All ROS Neg except as noted in HPI  HENT: Negative for nosebleeds and neck pain.   Eyes: Negative for photophobia and discharge.  Respiratory: Negative for cough, shortness of breath and wheezing.   Cardiovascular: Negative for chest pain and  palpitations.  Gastrointestinal: Negative for abdominal pain, blood in stool and bowel incontinence.  Genitourinary: Negative for bladder incontinence, dysuria, frequency and hematuria.  Musculoskeletal: Positive for back pain. Negative for arthralgias.  Skin: Negative.   Neurological: Negative for dizziness, seizures and speech difficulty.  Psychiatric/Behavioral: Negative for hallucinations and confusion.       Bipolar    Allergies  Aripiprazole; Cogentin; Haldol; and Procaine hcl  Home Medications   Current Outpatient Rx  Name  Route  Sig  Dispense  Refill  . IBUPROFEN 200 MG PO TABS   Oral   Take 800 mg by mouth every 6 (six) hours as needed. pain         . INSULIN GLARGINE 100 UNIT/ML [place] SOLN   Subcutaneous   Inject 25 Units into the skin at bedtime. For diabetes.   10 mL        BP 126/55  Pulse 78  Temp 97.5 F (36.4 C) (Oral)  Resp 18  Ht 5\' 1"  (1.549 m)  Wt 210 lb (95.255 kg)  BMI 39.68 kg/m2  SpO2 99%  LMP 09/08/2011  Physical Exam  Nursing note and vitals reviewed. Constitutional: She is oriented to person, place, and time. She appears well-developed and well-nourished.  Non-toxic appearance.  HENT:  Head: Normocephalic.  Right Ear: Tympanic membrane and external ear normal.  Left Ear: Tympanic membrane and external ear normal.  Eyes: EOM and lids are normal. Pupils are equal, round, and reactive to light.  Neck: Normal range of motion. Neck supple. Carotid bruit is not present.       Neck supple. Sore with ROM .   Cardiovascular: Normal rate, regular rhythm, normal heart sounds, intact distal pulses and normal pulses.   Pulmonary/Chest: Breath sounds normal. No respiratory distress.  Abdominal: Soft. Bowel sounds are normal. There is no tenderness. There is no guarding.  Musculoskeletal: Normal range of motion.       Lumbar paraspinal tenderness to palpation. No step off. No hot areas.  Lymphadenopathy:       Head (right side): No submandibular  adenopathy present.       Head (left side): No submandibular adenopathy present.    She has no cervical adenopathy.  Neurological: She is alert and oriented to person, place, and time. She has normal strength. No cranial nerve deficit or sensory deficit. She exhibits normal muscle tone. Coordination normal.  Skin: Skin is warm and dry.  Psychiatric: Her speech is normal. Her mood appears anxious.    ED Course  Procedures (including critical care time)  Labs Reviewed - No data to display No results found.   No diagnosis found.    MDM  I have reviewed nursing notes, vital signs, and all appropriate lab and imaging results for this patient. Pt presents to the ED by EMS because of  Acute episode of Chronic low back pain. Pt has had several ED visits for this problem. She is unhappy with her PCP because he will not treat her with narcotic pain meds. Pt treated in ED with toradol, percocet and robaxin. Encouraged pt to see her PCP for additional management of pain. Pt slow in changing position, but ambulatory at d/c.      Kathie Dike, [place] 07/02/12 [phone]

## 2012-07-02 NOTE — ED Provider Notes (Signed)
Medical screening examination/treatment/procedure(s) were performed by non-physician practitioner and as supervising physician I was immediately available for consultation/collaboration.   Sarahy Creedon, MD 07/02/12 1903 

## 2012-07-09 ENCOUNTER — Emergency Department (HOSPITAL_COMMUNITY): Payer: Medicaid Other

## 2012-07-09 ENCOUNTER — Encounter (HOSPITAL_COMMUNITY): Payer: Self-pay | Admitting: *Deleted

## 2012-07-09 ENCOUNTER — Emergency Department (HOSPITAL_COMMUNITY)
Admission: EM | Admit: 2012-07-09 | Discharge: 2012-07-09 | Disposition: A | Discharge status: Home / Self Care | Payer: Medicaid Other | Attending: Emergency Medicine | Admitting: Emergency Medicine

## 2012-07-09 DIAGNOSIS — R61 Generalized hyperhidrosis: Secondary | ICD-10-CM | POA: Insufficient documentation

## 2012-07-09 DIAGNOSIS — R079 Chest pain, unspecified: Secondary | ICD-10-CM | POA: Insufficient documentation

## 2012-07-09 DIAGNOSIS — R0602 Shortness of breath: Secondary | ICD-10-CM | POA: Insufficient documentation

## 2012-07-09 DIAGNOSIS — Z794 Long term (current) use of insulin: Secondary | ICD-10-CM | POA: Insufficient documentation

## 2012-07-09 DIAGNOSIS — R112 Nausea with vomiting, unspecified: Secondary | ICD-10-CM | POA: Insufficient documentation

## 2012-07-09 DIAGNOSIS — F17200 Nicotine dependence, unspecified, uncomplicated: Secondary | ICD-10-CM | POA: Insufficient documentation

## 2012-07-09 DIAGNOSIS — E119 Type 2 diabetes mellitus without complications: Secondary | ICD-10-CM | POA: Insufficient documentation

## 2012-07-09 DIAGNOSIS — Z8659 Personal history of other mental and behavioral disorders: Secondary | ICD-10-CM | POA: Insufficient documentation

## 2012-07-09 DIAGNOSIS — G8929 Other chronic pain: Secondary | ICD-10-CM | POA: Insufficient documentation

## 2012-07-09 LAB — BASIC METABOLIC PANEL
Calcium: 9.1 mg/dL (ref 8.4–10.5)
Creatinine, Ser: 0.78 mg/dL (ref 0.50–1.10)
GFR calc Af Amer: >90 mL/min (ref >90)

## 2012-07-09 LAB — TROPONIN I: Troponin I: <0.3 ng/mL (ref <0.30)

## 2012-07-09 LAB — CBC
MCH: 32.9 pg (ref 26.0–34.0)
MCHC: 35.1 g/dL (ref 30.0–36.0)
Platelets: 133 10*3/uL — ABNORMAL LOW (ref 150–400)
RDW: 13.4 % (ref 11.5–15.5)

## 2012-07-09 MED ORDER — LORAZEPAM 2 MG/ML IJ SOLN
1.0000 mg | Freq: Once | INTRAMUSCULAR | Status: AC
  Administered 2012-07-09: 1 mg via INTRAVENOUS
  Filled 2012-07-09: qty 1

## 2012-07-09 MED ORDER — KETOROLAC TROMETHAMINE 30 MG/ML IJ SOLN
30.0000 mg | Freq: Once | INTRAMUSCULAR | Status: AC
Start: 2012-07-09 — End: 2012-07-09
  Administered 2012-07-09: 30 mg via INTRAVENOUS
  Filled 2012-07-09: qty 1

## 2012-07-09 NOTE — ED Notes (Signed)
Chest pain for 3 days,  With sob. And feels anxious

## 2012-07-09 NOTE — ED Provider Notes (Signed)
History   This chart was scribed for Shelda Jakes, MD by [name], ED Scribe. This patient was seen in room APA19/APA19 and the patient's care was started at 9:14 PM    CSN: [id number]  Arrival date & time 07/09/12  2005   First MD Initiated Contact with Patient 07/09/12 2034      Chief Complaint  Patient presents with  . Chest Pain     The history is provided by the patient. No language interpreter was used.   [name] is a 46 y.o. female with h/o bipolar disorder, anxiety, DM, and fibromyalgia who presents to the Emergency Department complaining of 3 days of constant, gradual onset, gradually worsening, dull, non-radiating substernal chest pain reported as 10 out of 10 with associated dyspnea and anxiety.  Pt also reports nausea, non-bloody, non-bilious emesis, and diaphoresis all occasional and secondary to pain.  Pt denies fever, congestion, core throat, rhinorrhea, diarrhea, abdominal pain, neck pain, back pain, dysuria, HA, and rash.  No h/o bleeding easily.   Pt is a current everyday smoker but denies alcohol use.  No PCP.  Past Medical History  Diagnosis Date  . Bipolar 1 disorder     pt denies, says was misdiagnosed  . Diabetes mellitus   . Fibromyalgia   . Anxiety   . Panic attack     Past Surgical History  Procedure Date  . Mandible surgery   . Cholecystectomy     Family History  Problem Relation Age of Onset  . Heart failure Mother   . Heart failure Other     History  Substance Use Topics  . Smoking status: Current Every Day Smoker -- 1.0 packs/day for 30 years    Types: Cigarettes  . Smokeless tobacco: Not on file  . Alcohol Use: No    OB History    Grav Para Term Preterm Abortions TAB SAB Ect Mult Living   4    2  2          Review of Systems  Constitutional: Positive for diaphoresis. Negative for fever.  HENT: Negative for congestion, sore throat, rhinorrhea and neck pain.   Respiratory: Positive for shortness of breath.  Negative for cough.   Cardiovascular: Positive for chest pain.  Gastrointestinal: Positive for nausea and vomiting. Negative for abdominal pain and diarrhea.  Genitourinary: Negative for dysuria.  Musculoskeletal: Negative for back pain.  Skin: Negative for rash.  Neurological: Negative for headaches.  Hematological: Does not bruise/bleed easily.  Psychiatric/Behavioral: Negative for confusion.    Allergies  Aripiprazole; Cogentin; Haldol; and Procaine hcl  Home Medications   Current Outpatient Rx  Name  Route  Sig  Dispense  Refill  . IBUPROFEN 200 MG PO TABS   Oral   Take 800 mg by mouth every 6 (six) hours as needed. pain         . INSULIN GLARGINE 100 UNIT/ML  SOLN   Subcutaneous   Inject 25 Units into the skin at bedtime. For diabetes.   10 mL        BP 118/64  Pulse 67  Temp 97.6 F (36.4 C) (Oral)  Resp 18  Ht 5\' 1"  (1.549 m)  Wt 210 lb (95.255 kg)  BMI 39.68 kg/m2  SpO2 100%  LMP 09/08/2011  Physical Exam  Nursing note and vitals reviewed. Constitutional: She is oriented to person, place, and time. She appears well-developed and well-nourished.  HENT:  Head: Normocephalic and atraumatic.       Mucous  membranes moist.  Eyes: Conjunctivae normal and EOM are normal.  Neck: Normal range of motion. No tracheal deviation present.  Cardiovascular: Normal rate, regular rhythm and normal heart sounds.   Pulmonary/Chest: Effort normal and breath sounds normal. She has no wheezes.  Abdominal: Soft. Bowel sounds are normal. There is no tenderness.  Musculoskeletal: Normal range of motion. She exhibits no edema.  Neurological: She is alert and oriented to person, place, and time. No cranial nerve deficit. Coordination normal.  Skin: Skin is warm. No rash noted.  Psychiatric: She has a normal mood and affect.    ED Course  Procedures (including critical care time) DIAGNOSTIC STUDIES: Oxygen Saturation is 100% on room air, normal by my interpretation.     COORDINATION OF CARE: 9:20 PM- Patient informed of clinical course, understands medical decision-making process, and agrees with plan.  Ordered b-met, CBC, troponin I, and chest XR.  Labs Reviewed  CBC - Abnormal; Notable for the following:    Platelets 133 (*)     All other components within normal limits  BASIC METABOLIC PANEL - Abnormal; Notable for the following:    Potassium 3.2 (*)     All other components within normal limits  TROPONIN I   Dg Chest Port 1 View  07/09/2012  *RADIOLOGY REPORT*  Clinical Data:  Chest pain for 3 days, shortness of breath, anxiety, fibromyalgia, bronchitis, hypertension, diabetes, smoker  PORTABLE CHEST - 1 VIEW  Comparison: Portable exam 2055 hours compared to 06/21/2029  Findings: Upper normal heart size. Mediastinal contours and pulmonary vascularity normal. Lungs clear. No pleural effusion or pneumothorax. Bones unremarkable.  IMPRESSION: No acute abnormalities.   Original Report Authenticated By: Ulyses Southward, M.D.     Date: 07/09/2012  Rate: 59  Rhythm: sinus bradycardia and sinus arrhythmia  QRS Axis: normal  Intervals: normal  ST/T Wave abnormalities: normal  Conduction Disutrbances:none  Narrative Interpretation:   Old EKG Reviewed: unchanged EKG is unchanged from 06/21/2012.  Results for orders placed during the hospital encounter of 07/09/12  CBC      Component Value Range   WBC 6.3  4.0 - 10.5 K/uL   RBC 4.13  3.87 - 5.11 MIL/uL   Hemoglobin 13.6  12.0 - 15.0 g/dL   HCT 04.5  40.9 - 81.1 %   MCV 93.9  78.0 - 100.0 fL   MCH 32.9  26.0 - 34.0 pg   MCHC 35.1  30.0 - 36.0 g/dL   RDW 91.4  78.2 - 95.6 %   Platelets 133 (*) 150 - 400 K/uL  TROPONIN I      Component Value Range   Troponin I <0.30  <0.30 ng/mL  BASIC METABOLIC PANEL      Component Value Range   Sodium 139  135 - 145 mEq/L   Potassium 3.2 (*) 3.5 - 5.1 mEq/L   Chloride 102  96 - 112 mEq/L   CO2 27  19 - 32 mEq/L   Glucose, Bld 83  70 - 99 mg/dL   BUN 6  6 - 23  mg/dL   Creatinine, Ser 2.13  0.50 - 1.10 mg/dL   Calcium 9.1  8.4 - 08.6 mg/dL   GFR calc non Af Amer >90  >90 mL/min   GFR calc Af Amer >90  >90 mL/min     1. Chronic chest pain       MDM  Patient with history of chronic back pain chronic chest pain. Patient's workup for chest pain today is negative troponin is  negative EKG without any acute changes no significant lab abnormalities. Chest x-ray without evidence of pneumothorax or pneumonia. Chest pain is consistent with type pain she's had in the past had this pain nonstop for 3 days and troponin marker should be positive it was significant chest pain. Patient treated in the emergency part with some Ativan since anxiety seems to play a role with her chest pain. For pain she was given Toradol. Patient has past history of drug-seeking behavior and substance problems. Patient currently does not have a primary care doctor was given resource guide to find a Dr.     I personally performed the services described in this documentation, which was scribed in my presence. The recorded information has been reviewed and is accurate.           Shelda Jakes, MD 07/09/12 2153

## 2012-07-12 ENCOUNTER — Emergency Department (HOSPITAL_COMMUNITY)
Admission: EM | Admit: 2012-07-12 | Discharge: 2012-07-12 | Disposition: A | Discharge status: Home / Self Care | Payer: Medicaid Other | Attending: Emergency Medicine | Admitting: Emergency Medicine

## 2012-07-12 ENCOUNTER — Encounter (HOSPITAL_COMMUNITY): Payer: Self-pay

## 2012-07-12 DIAGNOSIS — F411 Generalized anxiety disorder: Secondary | ICD-10-CM | POA: Insufficient documentation

## 2012-07-12 DIAGNOSIS — T148XXA Other injury of unspecified body region, initial encounter: Secondary | ICD-10-CM | POA: Insufficient documentation

## 2012-07-12 DIAGNOSIS — Z794 Long term (current) use of insulin: Secondary | ICD-10-CM | POA: Insufficient documentation

## 2012-07-12 DIAGNOSIS — F1210 Cannabis abuse, uncomplicated: Secondary | ICD-10-CM | POA: Insufficient documentation

## 2012-07-12 DIAGNOSIS — E119 Type 2 diabetes mellitus without complications: Secondary | ICD-10-CM | POA: Insufficient documentation

## 2012-07-12 DIAGNOSIS — M5432 Sciatica, left side: Secondary | ICD-10-CM

## 2012-07-12 DIAGNOSIS — F319 Bipolar disorder, unspecified: Secondary | ICD-10-CM | POA: Insufficient documentation

## 2012-07-12 DIAGNOSIS — Y9389 Activity, other specified: Secondary | ICD-10-CM | POA: Insufficient documentation

## 2012-07-12 DIAGNOSIS — Y929 Unspecified place or not applicable: Secondary | ICD-10-CM | POA: Insufficient documentation

## 2012-07-12 DIAGNOSIS — F17200 Nicotine dependence, unspecified, uncomplicated: Secondary | ICD-10-CM | POA: Insufficient documentation

## 2012-07-12 DIAGNOSIS — X500XXA Overexertion from strenuous movement or load, initial encounter: Secondary | ICD-10-CM | POA: Insufficient documentation

## 2012-07-12 DIAGNOSIS — M5430 Sciatica, unspecified side: Secondary | ICD-10-CM | POA: Insufficient documentation

## 2012-07-12 DIAGNOSIS — Z79899 Other long term (current) drug therapy: Secondary | ICD-10-CM | POA: Insufficient documentation

## 2012-07-12 MED ORDER — IBUPROFEN 800 MG PO TABS
800.0000 mg | ORAL_TABLET | Freq: Once | ORAL | Status: AC
  Administered 2012-07-12: 800 mg via ORAL
  Filled 2012-07-12: qty 1

## 2012-07-12 MED ORDER — OXYCODONE-ACETAMINOPHEN 5-325 MG PO TABS
1.0000 | ORAL_TABLET | Freq: Once | ORAL | Status: AC
  Administered 2012-07-12: 1 via ORAL
  Filled 2012-07-12: qty 1

## 2012-07-12 MED ORDER — OXYCODONE-ACETAMINOPHEN 5-325 MG PO TABS: ORAL_TABLET | ORAL | Status: DC

## 2012-07-12 NOTE — ED Notes (Signed)
Pt reports back pain/groin pain from pulling a dog house yesterday.  Pt reports difficulty walking this a.m.

## 2012-07-12 NOTE — ED Provider Notes (Signed)
History     CSN: [id number]  Arrival date & time 07/12/12  4098   First MD Initiated Contact with Patient 07/12/12 1042      Chief Complaint  Patient presents with  . Back Pain    (Consider location/radiation/quality/duration/timing/severity/associated sxs/prior treatment) HPI Comments: Pt has frequent problems with L sciatica.  She injured her L groin pulling a dog house yest.  Patient is a 46 y.o. female presenting with back pain. The history is provided by the patient. No language interpreter was used.  Back Pain  This is a new problem. The current episode started yesterday. The problem occurs constantly. The problem has not changed since onset.The pain is present in the lumbar spine (L groin). The pain is severe. Associated symptoms include leg pain. Pertinent negatives include no bowel incontinence, no perianal numbness, no bladder incontinence, no dysuria, no pelvic pain, no paresthesias, no paresis, no tingling and no weakness. She has tried nothing for the symptoms.    Past Medical History  Diagnosis Date  . Bipolar 1 disorder     pt denies, says was misdiagnosed  . Diabetes mellitus   . Fibromyalgia   . Anxiety   . Panic attack     Past Surgical History  Procedure Date  . Mandible surgery   . Cholecystectomy     Family History  Problem Relation Age of Onset  . Heart failure Mother   . Heart failure Other     History  Substance Use Topics  . Smoking status: Current Every Day Smoker -- 1.0 packs/day for 30 years    Types: Cigarettes  . Smokeless tobacco: Not on file  . Alcohol Use: No    OB History    Grav Para Term Preterm Abortions TAB SAB Ect Mult Living   4    2  2          Review of Systems  Gastrointestinal: Negative for bowel incontinence.  Genitourinary: Negative for bladder incontinence, dysuria and pelvic pain.  Musculoskeletal: Positive for back pain.  Neurological: Negative for tingling, weakness and paresthesias.  All other systems  reviewed and are negative.    Allergies  Aripiprazole; Cogentin; Haldol; and Procaine hcl  Home Medications   Current Outpatient Rx  Name  Route  Sig  Dispense  Refill  . ALPRAZOLAM 1 MG PO TABS   Oral   Take 1 mg by mouth daily as needed. Anxiety         . IBUPROFEN 600 MG PO TABS   Oral   Take 600 mg by mouth every 6 (six) hours as needed. Pain         . INSULIN GLARGINE 100 UNIT/ML [place] SOLN   Subcutaneous   Inject 25 Units into the skin at bedtime. For diabetes.   10 mL      . ADULT MULTIVITAMIN W/MINERALS CH   Oral   Take 1 tablet by mouth daily.         . OXYCODONE-ACETAMINOPHEN 5-325 MG PO TABS      One tab po q 6 hrs prn pain   15 tablet   0     BP 150/67  Pulse 67  Temp 97.5 F (36.4 C) (Oral)  Resp 16  SpO2 97%  LMP 09/08/2011  Physical Exam  Nursing note and vitals reviewed. Constitutional: She is oriented to person, place, and time. She appears well-developed and well-nourished. No distress.  HENT:  Head: Normocephalic and atraumatic.  Eyes: EOM are normal.  Neck: Normal range  of motion.  Cardiovascular: Normal rate, regular rhythm and normal heart sounds.   Pulmonary/Chest: Effort normal and breath sounds normal.  Abdominal: Soft. She exhibits no distension. There is no tenderness.  Musculoskeletal: She exhibits tenderness.       Left hip: She exhibits decreased range of motion. She exhibits no tenderness.       Legs: Neurological: She is alert and oriented to person, place, and time.  Skin: Skin is warm and dry.  Psychiatric: She has a normal mood and affect. Judgment normal.    ED Course  Procedures (including critical care time)  Labs Reviewed - No data to display No results found.   1. Muscle strain   2. Left sided sciatica       MDM  rx-percocet, 15 Ibuprofen 800 mg TID F/u with PCP        Evalina Field, PA 07/12/12 1132

## 2012-07-14 NOTE — ED Provider Notes (Signed)
Medical screening examination/treatment/procedure(s) were performed by non-physician practitioner and as supervising physician I was immediately available for consultation/collaboration.   Adja Ruff M Greycen Felter, DO 07/14/12 1236 

## 2012-07-22 ENCOUNTER — Emergency Department (HOSPITAL_COMMUNITY)
Admission: EM | Admit: 2012-07-22 | Discharge: 2012-07-22 | Disposition: A | Discharge status: Home / Self Care | Payer: Medicaid Other | Attending: Emergency Medicine | Admitting: Emergency Medicine

## 2012-07-22 ENCOUNTER — Encounter (HOSPITAL_COMMUNITY): Payer: Self-pay

## 2012-07-22 DIAGNOSIS — Z9089 Acquired absence of other organs: Secondary | ICD-10-CM | POA: Insufficient documentation

## 2012-07-22 DIAGNOSIS — F17200 Nicotine dependence, unspecified, uncomplicated: Secondary | ICD-10-CM | POA: Insufficient documentation

## 2012-07-22 DIAGNOSIS — E119 Type 2 diabetes mellitus without complications: Secondary | ICD-10-CM | POA: Insufficient documentation

## 2012-07-22 DIAGNOSIS — IMO0001 Reserved for inherently not codable concepts without codable children: Secondary | ICD-10-CM | POA: Insufficient documentation

## 2012-07-22 DIAGNOSIS — F319 Bipolar disorder, unspecified: Secondary | ICD-10-CM | POA: Insufficient documentation

## 2012-07-22 DIAGNOSIS — M549 Dorsalgia, unspecified: Secondary | ICD-10-CM | POA: Insufficient documentation

## 2012-07-22 DIAGNOSIS — Z794 Long term (current) use of insulin: Secondary | ICD-10-CM | POA: Insufficient documentation

## 2012-07-22 DIAGNOSIS — R1110 Vomiting, unspecified: Secondary | ICD-10-CM | POA: Insufficient documentation

## 2012-07-22 DIAGNOSIS — F410 Panic disorder [episodic paroxysmal anxiety] without agoraphobia: Secondary | ICD-10-CM | POA: Insufficient documentation

## 2012-07-22 DIAGNOSIS — Z79899 Other long term (current) drug therapy: Secondary | ICD-10-CM | POA: Insufficient documentation

## 2012-07-22 LAB — COMPREHENSIVE METABOLIC PANEL
ALT: 15 U/L (ref 0–35)
Albumin: 3.5 g/dL (ref 3.5–5.2)
Alkaline Phosphatase: 67 U/L (ref 39–117)
BUN: 7 mg/dL (ref 6–23)
Chloride: 104 mEq/L (ref 96–112)
Glucose, Bld: 99 mg/dL (ref 70–99)
Potassium: 3.6 mEq/L (ref 3.5–5.1)
Sodium: 137 mEq/L (ref 135–145)
Total Bilirubin: 0.3 mg/dL (ref 0.3–1.2)

## 2012-07-22 LAB — CBC WITH DIFFERENTIAL/PLATELET
Basophils Relative: 0 % (ref 0–1)
Hemoglobin: 13.4 g/dL (ref 12.0–15.0)
Lymphs Abs: 2.3 10*3/uL (ref 0.7–4.0)
Monocytes Relative: 6 % (ref 3–12)
Neutro Abs: 3.2 10*3/uL (ref 1.7–7.7)
Neutrophils Relative %: 53 % (ref 43–77)
Platelets: 124 10*3/uL — ABNORMAL LOW (ref 150–400)
RBC: 4.04 MIL/uL (ref 3.87–5.11)

## 2012-07-22 MED ORDER — SODIUM CHLORIDE 0.9 % IV BOLUS (SEPSIS)
1000.0000 mL | Freq: Once | INTRAVENOUS | Status: AC
  Administered 2012-07-22: 1000 mL via INTRAVENOUS

## 2012-07-22 MED ORDER — HYDROMORPHONE HCL PF 1 MG/ML IJ SOLN
1.0000 mg | Freq: Once | INTRAMUSCULAR | Status: AC
  Administered 2012-07-22: 1 mg via INTRAVENOUS
  Filled 2012-07-22: qty 1

## 2012-07-22 MED ORDER — ONDANSETRON HCL 4 MG/2ML IJ SOLN
4.0000 mg | Freq: Once | INTRAMUSCULAR | Status: AC
  Administered 2012-07-22: 4 mg via INTRAVENOUS
  Filled 2012-07-22: qty 2

## 2012-07-22 MED ORDER — PROMETHAZINE HCL 25 MG PO TABS: 25.0000 mg | ORAL_TABLET | Freq: Four times a day (QID) | ORAL | Status: DC | PRN

## 2012-07-22 MED ORDER — HYDROCODONE-ACETAMINOPHEN 5-325 MG PO TABS: 1.0000 | ORAL_TABLET | Freq: Four times a day (QID) | ORAL | Status: DC | PRN

## 2012-07-22 NOTE — ED Notes (Signed)
C/o vomiting since yesterday. And chronic back pain. Ambulated to room without difficulty.

## 2012-07-22 NOTE — ED Provider Notes (Signed)
History    This chart was scribed for Andrea Lennert, MD, MD by Smitty Pluck, ED Scribe. The patient was seen in room APA04 and the patient's care was started at 10:06 AM.   CSN: [id number]  Arrival date & time 07/22/12  0945      Chief Complaint  Patient presents with  . Emesis    (Consider location/radiation/quality/duration/timing/severity/associated sxs/prior treatment) Patient is a 46 y.o. female presenting with vomiting and back pain. The history is provided by the patient. No language interpreter was used.  Emesis  This is a new problem. The current episode started yesterday. The problem occurs 2 to 4 times per day. The problem has not changed since onset.The emesis has an appearance of stomach contents. There has been no fever. Pertinent negatives include no abdominal pain, no cough, no diarrhea and no headaches.  Back Pain  This is a chronic problem. The current episode started yesterday. The problem occurs constantly. The problem has not changed since onset.The pain is associated with no known injury. The pain is present in the lumbar spine. The pain is moderate. Pertinent negatives include no chest pain, no headaches and no abdominal pain.   LUCIANA CAMMARATA is a 46 y.o. female with hx of fibromyalgia, DM, bipolar disorder who presents to the Emergency Department complaining of constant, moderate lower back pain onset 1 day ago. Pt reports that she recently had xray of back in ED. She states that she has been vomiting since yesterday. Pt has taken Norco for pain with minor relief. She denies any other pain.   Past Medical History  Diagnosis Date  . Bipolar 1 disorder     pt denies, says was misdiagnosed  . Diabetes mellitus   . Fibromyalgia   . Anxiety   . Panic attack     Past Surgical History  Procedure Date  . Mandible surgery   . Cholecystectomy     Family History  Problem Relation Age of Onset  . Heart failure Mother   . Heart failure Other     History   Substance Use Topics  . Smoking status: Current Every Day Smoker -- 1.0 packs/day for 30 years    Types: Cigarettes  . Smokeless tobacco: Not on file  . Alcohol Use: No    OB History    Grav Para Term Preterm Abortions TAB SAB Ect Mult Living   4    2  2          Review of Systems  Constitutional: Negative for fatigue.  HENT: Negative for congestion, sinus pressure and ear discharge.   Eyes: Negative for discharge.  Respiratory: Negative for cough.   Cardiovascular: Negative for chest pain.  Gastrointestinal: Positive for vomiting. Negative for abdominal pain and diarrhea.  Genitourinary: Negative for frequency and hematuria.  Musculoskeletal: Positive for back pain.  Skin: Negative for rash.  Neurological: Negative for seizures and headaches.  Hematological: Negative.   Psychiatric/Behavioral: Negative for hallucinations.  All other systems reviewed and are negative.    Allergies  Aripiprazole; Cogentin; Haldol; and Procaine hcl  Home Medications   Current Outpatient Rx  Name  Route  Sig  Dispense  Refill  . ALPRAZOLAM 1 MG PO TABS   Oral   Take 1 mg by mouth daily as needed. Anxiety         . IBUPROFEN 600 MG PO TABS   Oral   Take 600 mg by mouth every 6 (six) hours as needed. Pain         .  INSULIN GLARGINE 100 UNIT/ML [place] SOLN   Subcutaneous   Inject 25 Units into the skin at bedtime. For diabetes.   10 mL      . ADULT MULTIVITAMIN W/MINERALS CH   Oral   Take 1 tablet by mouth daily.         . OXYCODONE-ACETAMINOPHEN 5-325 MG PO TABS      One tab po q 6 hrs prn pain   15 tablet   0     There were no vitals taken for this visit.  Physical Exam  Nursing note and vitals reviewed. Constitutional: She is oriented to person, place, and time. She appears well-developed.  HENT:  Head: Normocephalic and atraumatic.  Eyes: Conjunctivae normal and EOM are normal. No scleral icterus.  Neck: Neck supple. No thyromegaly present.  Cardiovascular:  Normal rate and regular rhythm.  Exam reveals no gallop and no friction rub.   No murmur heard. Pulmonary/Chest: No stridor. She has no wheezes. She has no rales. She exhibits no tenderness.  Abdominal: She exhibits no distension. There is no tenderness. There is no rebound.  Musculoskeletal: Normal range of motion. She exhibits no edema.       Moderate lumbar spine tenderness   Lymphadenopathy:    She has no cervical adenopathy.  Neurological: She is oriented to person, place, and time. Coordination normal.  Skin: No rash noted. No erythema.  Psychiatric: She has a normal mood and affect. Her behavior is normal.    ED Course  Procedures (including critical care time) .    COORDINATION OF CARE: 10:11 AM Discussed ED treatment with pt     Labs Reviewed  CBC WITH DIFFERENTIAL - Abnormal; Notable for the following:    Platelets 124 (*)     All other components within normal limits  COMPREHENSIVE METABOLIC PANEL   No results found.   No diagnosis found.    MDM   The chart was scribed for me under my direct supervision.  I personally performed the history, physical, and medical decision making and all procedures in the evaluation of this patient.Andrea Lennert, MD 07/22/12 [phone]

## 2012-07-22 NOTE — ED Notes (Signed)
Patient with no complaints at this time. Respirations even and unlabored. Skin warm/dry. Discharge instructions reviewed with patient at this time. Patient given opportunity to voice concerns/ask questions. Patient pulled out IV and band-aid applied to site. Patient discharged at this time and left Emergency Department with steady gait.

## 2012-07-30 ENCOUNTER — Emergency Department (HOSPITAL_COMMUNITY)
Admission: EM | Admit: 2012-07-30 | Discharge: 2012-07-30 | Disposition: A | Discharge status: Home / Self Care | Payer: Medicaid Other | Attending: Emergency Medicine | Admitting: Emergency Medicine

## 2012-07-30 ENCOUNTER — Encounter (HOSPITAL_COMMUNITY): Payer: Self-pay | Admitting: Emergency Medicine

## 2012-07-30 ENCOUNTER — Emergency Department (HOSPITAL_COMMUNITY): Payer: Medicaid Other

## 2012-07-30 DIAGNOSIS — Z79899 Other long term (current) drug therapy: Secondary | ICD-10-CM | POA: Insufficient documentation

## 2012-07-30 DIAGNOSIS — F17200 Nicotine dependence, unspecified, uncomplicated: Secondary | ICD-10-CM | POA: Insufficient documentation

## 2012-07-30 DIAGNOSIS — R42 Dizziness and giddiness: Secondary | ICD-10-CM | POA: Insufficient documentation

## 2012-07-30 DIAGNOSIS — IMO0001 Reserved for inherently not codable concepts without codable children: Secondary | ICD-10-CM | POA: Insufficient documentation

## 2012-07-30 DIAGNOSIS — E119 Type 2 diabetes mellitus without complications: Secondary | ICD-10-CM | POA: Insufficient documentation

## 2012-07-30 DIAGNOSIS — F319 Bipolar disorder, unspecified: Secondary | ICD-10-CM | POA: Insufficient documentation

## 2012-07-30 DIAGNOSIS — R0789 Other chest pain: Secondary | ICD-10-CM

## 2012-07-30 DIAGNOSIS — F419 Anxiety disorder, unspecified: Secondary | ICD-10-CM

## 2012-07-30 DIAGNOSIS — R071 Chest pain on breathing: Secondary | ICD-10-CM | POA: Insufficient documentation

## 2012-07-30 DIAGNOSIS — F411 Generalized anxiety disorder: Secondary | ICD-10-CM | POA: Insufficient documentation

## 2012-07-30 DIAGNOSIS — F410 Panic disorder [episodic paroxysmal anxiety] without agoraphobia: Secondary | ICD-10-CM | POA: Insufficient documentation

## 2012-07-30 LAB — CBC WITH DIFFERENTIAL/PLATELET
Basophils Absolute: 0 10*3/uL (ref 0.0–0.1)
Basophils Relative: 0 % (ref 0–1)
Eosinophils Absolute: 0.1 10*3/uL (ref 0.0–0.7)
Eosinophils Relative: 2 % (ref 0–5)
HCT: 38.7 % (ref 36.0–46.0)
Hemoglobin: 13.7 g/dL (ref 12.0–15.0)
MCH: 33.5 pg (ref 26.0–34.0)
MCHC: 35.4 g/dL (ref 30.0–36.0)
MCV: 94.6 fL (ref 78.0–100.0)
Monocytes Absolute: 0.4 10*3/uL (ref 0.1–1.0)
Monocytes Relative: 6 % (ref 3–12)
Neutro Abs: 3.3 10*3/uL (ref 1.7–7.7)
RDW: 13.5 % (ref 11.5–15.5)

## 2012-07-30 LAB — COMPREHENSIVE METABOLIC PANEL
AST: 18 U/L (ref 0–37)
Albumin: 3.4 g/dL — ABNORMAL LOW (ref 3.5–5.2)
BUN: 3 mg/dL — ABNORMAL LOW (ref 6–23)
Calcium: 9.1 mg/dL (ref 8.4–10.5)
Creatinine, Ser: 0.52 mg/dL (ref 0.50–1.10)
Total Protein: 7.2 g/dL (ref 6.0–8.3)

## 2012-07-30 LAB — TROPONIN I: Troponin I: <0.3 ng/mL (ref <0.30)

## 2012-07-30 MED ORDER — KETOROLAC TROMETHAMINE 60 MG/2ML IM SOLN
60.0000 mg | Freq: Once | INTRAMUSCULAR | Status: AC
  Administered 2012-07-30: 60 mg via INTRAMUSCULAR
  Filled 2012-07-30: qty 2

## 2012-07-30 MED ORDER — IBUPROFEN 800 MG PO TABS
800.0000 mg | ORAL_TABLET | Freq: Once | ORAL | Status: AC
Start: 2012-07-30 — End: 2012-07-30
  Administered 2012-07-30: 800 mg via ORAL
  Filled 2012-07-30: qty 1

## 2012-07-30 MED ORDER — LORAZEPAM 1 MG PO TABS
1.0000 mg | ORAL_TABLET | Freq: Once | ORAL | Status: AC
  Administered 2012-07-30: 1 mg via ORAL
  Filled 2012-07-30: qty 1

## 2012-07-30 NOTE — ED Provider Notes (Signed)
History   This chart was scribed for Andrea Octave, MD by [name], ED Scribe. This patient was seen in room APA12/APA12 and the patient's care was started at 3:16 PM    CSN: [id number]  Arrival date & time 07/30/12  1432   First MD Initiated Contact with Patient 07/30/12 1501      Chief Complaint  Patient presents with  . Anxiety  . Chest Pain     The history is provided by the patient. No language interpreter was used.  [name] is a 47 y.o. female with h/o bipolar disorder 1, anxiety, panic attacks, and DM who presents to the Emergency Department complaining of constant, burning, mild, non-radiating, non-worsening chest pain with gradual onset 3 days ago that has been minimally improved by extra strength Tylenol and ibuprofen and is similar in type and severity to h/o chest pain associated with anxiety.  Pt states she became suddenly dizzy when lying supine on bed here in ED but that this was temporary, has never occurred before, and has since resolved and dizziness is not present during exam.  Pt denies nausea, emesis, swelling in legs, abdominal pain, cough, fever.  Pt states she used to take Xanax for anxiety but has not had prescription filled in several months and has appointment at [facility] to have it filled 01/23.   Past Medical History  Diagnosis Date  . Bipolar 1 disorder     pt denies, says was misdiagnosed  . Diabetes mellitus   . Fibromyalgia   . Anxiety   . Panic attack     Past Surgical History  Procedure Date  . Mandible surgery   . Cholecystectomy     Family History  Problem Relation Age of Onset  . Heart failure Mother   . Heart failure Other     History  Substance Use Topics  . Smoking status: Current Every Day Smoker -- 1.0 packs/day for 30 years    Types: Cigarettes  . Smokeless tobacco: Not on file  . Alcohol Use: No    OB History    Grav Para Term Preterm Abortions TAB SAB Ect Mult Living   4    2  2          Review of  Systems A complete 10 system review of systems was obtained and all systems are negative except as noted in the HPI and PMH.   Allergies  Procaine hcl; Aripiprazole; Cogentin; and Haldol  Home Medications   Current Outpatient Rx  Name  Route  Sig  Dispense  Refill  . ACETAMINOPHEN 500 MG PO TABS   Oral   Take 1,000 mg by mouth every 6 (six) hours as needed. Pain         . ALPRAZOLAM 1 MG PO TABS   Oral   Take 1 mg by mouth daily as needed. Anxiety         . INSULIN GLARGINE 100 UNIT/ML [place] SOLN   Subcutaneous   Inject 25 Units into the skin at bedtime. For diabetes.   10 mL      . ADULT MULTIVITAMIN W/MINERALS CH   Oral   Take 1 tablet by mouth daily.         Marland Kitchen PROMETHAZINE HCL 25 MG PO TABS   Oral   Take 1 tablet (25 mg total) by mouth every 6 (six) hours as needed for nausea.   15 tablet   0     BP 129/82  Pulse 71  Temp  98 F (36.7 C) (Oral)  Resp 20  Ht 5\' 1"  (1.549 m)  Wt 200 lb (90.719 kg)  BMI 37.79 kg/m2  SpO2 100%  Physical Exam  Nursing note and vitals reviewed. Constitutional: She is oriented to person, place, and time. She appears well-developed and well-nourished.       Appears anxious  HENT:  Head: Normocephalic and atraumatic.  Eyes: Conjunctivae normal are normal.  Neck: Normal range of motion. No tracheal deviation present.  Cardiovascular: Normal rate, regular rhythm and normal heart sounds.   No murmur heard.      Intact distal pulses.  Pulmonary/Chest: Effort normal and breath sounds normal. She has no wheezes.       Reproducible sternal chest pain  Abdominal: Soft. Bowel sounds are normal. There is no tenderness.  Musculoskeletal: Normal range of motion. She exhibits no edema.  Neurological: She is alert and oriented to person, place, and time.       Equal grip strength bilaterally.   Skin: Skin is warm and dry.  Psychiatric: She has a normal mood and affect. Her behavior is normal.    ED Course  Procedures (including  critical care time) DIAGNOSTIC STUDIES: Oxygen Saturation is 100% on room air, normal by my interpretation.    COORDINATION OF CARE: 3:22 PM- Patient informed of clinical course, understands medical decision-making process, and agrees with plan.  Ordered PO ibuprofen, PO ativan, troponin I, CBC, c-met, EKG, and chest XR. 4:57 PM- Pt reports pain has not reduced.  Informed pt that XR was negative.  Results for orders placed during the hospital encounter of 07/30/12  TROPONIN I      Component Value Range   Troponin I <0.30  <0.30 ng/mL  CBC WITH DIFFERENTIAL      Component Value Range   WBC 5.9  4.0 - 10.5 K/uL   RBC 4.09  3.87 - 5.11 MIL/uL   Hemoglobin 13.7  12.0 - 15.0 g/dL   HCT 16.1  09.6 - 04.5 %   MCV 94.6  78.0 - 100.0 fL   MCH 33.5  26.0 - 34.0 pg   MCHC 35.4  30.0 - 36.0 g/dL   RDW 40.9  81.1 - 91.4 %   Platelets 125 (*) 150 - 400 K/uL   Neutrophils Relative 56  43 - 77 %   Neutro Abs 3.3  1.7 - 7.7 K/uL   Lymphocytes Relative 36  12 - 46 %   Lymphs Abs 2.1  0.7 - 4.0 K/uL   Monocytes Relative 6  3 - 12 %   Monocytes Absolute 0.4  0.1 - 1.0 K/uL   Eosinophils Relative 2  0 - 5 %   Eosinophils Absolute 0.1  0.0 - 0.7 K/uL   Basophils Relative 0  0 - 1 %   Basophils Absolute 0.0  0.0 - 0.1 K/uL  COMPREHENSIVE METABOLIC PANEL      Component Value Range   Sodium 138  135 - 145 mEq/L   Potassium 3.6  3.5 - 5.1 mEq/L   Chloride 104  96 - 112 mEq/L   CO2 26  19 - 32 mEq/L   Glucose, Bld 90  70 - 99 mg/dL   BUN 3 (*) 6 - 23 mg/dL   Creatinine, Ser 7.82  0.50 - 1.10 mg/dL   Calcium 9.1  8.4 - 95.6 mg/dL   Total Protein 7.2  6.0 - 8.3 g/dL   Albumin 3.4 (*) 3.5 - 5.2 g/dL   AST 18  0 -  37 U/L   ALT 14  0 - 35 U/L   Alkaline Phosphatase 80  39 - 117 U/L   Total Bilirubin 0.2 (*) 0.3 - 1.2 mg/dL   GFR calc non Af Amer >90  >90 mL/min   GFR calc Af Amer >90  >90 mL/min    Dg Chest 2 View  07/30/2012  *RADIOLOGY REPORT*  Clinical Data: Chest pain, history of diabetes   CHEST - 2 VIEW  Comparison: 07/09/2012; 06/21/2012; 06/13/2012  Findings:  Grossly unchanged cardiac silhouette and mediastinal contours. Minimal left basilar heterogeneous opacities favored to represent atelectasis or scar.  No focal airspace opacities.  No pleural effusion or pneumothorax.  Unchanged bones.  IMPRESSION: Minimal left basilar atelectasis without acute cardiopulmonary disease.   Original Report Authenticated By: Tacey Ruiz, MD      No diagnosis found.    MDM  Constant substernal chest pain for the past 3 days it does not radiate. Similar to previous anxiety type pain. Xanax for several months. Denies any shortness of breath, nausea, vomiting, diaphoresis. No cardiac history. EKG nonischemic unchanged. CXR negative. Labs unremarkable.  Troponin negative. PERC negative Presentation not typical of ACS or PE.   Date: 07/30/2012  Rate: 52  Rhythm: normal sinus rhythm  QRS Axis: normal  Intervals: normal  ST/T Wave abnormalities: normal  Conduction Disutrbances:none  Narrative Interpretation:   Old EKG Reviewed: unchanged    I personally performed the services described in this documentation, which was scribed in my presence. The recorded information has been reviewed and is accurate.         Andrea Octave, MD 07/30/12 1729

## 2012-07-30 NOTE — ED Notes (Signed)
Pt states out of her nerve medication and doesn't see doctor until 08-23-2011. Pt states chest pain for three days.

## 2012-07-30 NOTE — ED Notes (Signed)
Pt returns to Ed secondary to chest pain x 3 days. Pt denies radiation of pain, diaphoresis and N/V.  NAD noted. Pt states has had this pain in past and has been treated for chest wall pain and for "nerves'. Pt also states is out of xanax to which she she sees MD on 84 th of January. NAD noted

## 2012-08-09 ENCOUNTER — Emergency Department (HOSPITAL_COMMUNITY)
Admission: EM | Admit: 2012-08-09 | Discharge: 2012-08-09 | Disposition: A | Discharge status: Home / Self Care | Payer: Medicaid Other | Attending: Emergency Medicine | Admitting: Emergency Medicine

## 2012-08-09 ENCOUNTER — Encounter (HOSPITAL_COMMUNITY): Payer: Self-pay | Admitting: Emergency Medicine

## 2012-08-09 ENCOUNTER — Emergency Department (HOSPITAL_COMMUNITY): Payer: Medicaid Other

## 2012-08-09 DIAGNOSIS — Z8739 Personal history of other diseases of the musculoskeletal system and connective tissue: Secondary | ICD-10-CM | POA: Insufficient documentation

## 2012-08-09 DIAGNOSIS — Z79899 Other long term (current) drug therapy: Secondary | ICD-10-CM | POA: Insufficient documentation

## 2012-08-09 DIAGNOSIS — F17200 Nicotine dependence, unspecified, uncomplicated: Secondary | ICD-10-CM | POA: Insufficient documentation

## 2012-08-09 DIAGNOSIS — F411 Generalized anxiety disorder: Secondary | ICD-10-CM | POA: Insufficient documentation

## 2012-08-09 DIAGNOSIS — R079 Chest pain, unspecified: Secondary | ICD-10-CM | POA: Insufficient documentation

## 2012-08-09 DIAGNOSIS — F319 Bipolar disorder, unspecified: Secondary | ICD-10-CM | POA: Insufficient documentation

## 2012-08-09 DIAGNOSIS — E119 Type 2 diabetes mellitus without complications: Secondary | ICD-10-CM | POA: Insufficient documentation

## 2012-08-09 DIAGNOSIS — Z7982 Long term (current) use of aspirin: Secondary | ICD-10-CM | POA: Insufficient documentation

## 2012-08-09 DIAGNOSIS — Z794 Long term (current) use of insulin: Secondary | ICD-10-CM | POA: Insufficient documentation

## 2012-08-09 LAB — CBC WITH DIFFERENTIAL/PLATELET
Basophils Absolute: 0 10*3/uL (ref 0.0–0.1)
Basophils Relative: 1 % (ref 0–1)
Hemoglobin: 13.6 g/dL (ref 12.0–15.0)
MCH: 33.9 pg (ref 26.0–34.0)
MCV: 94.8 fL (ref 78.0–100.0)
Monocytes Absolute: 0.3 10*3/uL (ref 0.1–1.0)
Neutro Abs: 2.6 10*3/uL (ref 1.7–7.7)
Neutrophils Relative %: 48 % (ref 43–77)
Platelets: 119 10*3/uL — ABNORMAL LOW (ref 150–400)
RBC: 4.01 MIL/uL (ref 3.87–5.11)
WBC: 5.4 10*3/uL (ref 4.0–10.5)

## 2012-08-09 LAB — COMPREHENSIVE METABOLIC PANEL
AST: 20 U/L (ref 0–37)
Albumin: 3.7 g/dL (ref 3.5–5.2)
Chloride: 106 mEq/L (ref 96–112)
Creatinine, Ser: 0.63 mg/dL (ref 0.50–1.10)
Total Bilirubin: 0.4 mg/dL (ref 0.3–1.2)

## 2012-08-09 MED ORDER — NAPROXEN 500 MG PO TABS: 500.0000 mg | ORAL_TABLET | Freq: Two times a day (BID) | ORAL | Status: DC

## 2012-08-09 MED ORDER — IBUPROFEN 800 MG PO TABS
800.0000 mg | ORAL_TABLET | Freq: Once | ORAL | Status: AC
  Administered 2012-08-09: 800 mg via ORAL
  Filled 2012-08-09: qty 1

## 2012-08-09 MED ORDER — POTASSIUM CHLORIDE CRYS ER 20 MEQ PO TBCR
40.0000 meq | EXTENDED_RELEASE_TABLET | Freq: Once | ORAL | Status: AC
  Administered 2012-08-09: 40 meq via ORAL
  Filled 2012-08-09: qty 2

## 2012-08-09 NOTE — ED Notes (Signed)
Patient has a glucometer here that has been here since a visit in December.  Notes on glucometer indicate we have attempted to notify her of same.  Item return to patient now.

## 2012-08-09 NOTE — ED Provider Notes (Signed)
History     CSN: [id number]  Arrival date & time 08/09/12  2013   First MD Initiated Contact with Patient 08/09/12 2023      Chief Complaint  Patient presents with  . Chest Pain    (Consider location/radiation/quality/duration/timing/severity/associated sxs/prior treatment) HPI....inferior sternal chest pain described as twitching sense 5 PM. No associated dyspnea, nausea, diaphoresis. Palpation makes symptoms worse. Severity is mild. No radiation. No previous heart problems  Past Medical History  Diagnosis Date  . Bipolar 1 disorder     pt denies, says was misdiagnosed  . Diabetes mellitus   . Fibromyalgia   . Anxiety   . Panic attack     Past Surgical History  Procedure Date  . Mandible surgery   . Cholecystectomy     Family History  Problem Relation Age of Onset  . Heart failure Mother   . Heart failure Other     History  Substance Use Topics  . Smoking status: Current Every Day Smoker -- 1.0 packs/day for 30 years    Types: Cigarettes  . Smokeless tobacco: Not on file  . Alcohol Use: No    OB History    Grav Para Term Preterm Abortions TAB SAB Ect Mult Living   4    2  2          Review of Systems  All other systems reviewed and are negative.    Allergies  Procaine hcl; Aripiprazole; Cogentin; and Haldol  Home Medications   Current Outpatient Rx  Name  Route  Sig  Dispense  Refill  . ALPRAZOLAM 1 MG PO TABS   Oral   Take 1 mg by mouth daily as needed. Anxiety         . ASPIRIN 325 MG PO TABS   Oral   Take 650 mg by mouth once. For chest pain         . INSULIN GLARGINE 100 UNIT/ML [place] SOLN   Subcutaneous   Inject 25 Units into the skin at bedtime. For diabetes.   10 mL      . ADULT MULTIVITAMIN W/MINERALS CH   Oral   Take 1 tablet by mouth daily.           BP 147/87  Pulse 88  Temp 97.8 F (36.6 C) (Oral)  Resp 20  Ht 5\' 1"  (1.549 m)  Wt 200 lb (90.719 kg)  BMI 37.79 kg/m2  SpO2 100%  Physical Exam  Nursing note  and vitals reviewed. Constitutional: She is oriented to person, place, and time. She appears well-developed and well-nourished.  HENT:  Head: Normocephalic and atraumatic.  Eyes: Conjunctivae normal and EOM are normal. Pupils are equal, round, and reactive to light.  Neck: Normal range of motion. Neck supple.  Cardiovascular: Normal rate, regular rhythm and normal heart sounds.   Pulmonary/Chest: Effort normal and breath sounds normal.       Tender over inferior sternum  Abdominal: Soft. Bowel sounds are normal.  Musculoskeletal: Normal range of motion.  Neurological: She is alert and oriented to person, place, and time.  Skin: Skin is warm and dry.  Psychiatric: She has a normal mood and affect.    ED Course  Procedures (including critical care time)  Labs Reviewed  CBC WITH DIFFERENTIAL - Abnormal; Notable for the following:    Platelets 119 (*)     All other components within normal limits  COMPREHENSIVE METABOLIC PANEL - Abnormal; Notable for the following:    Potassium 3.1 (*)  BUN 5 (*)     All other components within normal limits  TROPONIN I    Dg Chest 2 View  08/09/2012  *RADIOLOGY REPORT*  Clinical Data: Chest pain  CHEST - 2 VIEW  Comparison: 07/30/2012  Findings: Stable mild cardiomegaly.  Vascular clips in the upper abdomen.  Lungs clear.  No effusion.  Regional bones unremarkable.  IMPRESSION:  1.  Stable mild cardiomegaly   Original Report Authenticated By: D. Andria Rhein, MD    No results found.   No diagnosis found.   Date: 08/09/2012  Rate: 66  Rhythm: normal sinus rhythm  QRS Axis: normal  Intervals: normal  ST/T Wave abnormalities: normal  Conduction Disutrbances: none  Narrative Interpretation: unremarkable c SA    MDM  Screening tests including EKG, chest x-ray, troponin, glucose normal.  Charge home with Naprosyn 500 mg #20       Donnetta Hutching, MD 08/09/12 2203

## 2012-08-09 NOTE — ED Notes (Signed)
Onset midsternal chest pain "twitching" 5pm

## 2012-08-09 NOTE — ED Notes (Signed)
Note:  Patient states she took two asa and a "nerve" pill that she got from a neighbor.  States it is a generic Xanax.  Took it about one hour before she came to ED.  States she still is "shaking".

## 2012-08-09 NOTE — ED Notes (Signed)
Patient c/o nausea and " feels like she will vomit".  Requested bedpan  States she is too dizzy to walk to bathroom.   Medication with held at present due to "nausea"

## 2012-08-19 ENCOUNTER — Emergency Department (HOSPITAL_COMMUNITY)
Admission: EM | Admit: 2012-08-19 | Discharge: 2012-08-20 | Disposition: A | Discharge status: Home / Self Care | Payer: Medicaid Other | Attending: Emergency Medicine | Admitting: Emergency Medicine

## 2012-08-19 ENCOUNTER — Encounter (HOSPITAL_COMMUNITY): Payer: Self-pay

## 2012-08-19 DIAGNOSIS — E119 Type 2 diabetes mellitus without complications: Secondary | ICD-10-CM | POA: Insufficient documentation

## 2012-08-19 DIAGNOSIS — Z79899 Other long term (current) drug therapy: Secondary | ICD-10-CM | POA: Insufficient documentation

## 2012-08-19 DIAGNOSIS — F29 Unspecified psychosis not due to a substance or known physiological condition: Secondary | ICD-10-CM | POA: Insufficient documentation

## 2012-08-19 DIAGNOSIS — R071 Chest pain on breathing: Secondary | ICD-10-CM | POA: Insufficient documentation

## 2012-08-19 DIAGNOSIS — Z794 Long term (current) use of insulin: Secondary | ICD-10-CM | POA: Insufficient documentation

## 2012-08-19 DIAGNOSIS — F1210 Cannabis abuse, uncomplicated: Secondary | ICD-10-CM | POA: Insufficient documentation

## 2012-08-19 DIAGNOSIS — F17200 Nicotine dependence, unspecified, uncomplicated: Secondary | ICD-10-CM | POA: Insufficient documentation

## 2012-08-19 DIAGNOSIS — Z7982 Long term (current) use of aspirin: Secondary | ICD-10-CM | POA: Insufficient documentation

## 2012-08-19 DIAGNOSIS — Z8659 Personal history of other mental and behavioral disorders: Secondary | ICD-10-CM | POA: Insufficient documentation

## 2012-08-19 DIAGNOSIS — F410 Panic disorder [episodic paroxysmal anxiety] without agoraphobia: Secondary | ICD-10-CM | POA: Insufficient documentation

## 2012-08-19 DIAGNOSIS — Z8739 Personal history of other diseases of the musculoskeletal system and connective tissue: Secondary | ICD-10-CM | POA: Insufficient documentation

## 2012-08-19 LAB — TROPONIN I: Troponin I: <0.3 ng/mL (ref <0.30)

## 2012-08-19 MED ORDER — IBUPROFEN 400 MG PO TABS: 400.0000 mg | ORAL_TABLET | Freq: Four times a day (QID) | ORAL | Status: DC | PRN

## 2012-08-19 MED ORDER — ALPRAZOLAM 0.5 MG PO TABS
0.5000 mg | ORAL_TABLET | Freq: Once | ORAL | Status: AC
  Administered 2012-08-19: 0.5 mg via ORAL
  Filled 2012-08-19: qty 1

## 2012-08-19 MED ORDER — KETOROLAC TROMETHAMINE 30 MG/ML IJ SOLN
30.0000 mg | Freq: Once | INTRAMUSCULAR | Status: AC
  Administered 2012-08-19: 30 mg via INTRAMUSCULAR
  Filled 2012-08-19: qty 1

## 2012-08-19 NOTE — ED Notes (Signed)
Pt now hysterical, clawing at her face. States the pain is making her delirious. Crying, rambling about not being able to get care for her fibromyalgia. ERMD aware and in to re-evaluate pt

## 2012-08-19 NOTE — ED Notes (Signed)
Mid sternal chest pain for several days, seen here for same 2 days ago.

## 2012-08-19 NOTE — ED Provider Notes (Addendum)
History     CSN: [id number]  Arrival date & time 08/19/12  [name]   First MD Initiated Contact with Patient 08/19/12 2014      Chief Complaint  Patient presents with  . Chest Pain    (Consider location/radiation/quality/duration/timing/severity/associated sxs/prior treatment) HPI Comments: The patient comes in with cc of chest pain. Hx of iddm, hx of anxiety. States that she started having chest pain this am - it is right sided, worse with palpation in that area and with moving. She has typical pain with her anxiety.   Patient is a 47 y.o. female presenting with chest pain. The history is provided by the patient.  Chest Pain Pertinent negatives for primary symptoms include no shortness of breath, no abdominal pain, no nausea and no vomiting.     Past Medical History  Diagnosis Date  . Bipolar 1 disorder     pt denies, says was misdiagnosed  . Diabetes mellitus   . Fibromyalgia   . Anxiety   . Panic attack     Past Surgical History  Procedure Date  . Mandible surgery   . Cholecystectomy     Family History  Problem Relation Age of Onset  . Heart failure Mother   . Heart failure Other     History  Substance Use Topics  . Smoking status: Current Every Day Smoker -- 1.0 packs/day for 30 years    Types: Cigarettes  . Smokeless tobacco: Not on file  . Alcohol Use: No    OB History    Grav Para Term Preterm Abortions TAB SAB Ect Mult Living   4    2  2          Review of Systems  Constitutional: Negative for activity change.  HENT: Negative for neck pain.   Eyes: Negative for visual disturbance.  Respiratory: Negative for shortness of breath.   Cardiovascular: Positive for chest pain.  Gastrointestinal: Negative for nausea, vomiting and abdominal pain.  Genitourinary: Negative for dysuria.  Musculoskeletal: Negative for myalgias.  Skin: Negative for rash.  Neurological: Negative for headaches.  Hematological: Does not bruise/bleed easily.    Psychiatric/Behavioral: The patient is nervous/anxious.     Allergies  Procaine hcl; Aripiprazole; Cogentin; and Haldol  Home Medications   Current Outpatient Rx  Name  Route  Sig  Dispense  Refill  . ALPRAZOLAM 1 MG PO TABS   Oral   Take 1 mg by mouth 2 (two) times daily as needed. Anxiety         . ASPIRIN 325 MG PO TABS   Oral   Take 650 mg by mouth 2 (two) times daily as needed. For chest pain         . ADULT MULTIVITAMIN W/MINERALS CH   Oral   Take 1 tablet by mouth daily.         Marland Kitchen NAPROXEN 500 MG PO TABS   Oral   Take 1 tablet (500 mg total) by mouth 2 (two) times daily.   20 tablet   0   . INSULIN GLARGINE 100 UNIT/ML [place] SOLN   Subcutaneous   Inject 25 Units into the skin at bedtime. For diabetes.   10 mL        BP 112/67  Pulse 68  Temp 98.4 F (36.9 C) (Oral)  Resp 20  Ht 5\' 1"  (1.549 m)  Wt 203 lb (92.08 kg)  BMI 38.36 kg/m2  SpO2 98%  Physical Exam  Nursing note and vitals reviewed. Constitutional: She is  oriented to person, place, and time. She appears well-developed and well-nourished.  HENT:  Head: Normocephalic and atraumatic.  Eyes: EOM are normal. Pupils are equal, round, and reactive to light.  Neck: Neck supple.  Cardiovascular: Normal rate, regular rhythm and normal heart sounds.   No murmur heard. Pulmonary/Chest: Effort normal. No respiratory distress. She exhibits tenderness.  Abdominal: Soft. She exhibits no distension. There is no tenderness. There is no rebound and no guarding.  Neurological: She is alert and oriented to person, place, and time.  Skin: Skin is warm and dry.    ED Course  Procedures (including critical care time)   Labs Reviewed  TROPONIN I   No results found.   No diagnosis found.    MDM   Date: 08/19/2012  Rate:57  Rhythm: normal sinus rhythm  QRS Axis: normal  Intervals: normal  ST/T Wave abnormalities: normal  Conduction Disutrbances: none  Narrative Interpretation:  unremarkable Pt comes in with cc of chest pain. Pt has had similar pain in the past -with anxiety.  My exam shows no jvd, normal pulse bilaterally on the radial side and clear lung exam. The pain is reproducible with palpation of the chest wall. Trop x 1 will be ordered WKG is WNL.   Derwood Kaplan, MD 08/19/12 2232  11:12 PM When discharge - patient started crying, and started saying that she has to neighborhood watch and that people are dying and being murdered in the streets and she needs to save them. She just started being tangential with her thoughts. I am not sure if she is in acute psychoses - or panic attacks - but she will need to be assessed by the behavioral health team.  Derwood Kaplan, MD 08/19/12 2324

## 2012-08-20 ENCOUNTER — Encounter (HOSPITAL_COMMUNITY): Payer: Self-pay

## 2012-08-20 LAB — BASIC METABOLIC PANEL
BUN: 7 mg/dL (ref 6–23)
CO2: 26 mEq/L (ref 19–32)
Chloride: 105 mEq/L (ref 96–112)
Creatinine, Ser: 0.65 mg/dL (ref 0.50–1.10)
Glucose, Bld: 81 mg/dL (ref 70–99)

## 2012-08-20 LAB — CBC WITH DIFFERENTIAL/PLATELET
Basophils Absolute: 0 10*3/uL (ref 0.0–0.1)
Eosinophils Relative: 2 % (ref 0–5)
HCT: 37.6 % (ref 36.0–46.0)
Lymphocytes Relative: 40 % (ref 12–46)
MCH: 33.9 pg (ref 26.0–34.0)
MCV: 97.4 fL (ref 78.0–100.0)
Monocytes Absolute: 0.2 10*3/uL (ref 0.1–1.0)
RDW: 13.1 % (ref 11.5–15.5)
WBC: 5.1 10*3/uL (ref 4.0–10.5)

## 2012-08-20 LAB — ETHANOL: Alcohol, Ethyl (B): <11 mg/dL (ref 0–11)

## 2012-08-20 LAB — RAPID URINE DRUG SCREEN, HOSP PERFORMED
Amphetamines: NOT DETECTED
Tetrahydrocannabinol: POSITIVE — AB

## 2012-08-20 MED ORDER — HYDROCODONE-ACETAMINOPHEN 5-325 MG PO TABS: 1.0000 | ORAL_TABLET | ORAL | Status: AC | PRN

## 2012-08-20 MED ORDER — HYDROCODONE-ACETAMINOPHEN 5-325 MG PO TABS
2.0000 | ORAL_TABLET | Freq: Once | ORAL | Status: AC
  Administered 2012-08-20: 2 via ORAL
  Filled 2012-08-20: qty 2

## 2012-08-20 NOTE — ED Provider Notes (Signed)
0002 Assumed care/disposition of patient.She was seen for chest wall pain she has had in the past. Negative troponin and EKG. Given toradol. Ready for discharge when she began talking about all the people dying in the world, began hyperventilating, crying. She was given xanax with some improvement. Will re-evaluate. Psych hold orders on the chart.  30 Spoke with patient. She states the pain to her mid chest is making her feel crazy. She has received a second xanax.  Patient is alert, oriented, answering questions appropriately. She is still c/o chest pain.  Ordered analgesic. 0110 Feels better. Pain has been improved with hydrocodone. Will discharge with Rx for hydrocodone. She will follow up with [facility] if needed.   Pt stable in ED with no significant deterioration in condition.The patient appears reasonably screened and/or stabilized for discharge and I doubt any other medical condition or other [facility] requiring further screening, evaluation, or treatment in the ED at this time prior to discharge.  Nicoletta Dress. Colon Branch, MD 08/20/12 0110

## 2012-08-20 NOTE — ED Notes (Signed)
Much calmer. Comfortable with plan to stay here tonight and see ACT Team in am.

## 2012-08-20 NOTE — ED Notes (Signed)
Has been re-evaluated by Dr. Colon Branch. Pt now for discharge home. Pt aware. No further outbursts. Calling now for ride home.

## 2012-09-05 ENCOUNTER — Emergency Department (HOSPITAL_COMMUNITY)
Admission: EM | Admit: 2012-09-05 | Discharge: 2012-09-05 | Disposition: A | Discharge status: Home / Self Care | Payer: Medicaid Other | Attending: Emergency Medicine | Admitting: Emergency Medicine

## 2012-09-05 ENCOUNTER — Encounter (HOSPITAL_COMMUNITY): Payer: Self-pay

## 2012-09-05 DIAGNOSIS — F319 Bipolar disorder, unspecified: Secondary | ICD-10-CM | POA: Insufficient documentation

## 2012-09-05 DIAGNOSIS — M5430 Sciatica, unspecified side: Secondary | ICD-10-CM | POA: Insufficient documentation

## 2012-09-05 DIAGNOSIS — E119 Type 2 diabetes mellitus without complications: Secondary | ICD-10-CM | POA: Insufficient documentation

## 2012-09-05 DIAGNOSIS — F410 Panic disorder [episodic paroxysmal anxiety] without agoraphobia: Secondary | ICD-10-CM | POA: Insufficient documentation

## 2012-09-05 DIAGNOSIS — M5432 Sciatica, left side: Secondary | ICD-10-CM

## 2012-09-05 DIAGNOSIS — F17200 Nicotine dependence, unspecified, uncomplicated: Secondary | ICD-10-CM | POA: Insufficient documentation

## 2012-09-05 DIAGNOSIS — Z79899 Other long term (current) drug therapy: Secondary | ICD-10-CM | POA: Insufficient documentation

## 2012-09-05 DIAGNOSIS — IMO0001 Reserved for inherently not codable concepts without codable children: Secondary | ICD-10-CM | POA: Insufficient documentation

## 2012-09-05 MED ORDER — CYCLOBENZAPRINE HCL 5 MG PO TABS: 5.0000 mg | ORAL_TABLET | Freq: Three times a day (TID) | ORAL | Status: DC | PRN

## 2012-09-05 MED ORDER — KETOROLAC TROMETHAMINE 60 MG/2ML IM SOLN
60.0000 mg | Freq: Once | INTRAMUSCULAR | Status: AC
  Administered 2012-09-05: 60 mg via INTRAMUSCULAR
  Filled 2012-09-05: qty 2

## 2012-09-05 MED ORDER — IBUPROFEN 600 MG PO TABS: 600.0000 mg | ORAL_TABLET | Freq: Four times a day (QID) | ORAL | Status: DC | PRN

## 2012-09-05 NOTE — ED Provider Notes (Signed)
Medical screening examination/treatment/procedure(s) were performed by non-physician practitioner and as supervising physician I was immediately available for consultation/collaboration.    Nelia Shi, MD 09/05/12 [phone]

## 2012-09-05 NOTE — ED Provider Notes (Signed)
History     CSN: [id number]  Arrival date & time 09/05/12  1530   First MD Initiated Contact with Patient 09/05/12 1646      Chief Complaint  Patient presents with  . Back Pain    (Consider location/radiation/quality/duration/timing/severity/associated sxs/prior treatment) HPI Comments: [name] is a 47 y.o. Female presenting with acute on chronic low back pain which has which has been present for the past 3 days.   Patient denies any new injury specifically.  There is radiation into the into her left posterior thigh and buttock area,  Reports has a history of sciatica and her current pain is similar to previous episodes.  There has been no weakness or numbness in the lower extremities and no urinary or bowel retention or incontinence.  Patient does not have a history of cancer or IVDU.      Patient is a 47 y.o. female presenting with back pain. The history is provided by the patient.  Back Pain Associated symptoms: no abdominal pain, no chest pain, no dysuria, no fever, no numbness and no weakness     Past Medical History  Diagnosis Date  . Bipolar 1 disorder     pt denies, says was misdiagnosed  . Diabetes mellitus   . Fibromyalgia   . Anxiety   . Panic attack     Past Surgical History  Procedure Laterality Date  . Mandible surgery    . Cholecystectomy      Family History  Problem Relation Age of Onset  . Heart failure Mother   . Heart failure Other     History  Substance Use Topics  . Smoking status: Current Every Day Smoker -- 1.00 packs/day for 30 years    Types: Cigarettes  . Smokeless tobacco: Not on file  . Alcohol Use: No    OB History   Grav Para Term Preterm Abortions TAB SAB Ect Mult Living   4    2  2          Review of Systems  Constitutional: Negative for fever.  Respiratory: Negative for shortness of breath.   Cardiovascular: Negative for chest pain and leg swelling.  Gastrointestinal: Negative for abdominal pain, constipation  and abdominal distention.  Genitourinary: Negative for dysuria, urgency, frequency, flank pain and difficulty urinating.  Musculoskeletal: Positive for back pain. Negative for joint swelling and gait problem.  Skin: Negative for rash.  Neurological: Negative for weakness and numbness.    Allergies  Procaine hcl; Aripiprazole; Cogentin; and Haldol  Home Medications   Current Outpatient Rx  Name  Route  Sig  Dispense  Refill  . aspirin 325 MG tablet   Oral   Take 650 mg by mouth 2 (two) times daily as needed. For chest pain         . metFORMIN (GLUCOPHAGE) 500 MG tablet   Oral   Take 500 mg by mouth daily.         . naproxen sodium (ALEVE) 220 MG tablet   Oral   Take 440 mg by mouth 2 (two) times daily with a meal. pain         . cyclobenzaprine (FLEXERIL) 5 MG tablet   Oral   Take 1 tablet (5 mg total) by mouth 3 (three) times daily as needed for muscle spasms.   15 tablet   0   . ibuprofen (ADVIL,MOTRIN) 600 MG tablet   Oral   Take 1 tablet (600 mg total) by mouth every 6 (six) hours as  needed for pain.   30 tablet   0     BP 163/84  Pulse 98  Temp(Src) 97.5 F (36.4 C) (Oral)  Resp 20  Ht 5\' 1"  (1.549 m)  Wt 210 lb (95.255 kg)  BMI 39.7 kg/m2  SpO2 97%  Physical Exam  Nursing note and vitals reviewed. Constitutional: She appears well-developed and well-nourished.  HENT:  Head: Normocephalic.  Eyes: Conjunctivae are normal.  Neck: Normal range of motion. Neck supple.  Cardiovascular: Normal rate and intact distal pulses.   Pedal pulses normal.  Pulmonary/Chest: Effort normal.  Abdominal: Soft. Bowel sounds are normal. She exhibits no distension and no mass.  Musculoskeletal: Normal range of motion. She exhibits no edema.       Lumbar back: She exhibits tenderness. She exhibits no bony tenderness, no swelling, no edema, no spasm and normal pulse.  Left parlumbar ttp,  Left SI joint tenderness.  Neurological: She is alert. She has normal strength.  She displays no atrophy and no tremor. No sensory deficit. Gait normal.  Reflex Scores:      Patellar reflexes are 2+ on the right side and 2+ on the left side.      Achilles reflexes are 2+ on the right side and 2+ on the left side. No strength deficit noted in hip and knee flexor and extensor muscle groups.  Ankle flexion and extension intact.  Skin: Skin is warm and dry.  Psychiatric: She has a normal mood and affect.    ED Course  Procedures (including critical care time)  Labs Reviewed - No data to display No results found.   1. Sciatica of left side       MDM  Pt had recent xrays 9/13 of LS spine,  Reviewed,  Not repeated today given no new injury.  No neuro deficit on exam or by history to suggest emergent or surgical presentation.  Also discussed worsened sx that should prompt immediate re-evaluation including distal weakness, bowel/bladder retention/incontinence. Pt prescribed ibuprofen,  Flexeril,  Recommended heating pad,  Avoid lifting, recheck by pcp in 1 week if not improved.        Burgess Amor, [place] 09/05/12 1712

## 2012-09-05 NOTE — ED Notes (Signed)
Patient given bedside commode per request. Patient signed discharge papers-requesting to lay in room for "a little while longer."

## 2012-09-05 NOTE — ED Notes (Signed)
Pt reports severe back pain for several days, drove self to er, barely able to walk when called for triage. Was witnessed by staff walking in from parking lot unaided and w/ normal gait.

## 2012-09-17 ENCOUNTER — Emergency Department (HOSPITAL_COMMUNITY)
Admission: EM | Admit: 2012-09-17 | Discharge: 2012-09-17 | Disposition: A | Discharge status: Home / Self Care | Payer: 59 | Attending: Emergency Medicine | Admitting: Emergency Medicine

## 2012-09-17 ENCOUNTER — Encounter (HOSPITAL_COMMUNITY): Payer: Self-pay | Admitting: *Deleted

## 2012-09-17 DIAGNOSIS — Z7982 Long term (current) use of aspirin: Secondary | ICD-10-CM | POA: Insufficient documentation

## 2012-09-17 DIAGNOSIS — E119 Type 2 diabetes mellitus without complications: Secondary | ICD-10-CM | POA: Insufficient documentation

## 2012-09-17 DIAGNOSIS — F17200 Nicotine dependence, unspecified, uncomplicated: Secondary | ICD-10-CM | POA: Insufficient documentation

## 2012-09-17 DIAGNOSIS — R079 Chest pain, unspecified: Secondary | ICD-10-CM | POA: Insufficient documentation

## 2012-09-17 DIAGNOSIS — F410 Panic disorder [episodic paroxysmal anxiety] without agoraphobia: Secondary | ICD-10-CM | POA: Insufficient documentation

## 2012-09-17 DIAGNOSIS — Z8659 Personal history of other mental and behavioral disorders: Secondary | ICD-10-CM | POA: Insufficient documentation

## 2012-09-17 DIAGNOSIS — G8929 Other chronic pain: Secondary | ICD-10-CM | POA: Insufficient documentation

## 2012-09-17 DIAGNOSIS — Z79899 Other long term (current) drug therapy: Secondary | ICD-10-CM | POA: Insufficient documentation

## 2012-09-17 MED ORDER — ALPRAZOLAM 0.5 MG PO TABS
1.0000 mg | ORAL_TABLET | Freq: Once | ORAL | Status: AC
  Administered 2012-09-17: 1 mg via ORAL
  Filled 2012-09-17: qty 2

## 2012-09-17 NOTE — ED Notes (Addendum)
Pt to department via EMS.  Reports "I've been having anxiety attacks all day, with chest pain."  Reports taking a neighbor's Xanax last night with no relief.  Pt reports being out of her own medication. Denies ETOH, reports smoking marijuana tonight to "try to calm down".

## 2012-09-17 NOTE — ED Notes (Signed)
Per EMS: [facility]. Pt reported respiratory distress and chest pain. After answering door, Pt was upset and began hyperventilating on the porch.  EMS was able to get pt to calm her breathing.

## 2012-09-17 NOTE — ED Notes (Addendum)
Pt yelling and screaming that she is "freaking out and needs her medication."  Explained to pt that it was ordered a matter of minutes ago and staff is getting it as fast as possible.  Pt also angry that physician already told her she would not be receiving narcotic pain medications as requested. Reinforced with pt to relax, slow her breathing, and to calm down, as she is increasing her pain level by yelling, screaming, and thrashing on the bed.

## 2012-09-17 NOTE — ED Provider Notes (Signed)
History     CSN: [id number]  Arrival date & time 09/17/12  0445   First MD Initiated Contact with Patient 09/17/12 [phone]      Chief Complaint  Patient presents with  . Panic Attack    (Consider location/radiation/quality/duration/timing/severity/associated sxs/prior treatment) HPI [name] is a 47 y.o. female brought in by ambulance, who presents to the Emergency Department complaining of panic attacks and chest pains.. She has had several panic attacks today requiring she take a medicine belonging to a friend. She has smoked marijuana tonight to help her calm down and help with her breathing and chest pains.  Past Medical History  Diagnosis Date  . Bipolar 1 disorder     pt denies, says was misdiagnosed  . Diabetes mellitus   . Fibromyalgia   . Anxiety   . Panic attack     Past Surgical History  Procedure Laterality Date  . Mandible surgery    . Cholecystectomy      Family History  Problem Relation Age of Onset  . Heart failure Mother   . Heart failure Other     History  Substance Use Topics  . Smoking status: Current Every Day Smoker -- 1.00 packs/day for 30 years    Types: Cigarettes  . Smokeless tobacco: Not on file  . Alcohol Use: No    OB History   Grav Para Term Preterm Abortions TAB SAB Ect Mult Living   4    2  2          Review of Systems  Constitutional: Negative for fever.       10 Systems reviewed and are negative for acute change except as noted in the HPI.  HENT: Negative for congestion.   Eyes: Negative for discharge and redness.  Respiratory: Negative for cough and shortness of breath.   Cardiovascular: Positive for chest pain.  Gastrointestinal: Negative for vomiting and abdominal pain.  Musculoskeletal: Negative for back pain.  Skin: Negative for rash.  Neurological: Negative for syncope, numbness and headaches.  Psychiatric/Behavioral:       No behavior change.    Allergies  Procaine hcl; Aripiprazole; Cogentin; and  Haldol  Home Medications   Current Outpatient Rx  Name  Route  Sig  Dispense  Refill  . aspirin 325 MG tablet   Oral   Take 650 mg by mouth 2 (two) times daily as needed. For chest pain         . cyclobenzaprine (FLEXERIL) 5 MG tablet   Oral   Take 1 tablet (5 mg total) by mouth 3 (three) times daily as needed for muscle spasms.   15 tablet   0   . ibuprofen (ADVIL,MOTRIN) 600 MG tablet   Oral   Take 1 tablet (600 mg total) by mouth every 6 (six) hours as needed for pain.   30 tablet   0   . metFORMIN (GLUCOPHAGE) 500 MG tablet   Oral   Take 500 mg by mouth daily.         . naproxen sodium (ALEVE) 220 MG tablet   Oral   Take 440 mg by mouth 2 (two) times daily with a meal. pain           BP 141/81  Pulse 89  Temp(Src) 98.1 F (36.7 C) (Oral)  Resp 20  Ht 5\' 1"  (1.549 m)  Wt 210 lb (95.255 kg)  BMI 39.7 kg/m2  SpO2 98%  Physical Exam  Constitutional: She is oriented to person, place,  and time. She appears well-developed and well-nourished.  HENT:  Head: Normocephalic.  Right Ear: External ear normal.  Left Ear: External ear normal.  Mouth/Throat: Oropharynx is clear and moist.  Eyes: Conjunctivae and EOM are normal. Pupils are equal, round, and reactive to light.  Neck: Normal range of motion.  Cardiovascular: Normal rate and normal heart sounds.   Pulmonary/Chest: Breath sounds normal.  Abdominal: Bowel sounds are normal.  Musculoskeletal: Normal range of motion.  Neurological: She is alert and oriented to person, place, and time. She has normal reflexes.  Skin: Skin is dry.    ED Course  Procedures (including critical care time)    0515 Patient began screaming "Someone needs to help me now". When asked what her trouble was she stated she was to get her medicine right now.  MDM  Patient with a h/o anxiety attacks and chronic pain here with both. Given xanax 1 mg tablet.Pt stable in ED with no significant deterioration in condition.The patient  appears reasonably screened and/or stabilized for discharge and I doubt any other medical condition or other [facility] requiring further screening, evaluation, or treatment in the ED at this time prior to discharge.  MDM Reviewed: nursing note and vitals           Nicoletta Dress. Colon Branch, MD 09/17/12 [phone]

## 2012-09-22 ENCOUNTER — Emergency Department (HOSPITAL_COMMUNITY): Payer: Medicaid Other

## 2012-09-22 ENCOUNTER — Emergency Department (HOSPITAL_COMMUNITY)
Admission: EM | Admit: 2012-09-22 | Discharge: 2012-09-22 | Disposition: A | Discharge status: Home / Self Care | Payer: Medicaid Other | Attending: Emergency Medicine | Admitting: Emergency Medicine

## 2012-09-22 ENCOUNTER — Encounter (HOSPITAL_COMMUNITY): Payer: Self-pay

## 2012-09-22 DIAGNOSIS — Z79899 Other long term (current) drug therapy: Secondary | ICD-10-CM | POA: Insufficient documentation

## 2012-09-22 DIAGNOSIS — R079 Chest pain, unspecified: Secondary | ICD-10-CM

## 2012-09-22 DIAGNOSIS — IMO0001 Reserved for inherently not codable concepts without codable children: Secondary | ICD-10-CM | POA: Insufficient documentation

## 2012-09-22 DIAGNOSIS — F17200 Nicotine dependence, unspecified, uncomplicated: Secondary | ICD-10-CM | POA: Insufficient documentation

## 2012-09-22 DIAGNOSIS — Z791 Long term (current) use of non-steroidal anti-inflammatories (NSAID): Secondary | ICD-10-CM | POA: Insufficient documentation

## 2012-09-22 DIAGNOSIS — E876 Hypokalemia: Secondary | ICD-10-CM | POA: Insufficient documentation

## 2012-09-22 DIAGNOSIS — E119 Type 2 diabetes mellitus without complications: Secondary | ICD-10-CM | POA: Insufficient documentation

## 2012-09-22 DIAGNOSIS — Z8659 Personal history of other mental and behavioral disorders: Secondary | ICD-10-CM | POA: Insufficient documentation

## 2012-09-22 LAB — CBC WITH DIFFERENTIAL/PLATELET
Lymphocytes Relative: 26 % (ref 12–46)
Lymphs Abs: 1.9 10*3/uL (ref 0.7–4.0)
Neutro Abs: 4.8 10*3/uL (ref 1.7–7.7)
Neutrophils Relative %: 68 % (ref 43–77)
Platelets: 136 10*3/uL — ABNORMAL LOW (ref 150–400)
RBC: 4.16 MIL/uL (ref 3.87–5.11)
WBC: 7 10*3/uL (ref 4.0–10.5)

## 2012-09-22 LAB — BASIC METABOLIC PANEL
CO2: 25 mEq/L (ref 19–32)
Calcium: 9.5 mg/dL (ref 8.4–10.5)
GFR calc non Af Amer: >90 mL/min (ref >90)
Sodium: 141 mEq/L (ref 135–145)

## 2012-09-22 LAB — TROPONIN I: Troponin I: <0.3 ng/mL (ref <0.30)

## 2012-09-22 MED ORDER — POTASSIUM CHLORIDE CRYS ER 20 MEQ PO TBCR: 20.0000 meq | EXTENDED_RELEASE_TABLET | Freq: Every day | ORAL | Status: DC

## 2012-09-22 MED ORDER — POTASSIUM CHLORIDE CRYS ER 20 MEQ PO TBCR
40.0000 meq | EXTENDED_RELEASE_TABLET | Freq: Once | ORAL | Status: AC
  Administered 2012-09-22: 40 meq via ORAL
  Filled 2012-09-22: qty 2

## 2012-09-22 MED ORDER — OXYCODONE-ACETAMINOPHEN 5-325 MG PO TABS: 2.0000 | ORAL_TABLET | ORAL | Status: DC | PRN
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
## 2012-09-25 NOTE — ED Provider Notes (Signed)
History     CSN: [id number]  Arrival date & time 09/22/12  1552   First MD Initiated Contact with Patient 09/22/12 1628      Chief Complaint  Patient presents with  . Stress    (Consider location/radiation/quality/duration/timing/severity/associated sxs/prior treatment) HPI.....  right sided chest pain for 3 days.   No dyspnea, diaphoresis, nausea. Nothing makes symptoms better or worse. Severity is mild to moderate. No radiation. Description of pain is vague. Under lots of stress lately. Past Medical History  Diagnosis Date  . Bipolar 1 disorder     pt denies, says was misdiagnosed  . Diabetes mellitus   . Fibromyalgia   . Anxiety   . Panic attack     Past Surgical History  Procedure Laterality Date  . Mandible surgery    . Cholecystectomy      Family History  Problem Relation Age of Onset  . Heart failure Mother   . Heart failure Other     History  Substance Use Topics  . Smoking status: Current Every Day Smoker -- 1.00 packs/day for 30 years    Types: Cigarettes  . Smokeless tobacco: Not on file  . Alcohol Use: No    OB History   Grav Para Term Preterm Abortions TAB SAB Ect Mult Living   4    2  2          Review of Systems  All other systems reviewed and are negative.    Allergies  Procaine hcl; Aripiprazole; Cogentin; and Haldol  Home Medications   Current Outpatient Rx  Name  Route  Sig  Dispense  Refill  . metFORMIN (GLUCOPHAGE) 500 MG tablet   Oral   Take 500 mg by mouth daily.         . naproxen sodium (ALEVE) 220 MG tablet   Oral   Take 440 mg by mouth 2 (two) times daily with a meal. pain         . oxyCODONE-acetaminophen (PERCOCET) 5-325 MG per tablet   Oral   Take 2 tablets by mouth every 4 (four) hours as needed for pain.   10 tablet   0   . potassium chloride SA (K-DUR,KLOR-CON) 20 MEQ tablet   Oral   Take 1 tablet (20 mEq total) by mouth daily.   10 tablet   0     BP 140/78  Pulse 93  Temp(Src) 97.9 F (36.6  C) (Oral)  Resp 18  Ht 5\' 1"  (1.549 m)  Wt 198 lb (89.812 kg)  BMI 37.43 kg/m2  SpO2 100%  LMP 09/23/2011  Physical Exam  Nursing note and vitals reviewed. Constitutional: She is oriented to person, place, and time. She appears well-developed and well-nourished.  HENT:  Head: Normocephalic and atraumatic.  Eyes: Conjunctivae and EOM are normal. Pupils are equal, round, and reactive to light.  Neck: Normal range of motion. Neck supple.  Cardiovascular: Normal rate, regular rhythm and normal heart sounds.   Pulmonary/Chest: Effort normal and breath sounds normal.  Abdominal: Soft. Bowel sounds are normal.  Musculoskeletal: Normal range of motion.  Neurological: She is alert and oriented to person, place, and time.  Skin: Skin is warm and dry.  Psychiatric: She has a normal mood and affect.    ED Course  Procedures (including critical care time)  Labs Reviewed  BASIC METABOLIC PANEL - Abnormal; Notable for the following:    Potassium 3.0 (*)    BUN 3 (*)    All other components within normal  limits  CBC WITH DIFFERENTIAL - Abnormal; Notable for the following:    Platelets 136 (*)    All other components within normal limits  TROPONIN I   No results found.   1. Chest pain   2. Hypokalemia     Date: 09/25/2012  Rate: 68  Rhythm: normal sinus rhythm  QRS Axis: normal  Intervals: normal  ST/T Wave abnormalities: normal  Conduction Disutrbances:none  Narrative Interpretation:   Old EKG Reviewed: none available c SA   MDM  History is vague. Troponin negative. EKG shows no acute changes.patient has primary care followup         Donnetta Hutching, MD 09/25/12 2320

## 2012-10-06 ENCOUNTER — Emergency Department (HOSPITAL_COMMUNITY)
Admission: EM | Admit: 2012-10-06 | Discharge: 2012-10-06 | Disposition: A | Discharge status: Home / Self Care | Payer: Medicaid Other | Attending: Emergency Medicine | Admitting: Emergency Medicine

## 2012-10-06 ENCOUNTER — Encounter (HOSPITAL_COMMUNITY): Payer: Self-pay

## 2012-10-06 DIAGNOSIS — Z8739 Personal history of other diseases of the musculoskeletal system and connective tissue: Secondary | ICD-10-CM | POA: Insufficient documentation

## 2012-10-06 DIAGNOSIS — Z79899 Other long term (current) drug therapy: Secondary | ICD-10-CM | POA: Insufficient documentation

## 2012-10-06 DIAGNOSIS — R05 Cough: Secondary | ICD-10-CM | POA: Insufficient documentation

## 2012-10-06 DIAGNOSIS — J00 Acute nasopharyngitis [common cold]: Secondary | ICD-10-CM | POA: Insufficient documentation

## 2012-10-06 DIAGNOSIS — E669 Obesity, unspecified: Secondary | ICD-10-CM | POA: Insufficient documentation

## 2012-10-06 DIAGNOSIS — J069 Acute upper respiratory infection, unspecified: Secondary | ICD-10-CM | POA: Insufficient documentation

## 2012-10-06 DIAGNOSIS — F411 Generalized anxiety disorder: Secondary | ICD-10-CM | POA: Insufficient documentation

## 2012-10-06 DIAGNOSIS — R5381 Other malaise: Secondary | ICD-10-CM | POA: Insufficient documentation

## 2012-10-06 DIAGNOSIS — R079 Chest pain, unspecified: Secondary | ICD-10-CM

## 2012-10-06 DIAGNOSIS — E119 Type 2 diabetes mellitus without complications: Secondary | ICD-10-CM | POA: Insufficient documentation

## 2012-10-06 DIAGNOSIS — R071 Chest pain on breathing: Secondary | ICD-10-CM | POA: Insufficient documentation

## 2012-10-06 DIAGNOSIS — F17200 Nicotine dependence, unspecified, uncomplicated: Secondary | ICD-10-CM | POA: Insufficient documentation

## 2012-10-06 DIAGNOSIS — R059 Cough, unspecified: Secondary | ICD-10-CM | POA: Insufficient documentation

## 2012-10-06 DIAGNOSIS — J3489 Other specified disorders of nose and nasal sinuses: Secondary | ICD-10-CM | POA: Insufficient documentation

## 2012-10-06 NOTE — ED Provider Notes (Signed)
Medical screening examination/treatment/procedure(s) were conducted as a shared visit with non-physician practitioner(s) and myself.  I personally evaluated the patient during the encounter  EKG reviewed and unchanged I doubt ACS/PE/Dissection Her pain is clearly reproducible I advised against taking medications that are not prescribed for her I advised need for PCP and outpatient psychiatrist management given high frequency of ED visits She is in no distress, and stable for d/c  Joya Gaskins, MD 10/06/12 1051

## 2012-10-06 NOTE — ED Notes (Signed)
Pt states, " I have chest wall pain."  Also c/o anxiety and cold symptoms.  Reports has had symptoms for past few days.  Has been taking tylenol prn for fever.  Last took tylenol last night around 2230.  Pt also reports has been out of all of her medication x 2 days.

## 2012-10-06 NOTE — ED Provider Notes (Signed)
History     CSN: [id number]  Arrival date & time 10/06/12  0910   First MD Initiated Contact with Patient 10/06/12 [phone]      Chief Complaint  Patient presents with  . Chest Pain  . URI  . Anxiety    (Consider location/radiation/quality/duration/timing/severity/associated sxs/prior treatment) Patient is a 47 y.o. female presenting with chest pain, URI, and anxiety. The history is provided by the patient. No language interpreter was used.  Chest Pain Pain location:  Substernal area (claims it is chest wall pain, pointing directly over sternum) Pain quality: aching and stabbing   Pain radiates to:  Does not radiate Pain radiates to the back: no   Pain severity:  Severe Onset quality:  Gradual Timing:  Constant Progression:  Unchanged Chronicity:  Recurrent Context: breathing, at rest and stress   Relieved by:  Nothing Ineffective treatments:  None tried Associated symptoms: anxiety and cough   Associated symptoms: no abdominal pain, no diaphoresis, no fever, no nausea, no orthopnea, no palpitations and not vomiting   URI Presenting symptoms: congestion and cough   Presenting symptoms: no fever   Anxiety Associated symptoms include chest pain, congestion and coughing. Pertinent negatives include no abdominal pain, diaphoresis, fever, nausea or vomiting.  Pt is a 47yo female who has been seen in the ED 6 times since 07/30/12, mainly for chest pain and anxiety.  Today she is presenting with substernal chest pain and a "viral cold."   She has been taking Tylenol sinus for cold.  States her chest pain started last night.  She normally takes Xanax from neighbor but has not had any for a few days.  She describes her chest pain as chest wall pain over her sternum, does not radiate.  Exacerbated by deep inhalation and exhalation.  Pain at rest.  She has experienced similar pain in the past.  She has not tried anything today for pain.  Pt appeared lethargic during HPI but denied taking  anything besides Tylenol last night.  Denies fever, nausea, or vomiting. Past Medical History  Diagnosis Date  . Bipolar 1 disorder     pt denies, says was misdiagnosed  . Diabetes mellitus   . Fibromyalgia   . Anxiety   . Panic attack     Past Surgical History  Procedure Laterality Date  . Mandible surgery    . Cholecystectomy      Family History  Problem Relation Age of Onset  . Heart failure Mother   . Heart failure Other     History  Substance Use Topics  . Smoking status: Current Every Day Smoker -- 1.00 packs/day for 30 years    Types: Cigarettes  . Smokeless tobacco: Not on file  . Alcohol Use: No    OB History   Grav Para Term Preterm Abortions TAB SAB Ect Mult Living   4    2  2          Review of Systems  Constitutional: Negative for fever and diaphoresis.  HENT: Positive for congestion.   Respiratory: Positive for cough.   Cardiovascular: Positive for chest pain. Negative for palpitations and orthopnea.  Gastrointestinal: Negative for nausea, vomiting and abdominal pain.  All other systems reviewed and are negative.    Allergies  Procaine hcl; Aripiprazole; Cogentin; and Haldol  Home Medications   Current Outpatient Rx  Name  Route  Sig  Dispense  Refill  . metFORMIN (GLUCOPHAGE) 500 MG tablet   Oral   Take 500 mg by mouth  daily.         . oxyCODONE-acetaminophen (PERCOCET) 5-325 MG per tablet   Oral   Take 2 tablets by mouth every 4 (four) hours as needed for pain.   10 tablet   0   . potassium chloride SA (K-DUR,KLOR-CON) 20 MEQ tablet   Oral   Take 1 tablet (20 mEq total) by mouth daily.   10 tablet   0     BP 116/61  Pulse 110  Temp(Src) 98.1 F (36.7 C) (Oral)  Resp 18  Ht 5\' 1"  (1.549 m)  Wt 198 lb (89.812 kg)  BMI 37.43 kg/m2  SpO2 100%  LMP 09/23/2011  Physical Exam  Nursing note and vitals reviewed. Constitutional: She is oriented to person, place, and time. She appears well-developed and well-nourished. No  distress.  Obese female, laying flat in exam bed without orthopnea, appears lethargic  HENT:  Head: Normocephalic and atraumatic.  Eyes: EOM are normal. Pupils are equal, round, and reactive to light.  Neck: Normal range of motion. Neck supple.  Cardiovascular: Regular rhythm and normal heart sounds.   tachycardia  Pulmonary/Chest: Effort normal and breath sounds normal. She exhibits tenderness.  Pt experienced severe pain over sternum with light palpation of my hand, however did not react to pressure applied in same spot when listening with stethoscope.   Neurological: She is alert and oriented to person, place, and time.  Skin: Skin is warm and dry. She is not diaphoretic.  Psychiatric:  anxious    ED Course  Procedures (including critical care time)  Labs Reviewed - No data to display No results found.  Date: 10/06/2012  Rate:   Rhythm: sinus arrhythmia  QRS Axis: normal  Intervals: normal  ST/T Wave abnormalities: ST depressions anteriorly  Conduction Disutrbances:none  Narrative Interpretation:   Old EKG Reviewed: unchanged    1. Chest pain       MDM  Pt has been seen in ED 6 times since 07/30/12, mostly for CP and anxiety.  Today she presented with chest pain, pt described as chest wall pain.     I do not believe chest pain is cardiac in nature.  ECG relatively normal, ST depression unchanged since last ECG.  08/19/12.  Pain is reproducible, does not radiate.  Denies fever, nausea, or vomiting.  No risk factors for DVT/PE.  No indication for further workup.    Pain appears to be chronic in nature, brought on by stress.  Pt advised to establish care with a PCP and psychiatrist. May take OTC medications for pain.  Pt agreed.  Vitals: unremarkable.  Discharged in stable condition.          Junius Finner, PA-C 10/06/12 1015

## 2012-11-18 ENCOUNTER — Emergency Department (HOSPITAL_COMMUNITY)
Admission: EM | Admit: 2012-11-18 | Discharge: 2012-11-18 | Disposition: A | Discharge status: Home / Self Care | Payer: 59 | Attending: Emergency Medicine | Admitting: Emergency Medicine

## 2012-11-18 ENCOUNTER — Encounter (HOSPITAL_COMMUNITY): Payer: Self-pay

## 2012-11-18 DIAGNOSIS — R0789 Other chest pain: Secondary | ICD-10-CM

## 2012-11-18 DIAGNOSIS — F17200 Nicotine dependence, unspecified, uncomplicated: Secondary | ICD-10-CM | POA: Insufficient documentation

## 2012-11-18 DIAGNOSIS — Z79899 Other long term (current) drug therapy: Secondary | ICD-10-CM | POA: Insufficient documentation

## 2012-11-18 DIAGNOSIS — F319 Bipolar disorder, unspecified: Secondary | ICD-10-CM | POA: Insufficient documentation

## 2012-11-18 DIAGNOSIS — F419 Anxiety disorder, unspecified: Secondary | ICD-10-CM

## 2012-11-18 DIAGNOSIS — R071 Chest pain on breathing: Secondary | ICD-10-CM | POA: Insufficient documentation

## 2012-11-18 DIAGNOSIS — F411 Generalized anxiety disorder: Secondary | ICD-10-CM | POA: Insufficient documentation

## 2012-11-18 DIAGNOSIS — E119 Type 2 diabetes mellitus without complications: Secondary | ICD-10-CM | POA: Insufficient documentation

## 2012-11-18 DIAGNOSIS — Z8739 Personal history of other diseases of the musculoskeletal system and connective tissue: Secondary | ICD-10-CM | POA: Insufficient documentation

## 2012-11-18 MED ORDER — LORAZEPAM 1 MG PO TABS
1.0000 mg | ORAL_TABLET | Freq: Once | ORAL | Status: AC
  Administered 2012-11-18: 1 mg via ORAL
  Filled 2012-11-18: qty 1

## 2012-11-18 MED ORDER — IBUPROFEN 800 MG PO TABS
800.0000 mg | ORAL_TABLET | Freq: Once | ORAL | Status: AC
  Administered 2012-11-18: 800 mg via ORAL
  Filled 2012-11-18: qty 1

## 2012-11-18 NOTE — ED Notes (Signed)
Ambulatory to bathroom.
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
## 2012-11-18 NOTE — ED Notes (Signed)
Patient observed to be sitting on stretcher fanning - states she is hot

## 2012-11-18 NOTE — ED Notes (Signed)
Called to patient room.  Reports discomfort mid sternum - point tenderness, states she has a constant sharp pain at that one location.  Patient Requesting something for her pain
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
## 2013-01-13 ENCOUNTER — Encounter (HOSPITAL_COMMUNITY): Payer: Self-pay

## 2013-01-13 ENCOUNTER — Emergency Department (HOSPITAL_COMMUNITY)
Admission: EM | Admit: 2013-01-13 | Discharge: 2013-01-14 | Disposition: A | Discharge status: Other Acute Inpatient Hospital | Payer: MEDICAID | Source: Home / Self Care | Attending: Emergency Medicine | Admitting: Emergency Medicine

## 2013-01-13 DIAGNOSIS — Z79899 Other long term (current) drug therapy: Secondary | ICD-10-CM | POA: Insufficient documentation

## 2013-01-13 DIAGNOSIS — F319 Bipolar disorder, unspecified: Secondary | ICD-10-CM

## 2013-01-13 DIAGNOSIS — F17200 Nicotine dependence, unspecified, uncomplicated: Secondary | ICD-10-CM | POA: Insufficient documentation

## 2013-01-13 DIAGNOSIS — Z3202 Encounter for pregnancy test, result negative: Secondary | ICD-10-CM | POA: Insufficient documentation

## 2013-01-13 DIAGNOSIS — IMO0001 Reserved for inherently not codable concepts without codable children: Secondary | ICD-10-CM | POA: Insufficient documentation

## 2013-01-13 DIAGNOSIS — F29 Unspecified psychosis not due to a substance or known physiological condition: Secondary | ICD-10-CM

## 2013-01-13 DIAGNOSIS — IMO0002 Reserved for concepts with insufficient information to code with codable children: Secondary | ICD-10-CM | POA: Insufficient documentation

## 2013-01-13 DIAGNOSIS — E119 Type 2 diabetes mellitus without complications: Secondary | ICD-10-CM | POA: Insufficient documentation

## 2013-01-13 DIAGNOSIS — F22 Delusional disorders: Secondary | ICD-10-CM | POA: Insufficient documentation

## 2013-01-13 LAB — CBC
MCH: 33.5 pg (ref 26.0–34.0)
Platelets: 160 10*3/uL (ref 150–400)
RBC: 4.42 MIL/uL (ref 3.87–5.11)
RDW: 12.5 % (ref 11.5–15.5)

## 2013-01-13 LAB — RAPID URINE DRUG SCREEN, HOSP PERFORMED
Amphetamines: NOT DETECTED
Barbiturates: NOT DETECTED
Benzodiazepines: NOT DETECTED
Cocaine: NOT DETECTED
Opiates: NOT DETECTED
Tetrahydrocannabinol: POSITIVE — AB

## 2013-01-13 LAB — COMPREHENSIVE METABOLIC PANEL
ALT: 26 U/L (ref 0–35)
AST: 26 U/L (ref 0–37)
Albumin: 3.6 g/dL (ref 3.5–5.2)
CO2: 26 mEq/L (ref 19–32)
Calcium: 9.6 mg/dL (ref 8.4–10.5)
Creatinine, Ser: 0.57 mg/dL (ref 0.50–1.10)
GFR calc non Af Amer: >90 mL/min (ref >90)
Sodium: 137 mEq/L (ref 135–145)
Total Protein: 7.8 g/dL (ref 6.0–8.3)

## 2013-01-13 LAB — URINALYSIS, ROUTINE W REFLEX MICROSCOPIC
Bilirubin Urine: NEGATIVE
Ketones, ur: NEGATIVE mg/dL
Nitrite: NEGATIVE
Protein, ur: NEGATIVE mg/dL
Urobilinogen, UA: 0.2 mg/dL (ref 0.0–1.0)

## 2013-01-13 MED ORDER — ZOLPIDEM TARTRATE 5 MG PO TABS
5.0000 mg | ORAL_TABLET | Freq: Every evening | ORAL | Status: DC | PRN
  Filled 2013-01-13: qty 1

## 2013-01-13 MED ORDER — NICOTINE 21 MG/24HR TD PT24
21.0000 mg | MEDICATED_PATCH | Freq: Every day | TRANSDERMAL | Status: DC
  Filled 2013-01-13: qty 1

## 2013-01-13 MED ORDER — DIPHENHYDRAMINE HCL 25 MG PO CAPS
25.0000 mg | ORAL_CAPSULE | Freq: Four times a day (QID) | ORAL | Status: DC | PRN
  Administered 2013-01-14: 25 mg via ORAL
  Filled 2013-01-13 (×2): qty 1

## 2013-01-13 MED ORDER — LORAZEPAM 1 MG PO TABS
1.0000 mg | ORAL_TABLET | Freq: Three times a day (TID) | ORAL | Status: DC | PRN
  Administered 2013-01-14 (×2): 1 mg via ORAL
  Filled 2013-01-13 (×2): qty 1

## 2013-01-13 MED ORDER — ONDANSETRON HCL 4 MG PO TABS: 4.0000 mg | ORAL_TABLET | Freq: Three times a day (TID) | ORAL | Status: DC | PRN

## 2013-01-13 MED ORDER — ACETAMINOPHEN 325 MG PO TABS: 650.0000 mg | ORAL_TABLET | ORAL | Status: DC | PRN

## 2013-01-13 MED ORDER — PRENATAL MULTIVITAMIN CH
1.0000 | ORAL_TABLET | Freq: Every day | ORAL | Status: DC
  Filled 2013-01-13 (×3): qty 1

## 2013-01-13 MED ORDER — IBUPROFEN 400 MG PO TABS: 600.0000 mg | ORAL_TABLET | Freq: Three times a day (TID) | ORAL | Status: DC | PRN

## 2013-01-13 MED ORDER — METFORMIN HCL 500 MG PO TABS: 500.0000 mg | ORAL_TABLET | Freq: Every day | ORAL | Status: DC

## 2013-01-13 NOTE — ED Notes (Signed)
Pt placed under IVC by EDP per consult with ACT. Pt wanded, by security, placed in scrubs, valuables locked up.
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
## 2013-01-13 NOTE — ED Notes (Signed)
Pt denies HI/SI, speech unintelligible at times

## 2013-01-13 NOTE — ED Notes (Signed)
Pt c/o "allergic reaction" after being exposed to crack cocaine by family members

## 2013-01-14 ENCOUNTER — Encounter (HOSPITAL_COMMUNITY): Payer: Self-pay

## 2013-01-14 ENCOUNTER — Inpatient Hospital Stay (HOSPITAL_COMMUNITY)
Admission: AD | Admit: 2013-01-14 | Discharge: 2013-01-18 | DRG: 885 | Disposition: A | Discharge status: Home / Self Care | Payer: MEDICAID | Source: Intra-hospital | Attending: Psychiatry | Admitting: Psychiatry

## 2013-01-14 DIAGNOSIS — F250 Schizoaffective disorder, bipolar type: Secondary | ICD-10-CM

## 2013-01-14 DIAGNOSIS — Z9189 Other specified personal risk factors, not elsewhere classified: Secondary | ICD-10-CM

## 2013-01-14 DIAGNOSIS — J45909 Unspecified asthma, uncomplicated: Secondary | ICD-10-CM

## 2013-01-14 DIAGNOSIS — F259 Schizoaffective disorder, unspecified: Principal | ICD-10-CM | POA: Diagnosis present

## 2013-01-14 DIAGNOSIS — F319 Bipolar disorder, unspecified: Secondary | ICD-10-CM | POA: Diagnosis present

## 2013-01-14 DIAGNOSIS — IMO0001 Reserved for inherently not codable concepts without codable children: Secondary | ICD-10-CM | POA: Diagnosis present

## 2013-01-14 DIAGNOSIS — F1210 Cannabis abuse, uncomplicated: Secondary | ICD-10-CM

## 2013-01-14 DIAGNOSIS — F17200 Nicotine dependence, unspecified, uncomplicated: Secondary | ICD-10-CM

## 2013-01-14 DIAGNOSIS — E119 Type 2 diabetes mellitus without complications: Secondary | ICD-10-CM | POA: Diagnosis present

## 2013-01-14 DIAGNOSIS — Z79899 Other long term (current) drug therapy: Secondary | ICD-10-CM

## 2013-01-14 MED ORDER — MAGNESIUM HYDROXIDE 400 MG/5ML PO SUSP: 30.0000 mL | Freq: Every day | ORAL | Status: DC | PRN

## 2013-01-14 MED ORDER — ALUM & MAG HYDROXIDE-SIMETH 200-200-20 MG/5ML PO SUSP: 30.0000 mL | ORAL | Status: DC | PRN

## 2013-01-14 MED ORDER — METFORMIN HCL 500 MG PO TABS
500.0000 mg | ORAL_TABLET | Freq: Every day | ORAL | Status: DC
  Administered 2013-01-14: 500 mg via ORAL
  Filled 2013-01-14: qty 1

## 2013-01-14 MED ORDER — DIPHENHYDRAMINE HCL 25 MG PO CAPS
25.0000 mg | ORAL_CAPSULE | Freq: Every evening | ORAL | Status: DC | PRN
  Administered 2013-01-14 – 2013-01-18 (×4): 25 mg via ORAL

## 2013-01-14 MED ORDER — ACETAMINOPHEN 325 MG PO TABS
650.0000 mg | ORAL_TABLET | Freq: Four times a day (QID) | ORAL | Status: DC | PRN
  Administered 2013-01-14 – 2013-01-16 (×4): 650 mg via ORAL
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
## 2013-01-14 NOTE — ED Notes (Signed)
Husband at bedside - contact number cell phone [phone] [facility]).

## 2013-01-14 NOTE — ED Notes (Signed)
Patient awake and alert, pleasant, voices no complaints or concerns at present.

## 2013-01-14 NOTE — ED Notes (Signed)
Pt up and down out of bed, talking to herself. Pt asking for something to eat, explained to her that it was too late. Pt asked for ice chips which were given to her. Pt very controlling and manipulative. Pt told it was bed time. Pt continued to talk to herself stating noone understands me and my life. Pt c/o pain and anxiety.

## 2013-01-14 NOTE — ED Notes (Signed)
Patient pending acceptance and [facility] per [facility] with ACT team.

## 2013-01-14 NOTE — ED Notes (Signed)
Aileen Pilot, RN in to give pt Ativan.
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
## 2013-01-14 NOTE — ED Notes (Signed)
[name] with ACT team at bedside to inform the patient that she was going to be transferred to behavioral health, the patient became belligerent and stated that she was leaving, states that nothing is wrong with her and she is not mental.  States that she says things that she does not mean when she is having her "allergy attacks."  [employer] called for assistance.

## 2013-01-14 NOTE — ED Notes (Signed)
Patient asked Andrea Mollica,rn to apologize to [facility]s for being mean and rude to her and that she could not help herself

## 2013-01-14 NOTE — ED Notes (Addendum)
Resting quietly, no complaints offered. Accepted at [facility], awaiting bed assignment.

## 2013-01-14 NOTE — ED Notes (Signed)
ACT team at bedside to evaluate patient.  

## 2013-01-14 NOTE — ED Notes (Signed)
Pt appears to be having a conversation with people in the room, however denies auditory and visual hallucinations.  No further behaviors at present.

## 2013-01-14 NOTE — ED Notes (Addendum)
Pt up to restroom cutting her eyes at said nurse. Pt then stopped before shutting the bathroom door and gave said nurse an evil eye. Pt then came out of bathroom rolling her eyes at said nurse. Pt got to room and nurse went into room. Pt began yelling for said nurse to leave her room. That she wanted another nurse. Charge nurse into speak with pt and another nurse went into try to give pt ambien. Pt refused. This behavior witnessed by Aileen Pilot, RN and Lorrine Kin, SEC.

## 2013-01-14 NOTE — BH Assessment (Signed)
[facility] Assessment Progress Note      Consulted with Dahlia Byes NP re admission for this [facility] patient. She has accepted her to the 400 hall bed 401-1

## 2013-01-14 NOTE — ED Notes (Signed)
States that she feels anxious, diversion activities and shower offered.

## 2013-01-15 DIAGNOSIS — F319 Bipolar disorder, unspecified: Secondary | ICD-10-CM

## 2013-01-15 DIAGNOSIS — F259 Schizoaffective disorder, unspecified: Principal | ICD-10-CM

## 2013-01-15 MED ORDER — INSULIN ASPART 100 UNIT/ML ~~LOC~~ SOLN: 0.0000 [IU] | Freq: Three times a day (TID) | SUBCUTANEOUS | Status: DC

## 2013-01-15 MED ORDER — MENTHOL 3 MG MT LOZG: 1.0000 | LOZENGE | OROMUCOSAL | Status: DC | PRN

## 2013-01-15 MED ORDER — DIPHENHYDRAMINE HCL 25 MG PO CAPS
25.0000 mg | ORAL_CAPSULE | Freq: Once | ORAL | Status: AC
  Administered 2013-01-15: 25 mg via ORAL
  Filled 2013-01-15: qty 1

## 2013-01-15 MED ORDER — IBUPROFEN 600 MG PO TABS
600.0000 mg | ORAL_TABLET | Freq: Four times a day (QID) | ORAL | Status: DC | PRN
  Administered 2013-01-15 – 2013-01-17 (×6): 600 mg via ORAL
  Filled 2013-01-15 (×6): qty 1

## 2013-01-15 MED ORDER — LISINOPRIL 10 MG PO TABS
10.0000 mg | ORAL_TABLET | Freq: Every day | ORAL | Status: DC
  Administered 2013-01-17: 10 mg via ORAL
  Filled 2013-01-15 (×5): qty 1

## 2013-01-15 MED ORDER — HALOPERIDOL 5 MG PO TABS: 5.0000 mg | ORAL_TABLET | Freq: Two times a day (BID) | ORAL | Status: DC

## 2013-01-15 MED ORDER — METFORMIN HCL 500 MG PO TABS
500.0000 mg | ORAL_TABLET | Freq: Every day | ORAL | Status: DC
  Administered 2013-01-15 – 2013-01-18 (×4): 500 mg via ORAL
  Filled 2013-01-15 (×7): qty 1

## 2013-01-15 MED ORDER — OLANZAPINE 10 MG PO TABS
10.0000 mg | ORAL_TABLET | Freq: Every day | ORAL | Status: DC
  Administered 2013-01-16 – 2013-01-17 (×3): 10 mg via ORAL
  Filled 2013-01-15 (×5): qty 1

## 2013-01-15 MED ORDER — BENZTROPINE MESYLATE 1 MG PO TABS: 1.0000 mg | ORAL_TABLET | Freq: Two times a day (BID) | ORAL | Status: DC

## 2013-01-15 MED ORDER — INSULIN ASPART 100 UNIT/ML ~~LOC~~ SOLN: 4.0000 [IU] | Freq: Three times a day (TID) | SUBCUTANEOUS | Status: DC

## 2013-01-15 NOTE — Progress Notes (Signed)
Patient ID: [name], female   DOB: [birth year], 47 y.o.   MRN: [id number] Psychoeducational Group Note  Date:  01/15/2013 Time:1000am  Group Topic/Focus:  Identifying Needs:   The focus of this group is to help patients identify their personal needs that have been historically problematic and identify healthy behaviors to address their needs.  Participation Level:  Active  Participation Quality:  Appropriate  Affect:  Anxious  Cognitive:  Appropriate  Insight:  Supportive  Engagement in Group:  Supportive  Additional Comments:  Inventory and Psychoeducational group   Valente David 01/15/2013,10:31 AM

## 2013-01-15 NOTE — Progress Notes (Signed)
Patient admitted involuntarily d/t paranoia and delusional thinking. Writer had admission papers signed, belongings searched and secured in locker 15. Patient denies si/hi/a/v hallucinations. Skin assessment done, meal and fluids given, patient oriented to unit and M. Vear Clock RN completed admission.

## 2013-01-15 NOTE — Tx Team (Signed)
Initial Interdisciplinary Treatment Plan  PATIENT STRENGTHS: (choose at least two) Active sense of humor General fund of knowledge  PATIENT STRESSORS: Marital or family conflict Medication change or noncompliance   PROBLEM LIST: Problem List/Patient Goals Date to be addressed Date deferred Reason deferred Estimated date of resolution  Psychosis 01/15/13     Delusion 01/15/13     Risk for suicide 01/15/13                                          DISCHARGE CRITERIA:  Adequate post-discharge living arrangements Improved stabilization in mood, thinking, and/or behavior Verbal commitment to aftercare and medication compliance  PRELIMINARY DISCHARGE PLAN: Attend aftercare/continuing care group Outpatient therapy  PATIENT/FAMIILY INVOLVEMENT: This treatment plan has been presented to and reviewed with the patient, [name].  The patient and family have been given the opportunity to ask questions and make suggestions.  Jacques Navy A 01/15/2013, 1:57 AM

## 2013-01-15 NOTE — [facility] Group Notes (Signed)
[facility] LCSW Group Therapy  01/15/2013   Type of Therapy:  Group Therapy 11:15 to 11:45 AM  Participation Level:  Active  Participation Quality:  Attentive and Sharing  Affect: Paranoid  Cognitive:  Alert and Oriented  Insight:  Limited  Engagement in Therapy:  Limited  Modes of Intervention:  Discussion, Exploration, Rapport Building, Socialization and Support  Summary of Progress/Problems: Topic for group today was readiness for change and the different stages (Not Ready, Readiness, Action and Maintenace) were then discussed and described by group. [name] was able to share that she feels ready for change. She also shared that she came to hospital due to the environment which she was in after loosing her housing.  "I was surrounded by people smoking, some were smoking cocaine and you know I'm allergic to anything with cian in it si I had to come.  I've been here before and feel safe here."   [name], [name]

## 2013-01-15 NOTE — Progress Notes (Signed)
Adult Psychoeducational Group Note  Date:  01/15/2013 Time:  3:15PM  Group Topic/Focus:  Self Care:   The focus of this group is to help patients understand the importance of self-care in order to improve or restore emotional, physical, spiritual, interpersonal, and financial health.  Participation Level:  Active  Participation Quality:  Appropriate  Affect:  Appropriate  Cognitive:  Appropriate  Insight: Appropriate  Engagement in Group:  Engaged  Modes of Intervention:  Discussion  Additional Comments:  Pt participated in group discussion and was active throughout group   Imaya Duffy K 01/15/2013, 3:52 PM
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
## 2013-01-15 NOTE — Progress Notes (Signed)
Patient ID: [name], female   DOB: Dec 22, 1965, 47 y.o.   MRN: [id number] 01-15-13 nursing shift note: D: pt is a new admission. She had no scheduled medications this am RN is awaiting new orders after the MD or extender write new orders. She was anxious this am and was paranoid. A: staff will support and encourage. R: on his inventory sheet she wrote slept well, appetite good, energy normal with depression at 1 and hopelessness at 10. She denies any SI and had no complaints of pains. After discharge she plans to make a "life style change". RN will monitor and Q 158 min ck's continue.

## 2013-01-15 NOTE — Progress Notes (Signed)
[facility] Group Notes:  (Nursing/MHT/Case Management/Adjunct)  Date:  01/15/2013  Time:  2000  Type of Therapy:  Psychoeducational Skills  Participation Level:  Active  Participation Quality:  Appropriate  Affect:  Appropriate  Cognitive:  Lacking  Insight:  Lacking  Engagement in Group:  Improving  Modes of Intervention:  Education  Summary of Progress/Problems: The patient shared with the group that she had a "pretty good day". She states that she attended all of her groups. No further details were provided. Her goal for tomorrow is to speak with someone about her discharge plans.   Hazle Coca S 01/15/2013, 9:37 PM

## 2013-01-16 LAB — GLUCOSE, CAPILLARY
Glucose-Capillary: 87 mg/dL (ref 70–99)
Glucose-Capillary: 87 mg/dL (ref 70–99)
Glucose-Capillary: 81 mg/dL (ref 70–99)
Glucose-Capillary: 121 mg/dL — ABNORMAL HIGH (ref 70–99)

## 2013-01-16 LAB — HEMOGLOBIN A1C: Mean Plasma Glucose: 94 mg/dL (ref <117)

## 2013-01-16 NOTE — [facility] Group Notes (Signed)
[facility] LCSW Group Therapy  01/16/2013   11:00 AM   Type of Therapy:  Group Therapy  Participation Level:  Active  Participation Quality:  Appropriate and Attentive  Affect:  Appropriate but Flat  Cognitive:  Disorganized  Insight:  Developing/Improving and Engaged  Engagement in Therapy:  Developing/Improving and Engaged  Modes of Intervention:  Clarification, Confrontation, Discussion, Education, Exploration, Limit-setting, Orientation, Problem-solving, Rapport Building, Dance movement psychotherapist, Socialization and Support  Summary of Progress/Problems: The main focus of today's process group was to identify the patient's current support system and decide on other supports that can be put in place.  An emphasis was placed on using counselor, doctor, therapy groups, 12-step groups, and problem-specific support groups to expand supports, as well as doing something different than has been done before.  Pt also discussed positive supports versus negative supports, and the importance of knowing the difference.  Pt shared that her friends and family are supportive and that she tries to remain positive.  Pt had flight of ideas but was easily redirected.  Pt actively listened to group discussion.    Reyes Ivan, [place] 01/16/2013 12:26 PM

## 2013-01-16 NOTE — Progress Notes (Signed)
Patient ID: [name], female   DOB: 1965/10/04, 47 y.o.   MRN: [id number] D. The patient is pleasant and interacting in the milieu. Her main focus is the pain she has in her left great toe. She stated that she broke it prior to admission. Reports she was supposed to have had it x-rayed at [facility], but it was never down. Her toe is swollen with bruising and painful to touch.  A. Met with patient 1:1. Encouraged to attend evening group. Offered an ice pack for her left great toe but refused, instead requesting and received a warm pack for her ankle and foot. Medicated for toe pain. R. The patient attended evening wrap up group and actively participated. Her responses were superficial, but appropriate. No evidence of delusional thoughts. Denies any a/v hallucinations. Denies any suicidal ideation.

## 2013-01-16 NOTE — Progress Notes (Signed)
Adult Psychoeducational Group Note  Date:  01/16/2013 Time:  1:40 PM  Group Topic/Focus:  Healthy Communication:   The focus of this group is to discuss communication, barriers to communication, as well as healthy ways to communicate with others.  Participation Level:  Did Not Attend

## 2013-01-16 NOTE — Progress Notes (Signed)
[facility] MD Progress Note  01/16/2013 9:32 AM [name]  MRN:  [id number] Subjective:  Patient states she is fine today. She reports no new problems, denies suicidality or homicidality. Objective:  Patient begins her conversation calmly enough but continues to demonstrate poor judgment with no insight she remains disorganized and delusional.  Diagnosis: Schizoaffective disorder bipolar type  ADL's:  Intact  Sleep: Fair  Appetite:  Good  Suicidal Ideation:  Denies Homicidal Ideation:  Denies AEB (as evidenced by): The patient's behavior, mood, affect, participation in unit programming, as well as the patient's written report on a daily self inventory.  Psychiatric Specialty Exam: Review of Systems  Constitutional: Negative.  Negative for fever, chills, weight loss, malaise/fatigue and diaphoresis.  HENT: Negative for congestion and sore throat.   Eyes: Negative for blurred vision, double vision and photophobia.  Respiratory: Negative for cough, shortness of breath and wheezing.   Cardiovascular: Negative for chest pain, palpitations and PND.  Gastrointestinal: Negative for heartburn, nausea, vomiting, abdominal pain, diarrhea and constipation.  Musculoskeletal: Negative for myalgias, joint pain and falls.  Neurological: Negative for dizziness, tingling, tremors, sensory change, speech change, focal weakness, seizures, loss of consciousness, weakness and headaches.  Endo/Heme/Allergies: Negative for polydipsia. Does not bruise/bleed easily.  Psychiatric/Behavioral: Negative for depression, suicidal ideas, hallucinations, memory loss and substance abuse. The patient is not nervous/anxious and does not have insomnia.     Blood pressure 120/82, pulse 66, temperature 98.2 F (36.8 C), temperature source Oral, resp. rate 16, height 5\' 1"  (1.549 m), weight 91.627 kg (202 lb).Body mass index is 38.19 kg/(m^2).  General Appearance: Disheveled  Eye Contact::  Good  Speech:  Normal Rate   Volume:  Normal  Mood:  Irritable  Affect:  Labile  Thought Process:  Disorganized  Orientation:  NA  Thought Content:  Delusions  Suicidal Thoughts:  No  Homicidal Thoughts:  No  Memory:  NA  Judgement:  Impaired  Insight:  Lacking  Psychomotor Activity:  Normal  Concentration:  Fair  Recall:  Poor  Akathisia:  No  Handed:  Right  AIMS (if indicated):     Assets:  Resilience  Sleep:  Number of Hours: 5   Current Medications: Current Facility-Administered Medications  Medication Dose Route Frequency Provider Last Rate Last Dose  . acetaminophen (TYLENOL) tablet 650 mg  650 mg Oral Q6H PRN Earney Navy, NP   650 mg at 01/16/13 0903  . alum & mag hydroxide-simeth (MAALOX/MYLANTA) 200-200-20 MG/5ML suspension 30 mL  30 mL Oral Q4H PRN Earney Navy, NP      . diphenhydrAMINE (BENADRYL) capsule 25 mg  25 mg Oral QHS PRN Earney Navy, NP   25 mg at 01/15/13 2155  . ibuprofen (ADVIL,MOTRIN) tablet 600 mg  600 mg Oral Q6H PRN Verne Spurr, PA-C   600 mg at 01/16/13 0903  . insulin aspart (novoLOG) injection 0-15 Units  0-15 Units Subcutaneous TID WC Verne Spurr, PA-C      . insulin aspart (novoLOG) injection 4 Units  4 Units Subcutaneous TID WC Verne Spurr, PA-C      . lisinopril (PRINIVIL,ZESTRIL) tablet 10 mg  10 mg Oral Daily Verne Spurr, PA-C      . magnesium hydroxide (MILK OF MAGNESIA) suspension 30 mL  30 mL Oral Daily PRN Earney Navy, NP      . menthol-cetylpyridinium (CEPACOL) lozenge 3 mg  1 lozenge Oral PRN Verne Spurr, PA-C      . metFORMIN (GLUCOPHAGE) tablet 500 mg  500 mg Oral Daily Verne Spurr, PA-C   500 mg at 01/16/13 4540  . OLANZapine (ZYPREXA) tablet 10 mg  10 mg Oral QHS Verne Spurr, PA-C   10 mg at 01/16/13 9811    Lab Results:  Results for orders placed during the hospital encounter of 01/14/13 (from the past 48 hour(s))  GLUCOSE, CAPILLARY     Status: Abnormal   Collection Time    01/14/13  9:36 PM      Result Value  Range   Glucose-Capillary 109 (*) 70 - 99 mg/dL  GLUCOSE, CAPILLARY     Status: None   Collection Time    01/16/13  6:42 AM      Result Value Range   Glucose-Capillary 87  70 - 99 mg/dL    Physical Findings: AIMS: Facial and Oral Movements Muscles of Facial Expression: None, normal Lips and Perioral Area: None, normal Jaw: None, normal,Extremity Movements Upper (arms, wrists, hands, fingers): None, normal Lower (legs, knees, ankles, toes): None, normal, Trunk Movements Neck, shoulders, hips: None, normal, Overall Severity Severity of abnormal movements (highest score from questions above): None, normal Incapacitation due to abnormal movements: None, normal Patient's awareness of abnormal movements (rate only patient's report): No Awareness, Dental Status Current problems with teeth and/or dentures?: No Does patient usually wear dentures?: Yes  CIWA:  CIWA-Ar Total: 0 COWS:  COWS Total Score: 1  Treatment Plan Summary: Daily contact with patient to assess and evaluate symptoms and progress in treatment Medication management  Plan: 1. Continue crisis management and stabilization. 2. Medication management to reduce current symptoms to base line and improve patient's overall level of functioning 3. Treat health problems as indicated. 4. Develop treatment plan to decrease risk of relapse upon discharge and the need for     readmission. 5. Psycho-social education regarding relapse prevention and self care. 6. Health care follow up as needed for medical problems. 7. Continue home medications where appropriate.   Medical Decision Making Problem Points:  Established problem, stable/improving (1) Data Points:  Review of medication regiment & side effects (2)  I certify that inpatient services furnished can reasonably be expected to improve the patient's condition.   MASHBURN,NEIL 01/16/2013, 9:32 AM  Reviewed the information documented and agree with the treatment  plan.  Chandlor Noecker,JANARDHAHA R. 01/24/2013 12:31 PM
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
## 2013-01-16 NOTE — Progress Notes (Signed)
Nutrition Brief Note  Patient identified on the Malnutrition Screening Tool (MST) Report  Assessment:  Body mass index is 38.19 kg/(m^2). Patient meets criteria for obesity grade II based on current BMI.  Ht: 5'1'' Wt: 202 lbs IBW: 105 lbs  Pt admitted with drug abuse, hallucinations, and a broken toe. Pt reports that her diabetes is well controlled. She also says that she basically eats what she wants. She reports that her weight has been as high as 268 within the past year, and that it has been as low as 170 lbs. She reported a 100 lb wt loss after being diagnosed with diabetes, but that she gained some of it back.   Nutrition Diagnosis: Overweight related to diabetes as evidenced by a BMI of 38.2  Intervention: - Educated pt on a healthy diet for a diabetic. Used teach-back method and plate method. Provided handouts  Monitor: - Labs, weight, po intake, level of knowledge  Current diet order is carb mod. Labs and medications reviewed.    If further nutrition issues arise, please consult RD.   [name] RD, LDN

## 2013-01-16 NOTE — Progress Notes (Addendum)
Spoke with PA, Lloyd Huger. Pt continues to c/o left foot pain. Painful to touch and with any weight bearing a 10/10. Slight swelling to the top of the foot with some discoloration. Pt dose shave good pulse. She states she slipped at home and fell. Pt c/o pain is not only localized to the left great toe but also extends to the top of foot and ankle area. Denies SI or HI and contracts for safety. Pt states her depression is a 1/10 today and her hopelessness is a 10/10. She came to group for about one minute and then left. Pt denies any Hallucinations at this time.

## 2013-01-17 LAB — GLUCOSE, CAPILLARY: Glucose-Capillary: 92 mg/dL (ref 70–99)

## 2013-01-17 MED ORDER — OLANZAPINE 10 MG PO TABS
10.0000 mg | ORAL_TABLET | Freq: Three times a day (TID) | ORAL | Status: DC | PRN
  Administered 2013-01-17: 10 mg via ORAL
  Filled 2013-01-17: qty 1

## 2013-01-17 MED ORDER — LISINOPRIL 5 MG PO TABS
5.0000 mg | ORAL_TABLET | Freq: Every day | ORAL | Status: DC
  Administered 2013-01-18: 5 mg via ORAL
  Filled 2013-01-17 (×2): qty 1

## 2013-01-17 NOTE — Progress Notes (Signed)
Patient ID: [name], female   DOB: 12-23-1965, 47 y.o.   MRN: [id number]  The patient is pleasant and has a fixed smile on her face. Her main focus is on her left great toe that she injured prior to admission. Stated that she did not attend many groups today because she stayed in bed to keep off her feet. She is hoping for discharge home tomorrow. Stated that her family is her support system and has her discharge plan for recovery in place. However, she can not elaborate as to what that plan is.

## 2013-01-17 NOTE — BHH Group Notes (Signed)
[facility] LCSW Aftercare Discharge Planning Group Note   01/17/2013 8:45 AM  Participation Quality:  Alert and Appropriate   Mood/Affect:  Appropriate and Calm  Depression Rating:  0  Anxiety Rating:  2  Thoughts of Suicide:  Pt denies SI/HI  Will you contract for safety?   Yes  Current AVH:  Pt denies AVH  Plan for Discharge/Comments:  Pt attended discharge planning group and actively participated in group.  CSW provided pt with today's workbook.  Pt states that she lives in [place] and can return home there.  Pt states that she follows up at [facility] for medication management and therapy.  No further needs voiced by pt at this time.    Transportation Means: Pt has access to transportation   Supports: No supports mentioned at this time  Reyes Ivan, LCSWA 01/17/2013 9:55 AM

## 2013-01-17 NOTE — Progress Notes (Signed)
D: Pt denies SI/HI/AVH. Pt c/o swelling to left big toe that occurred 4 days ago related to a fall. NP made aware. Pt received prn Motrin this morning for pain. Pt stated that she is feeling hopeful today. Pt denies feeling depressed today. Pt stated that she will keep benadryl on hand to help reduce her allergies in order to avoid having another panic attack. Pt stated that she is allergic to chemicals in the environment A: Medications administered as ordered per MD. Verbal support given. Pt encouraged to attend groups. 15 minute checks performed for safety. R:  Pt presents with fixed smile, brief eye contact and poor hygiene.

## 2013-01-17 NOTE — Progress Notes (Signed)
[facility] MD Progress Note  01/17/2013 3:19 PM RAYSHA TILMON  MRN:  [id number] Subjective:  "I'm ok today." Objective: Patient is initially pleasant and cooperative, then mood shifts to anxious, tearful and accusatory stating that she has no choice in the decision whether or not she stays at [facility] today or leaves. Patient had agreed to continue the Zyprexa for her anxiety and agreed to follow up at [facility] in [place] with a therapist. She ended the conversation with tears and chest pain, walking out of the room. She was later seen up and active in the milieu with no problems. Patient is walking without difficulty.  Diagnosis:  Schizoaffective disorder   ADL's:  Impaired  Sleep: Good  Appetite:  Fair  Suicidal Ideation:  denies Homicidal Ideation:  denies AEB (as evidenced by): Patient's behavior, affect, and reports of decreasing symptoms.  Psychiatric Specialty Exam: Review of Systems  Unable to perform ROS: mental acuity    Blood pressure 135/89, pulse 60, temperature 97 F (36.1 C), temperature source Oral, resp. rate 16, height 5\' 1"  (1.549 m), weight 91.627 kg (202 lb).Body mass index is 38.19 kg/(m^2).  General Appearance: Disheveled  Eye Contact::  Good  Speech:  Clear and Coherent  Volume:  Increased  Mood:  Euthymic and Irritable  Affect:  Labile  Thought Process:  Disorganized  Orientation:  NA  Thought Content:  Delusions  Suicidal Thoughts:  No  Homicidal Thoughts:  No  Memory:  NA  Judgement:  Impaired  Insight:  Lacking  Psychomotor Activity:  Increased  Concentration:  Poor  Recall:  Poor  Akathisia:  No  Handed:  Right  AIMS (if indicated):     Assets:  Housing  Sleep:  Number of Hours: 5   Current Medications: Current Facility-Administered Medications  Medication Dose Route Frequency Provider Last Rate Last Dose  . acetaminophen (TYLENOL) tablet 650 mg  650 mg Oral Q6H PRN Earney Navy, NP   650 mg at 01/16/13 0903  . alum & mag  hydroxide-simeth (MAALOX/MYLANTA) 200-200-20 MG/5ML suspension 30 mL  30 mL Oral Q4H PRN Earney Navy, NP      . diphenhydrAMINE (BENADRYL) capsule 25 mg  25 mg Oral QHS PRN Earney Navy, NP   25 mg at 01/16/13 2114  . ibuprofen (ADVIL,MOTRIN) tablet 600 mg  600 mg Oral Q6H PRN Verne Spurr, PA-C   600 mg at 01/17/13 0815  . lisinopril (PRINIVIL,ZESTRIL) tablet 10 mg  10 mg Oral Daily Verne Spurr, PA-C   10 mg at 01/17/13 0843  . magnesium hydroxide (MILK OF MAGNESIA) suspension 30 mL  30 mL Oral Daily PRN Earney Navy, NP      . menthol-cetylpyridinium (CEPACOL) lozenge 3 mg  1 lozenge Oral PRN Verne Spurr, PA-C      . metFORMIN (GLUCOPHAGE) tablet 500 mg  500 mg Oral Daily Verne Spurr, PA-C   500 mg at 01/17/13 4098  . OLANZapine (ZYPREXA) tablet 10 mg  10 mg Oral QHS Verne Spurr, PA-C   10 mg at 01/16/13 2113  . OLANZapine (ZYPREXA) tablet 10 mg  10 mg Oral TID PRN Verne Spurr, PA-C        Lab Results:  Results for orders placed during the hospital encounter of 01/14/13 (from the past 48 hour(s))  HEMOGLOBIN A1C     Status: None   Collection Time    01/16/13  6:41 AM      Result Value Range   Hemoglobin A1C 4.9  <5.7 %  Comment: (NOTE)                                                                               According to the ADA Clinical Practice Recommendations for 2011, when     HbA1c is used as a screening test:      >=6.5%   Diagnostic of Diabetes Mellitus               (if abnormal result is confirmed)     5.7-6.4%   Increased risk of developing Diabetes Mellitus     References:Diagnosis and Classification of Diabetes Mellitus,Diabetes     Care,2011,34(Suppl 1):S62-S69 and Standards of Medical Care in             Diabetes - 2011,Diabetes Care,2011,34 (Suppl 1):S11-S61.   Mean Plasma Glucose 94  <117 mg/dL  GLUCOSE, CAPILLARY     Status: None   Collection Time    01/16/13  6:42 AM      Result Value Range   Glucose-Capillary 87  70 - 99 mg/dL   GLUCOSE, CAPILLARY     Status: None   Collection Time    01/16/13 11:36 AM      Result Value Range   Glucose-Capillary 87  70 - 99 mg/dL   Comment 1 Documented in Chart     Comment 2 Notify RN    GLUCOSE, CAPILLARY     Status: None   Collection Time    01/16/13  5:07 PM      Result Value Range   Glucose-Capillary 81  70 - 99 mg/dL   Comment 1 Documented in Chart     Comment 2 Notify RN    GLUCOSE, CAPILLARY     Status: Abnormal   Collection Time    01/16/13 10:22 PM      Result Value Range   Glucose-Capillary 121 (*) 70 - 99 mg/dL  GLUCOSE, CAPILLARY     Status: None   Collection Time    01/17/13  6:26 AM      Result Value Range   Glucose-Capillary 92  70 - 99 mg/dL    Physical Findings:  Evaluated her toe and she states it is better and does not want to go to the ED at this time.  AIMS: Facial and Oral Movements Muscles of Facial Expression: None, normal Lips and Perioral Area: None, normal Jaw: None, normal,Extremity Movements Upper (arms, wrists, hands, fingers): None, normal Lower (legs, knees, ankles, toes): None, normal, Trunk Movements Neck, shoulders, hips: None, normal, Overall Severity Severity of abnormal movements (highest score from questions above): None, normal Incapacitation due to abnormal movements: None, normal Patient's awareness of abnormal movements (rate only patient's report): No Awareness, Dental Status Current problems with teeth and/or dentures?: No Does patient usually wear dentures?: Yes  CIWA:  CIWA-Ar Total: 0 COWS:  COWS Total Score: 1  Treatment Plan Summary: Daily contact with patient to assess and evaluate symptoms and progress in treatment Medication management  Plan: 1. Continue crisis management and stabilization. 2. Medication management to reduce current symptoms to base line and improve patient's overall level of functioning 3. Treat health problems as indicated. 4. Develop treatment plan to decrease risk of relapse upon  discharge and the need for readmission. 5. Psycho-social education regarding relapse prevention and self care. 6. Health care follow up as needed for medical problems. 7. Continue home medications where appropriate. 8. Will increase the Zyprexa to 15mg  at hs. 9. Will add Zyprexa 10mg  po TID prn agitation.   Medical Decision Making Problem Points:  Established problem, stable/improving (1), New problem, with no additional work-up planned (3) and Review of psycho-social stressors (1) Data Points:  Review and summation of old records (2)  I certify that inpatient services furnished can reasonably be expected to improve the patient's condition.  Rona Ravens. Shamarcus Hoheisel RPAC 4:30 PM 01/17/2013

## 2013-01-17 NOTE — Tx Team (Signed)
Interdisciplinary Treatment Plan Update (Adult)  Date: 01/17/2013  Time Reviewed:  9:45 AM  Progress in Treatment: Attending groups: Yes Participating in groups:  Yes Taking medication as prescribed:  Yes Tolerating medication:  Yes Family/Significant othe contact made: CW assessing Patient understands diagnosis:  Yes Discussing patient identified problems/goals with staff:  Yes Medical problems stabilized or resolved:  Yes Denies suicidal/homicidal ideation: Yes Issues/concerns per patient self-inventory:  Yes Other:  New problem(s) identified: N/A  Discharge Plan or Barriers: CSW assessing for appropriate referrals. Pt states that she follows up at [facility] for medication management and therapy.    Reason for Continuation of Hospitalization: Anxiety Depression Medication Stabilization Delusional  Comments: N/A  Estimated length of stay: 3-5 days  For review of initial/current patient goals, please see plan of care.  Attendees: Patient:   01/17/2013 9:38 AM   Family:     Physician:  Dr. Thedore Mins 01/17/2013 9:38 AM   Nursing:   Celso Sickle, RN 01/17/2013 9:38 AM   Clinical Social Worker:  Reyes Ivan, LCSWA 01/17/2013 9:38 AM   Other: Liborio Nixon, RN 01/17/2013 9:38 AM   Other:  Verne Spurr, PA 01/17/2013 9:39 AM   Other:     Other:     Other:    Other:    Other:    Other:    Other:    Other:     Scribe for Treatment Team:   Carmina Miller, 01/17/2013 9:38 AM

## 2013-01-17 NOTE — Progress Notes (Signed)
Adult Psychoeducational Group Note  Date:  01/16/2013 Time:  800 pm  Group Topic/Focus:  Wrap-Up Group:   The focus of this group is to help patients review their daily goal of treatment and discuss progress on daily workbooks.  Participation Level:  Active  Participation Quality:  Appropriate  Affect:  Appropriate  Cognitive:  Appropriate  Insight: Appropriate  Engagement in Group:  Engaged  Modes of Intervention:  Discussion  Additional Comments:  Pt reported that she was also planning for discharge the following day.  Pt reported that she had an overall god day except her foot hurting and limiting her ability to attend all groups.  Pt expressed that her support system is her family.  Pt also expressed how she plans to manage her stress level to avoid future crisis.  Marvis Moeller A 01/17/2013, 5:24 AM

## 2013-01-17 NOTE — Progress Notes (Signed)
Recreation Therapy Notes  Date: 06.23.2014        Time: 9:30am Location: [address] Dayroom      Group Topic/Focus: Self Expression  Participation Level: Active  Participation Quality: Appropriate  Affect: Euthymic  Cognitive: Appropriate   Additional Comments: Activity: Colors within Me; Explanation: Patients were asked to assign a color to a specific emotion. Using the colors they identified there were asked to color a blank face to represent the emotions most present in them. Classical music was played to enhance therapeutic environment.  Patient actively participated in group activity. Patient drew a facial features on her face worksheet, including a smiling mouth drawn in red, and eyes drawn in purple, the eyes on patient worksheet were drew as if they were looking at the nose. Patient colored hair in the worksheet in yellow. Patient volunteered to share what her worksheet represented, patient stated yellow = brightness, red = cheerful. Patient did not indicate why the eyes were colored purple or drawn in the fashion they were. Patient stated she kept her drawing simple because she is a simple woman. Patient speech slightly pressured. Patient laughed to herself while drawing the face on her worksheet. Patient contributed to the wrap up discussion about the importance of being in touch with your emotions. Patient stated being in touch with her emotions helps her communicate with people.   Marykay Lex Ferron Ishmael, LRT/CTRS  Jearl Klinefelter 01/17/2013 12:58 PM

## 2013-01-17 NOTE — BHH Group Notes (Signed)
BHH LCSW Group Therapy  01/17/2013  1:15 PM   Type of Therapy:  Group Therapy  Participation Level:  Active  Participation Quality:  Appropriate and Attentive  Affect:  Appropriate  Cognitive:  Alert and Appropriate but off topic  Insight:  Developing/Improving and Engaged  Engagement in Therapy:  Developing/Improving and Engaged  Modes of Intervention:  Activity, Clarification, Confrontation, Discussion, Education, Exploration, Limit-setting, Orientation, Problem-solving, Rapport Building, Dance movement psychotherapist, Socialization and Support  Summary of Progress/Problems: Pt identified obstacles faced currently and processed barriers involved in overcoming these obstacles. Pt identified steps necessary for overcoming these obstacles and explored motivation (internal and external) for facing these difficulties head on. Pt further identified one area of concern in their lives and chose a goal to focus on for today.  Pt shared that she is a free spirit and would like to be outside with the fresh air and sun right now.  Pt states that she isn't faced with any obstacles at this time and is just staying positive no matter how bad things get.  Pt was off topic when sharing but was attentive and attempted to participate in group discussion.    [name], [place] 01/17/2013 2:46 PM

## 2013-01-17 NOTE — BHH Suicide Risk Assessment (Signed)
BHH INPATIENT: Family/Significant Other Suicide Prevention Education  Suicide Prevention Education:  Education Completed; No one has been identified by the patient as the family member/significant other with whom the patient will be residing, and identified as the person(s) who will aid the patient in the event of a mental health crisis (suicidal ideations/suicide attempt). With written consent from the patient, the family member/significant other has been provided the following suicide prevention education, prior to the and/or following the discharge of the patient.  The suicide prevention education provided includes the following:  Suicide risk factors  Suicide prevention and interventions  National Suicide Hotline telephone number  [facility] assessment telephone number  [place] Emergency Assistance 911  County and/or Residential Mobile Crisis Unit telephone number Request made of family/significant other to:  Remove weapons (e.g., guns, rifles, knives), all items previously/currently identified as safety concern.  Remove drugs/medications (over-the-counter, prescriptions, illicit drugs), all items previously/currently identified as a safety concern. The family member/significant other verbalizes understanding of the suicide prevention education information provided. The family member/significant other agrees to remove the items of safety concern listed above. Pt did not c/o SI at admission, nor have they endorsed SI during their stay here. SPE not required.  Denai Caba Horton, LCSWA 01/17/2013  9:25 AM     

## 2013-01-18 MED ORDER — METFORMIN HCL 500 MG PO TABS: 500.0000 mg | ORAL_TABLET | Freq: Every day | ORAL | Status: DC

## 2013-01-18 MED ORDER — OLANZAPINE 10 MG PO TABS: 10.0000 mg | ORAL_TABLET | Freq: Every day | ORAL | Status: DC

## 2013-01-18 NOTE — Progress Notes (Signed)
[facility] Adult Case Management Discharge Plan :  Will you be returning to the same living situation after discharge: Yes,  returning home At discharge, do you have transportation home?:Yes,  husband is picking pt up Do you have the ability to pay for your medications:Yes,  access to meds  Release of information consent forms completed and in the chart;  Patient's signature needed at discharge.  Patient to Follow up at: Follow-up Information   Follow up with Arna Medici On 01/20/2013. (Walk in on this date between 7:45 am - 11 am.  Referral [phone])    Contact information:   Physical Address: [address], [place], [place] [address]    Mailing Address: [address] [place], [place] [address] Phone: [phone] Fax: [phone]      Patient denies SI/HI:   Yes,  denies SI/HI    Safety Planning and Suicide Prevention discussed:  Yes,  discussed with pt, n/a to review with family as pt was not admitted for SI.  See suicide prevention note.  Carmina Miller 01/18/2013, 12:04 PM

## 2013-01-18 NOTE — Tx Team (Signed)
Interdisciplinary Treatment Plan Update (Adult)  Date: 01/18/2013  Time Reviewed:  9:45 AM  Progress in Treatment: Attending groups: Yes Participating in groups:  Yes Taking medication as prescribed:  Yes Tolerating medication:  Yes Family/Significant othe contact made: CSW will attempt today Patient understands diagnosis:  Yes Discussing patient identified problems/goals with staff:  Yes Medical problems stabilized or resolved:  Yes Denies suicidal/homicidal ideation: Yes Issues/concerns per patient self-inventory:  Yes Other:  New problem(s) identified: N/A  Discharge Plan or Barriers:  Pt will follow up at [facility] for medication management and therapy.    Reason for Continuation of Hospitalization: Stable to d/c  Comments: N/A  Estimated length of stay: D/C today  For review of initial/current patient goals, please see plan of care.  Attendees: Patient:  [name]  01/18/2013 9:38 AM   Family:     Physician:  Dr. Thedore Mins 01/18/2013 9:38 AM   Nursing:   Celso Sickle, RN 01/18/2013 9:38 AM   Clinical Social Worker:  Reyes Ivan, LCSWA 01/18/2013 9:38 AM   Other: Liborio Nixon, RN 01/18/2013 9:38 AM   Other:  Verne Spurr, PA 01/18/2013 9:39 AM   Other:     Other:     Other:    Other:    Other:    Other:    Other:    Other:     Scribe for Treatment Team:   Carmina Miller, 01/18/2013 9:38 AM

## 2013-01-18 NOTE — Discharge Summary (Signed)
Physician Discharge Summary Note  Patient:  [name] is an 47 y.o., female MRN:  [id number] DOB:  May 25, 1966 Patient phone:  [phone] (home)  Patient address:   [address] [place] [address],   Date of Admission:  01/14/2013 Date of Discharge: 01/18/2013  Reason for Admission:  Schizoaffective  Discharge Diagnoses: Principal Problem:   Schizoaffective disorder, bipolar type Active Problems:   DM   BIPOLAR DISORDER UNSPECIFIED  Review of Systems  Constitutional: Negative.  Negative for fever, chills, weight loss, malaise/fatigue and diaphoresis.  HENT: Negative for congestion and sore throat.   Eyes: Negative for blurred vision, double vision and photophobia.  Respiratory: Negative for cough, shortness of breath and wheezing.   Cardiovascular: Negative for chest pain, palpitations and PND.  Gastrointestinal: Negative for heartburn, nausea, vomiting, abdominal pain, diarrhea and constipation.  Musculoskeletal: Negative for myalgias, joint pain and falls.  Neurological: Negative for dizziness, tingling, tremors, sensory change, speech change, focal weakness, seizures, loss of consciousness, weakness and headaches.  Endo/Heme/Allergies: Negative for polydipsia. Does not bruise/bleed easily.  Psychiatric/Behavioral: Negative for depression, suicidal ideas, hallucinations, memory loss and substance abuse. The patient is not nervous/anxious and does not have insomnia.    Discharge Diagnoses:  AXIS I: Schizoaffective disorder, bipolar type  AXIS II: Deferred  AXIS III:  Past Medical History   Diagnosis  Date   .  Diabetes mellitus    .  Fibromyalgia     AXIS IV: other psychosocial or environmental problems and problems related to social environment  AXIS V: 61-70 mild symptoms   Level of Care:  OP  Hospital Course:        This is an involuntary admission for [name] was a 47 year old female who arrived at the [facility] emergency room via ambulance. Patient  reported she was having an allergic reaction to crack cocaine that she was being exposed to by family members. She was disorganized paranoid rambling and delusional. She was given medical clearance and accepted to behavioral health hospital for further stabilization and treatment.      Upon arrival at the unit [name] was found to be delusional and paranoid saying she doesn't know why she was involuntarily committed that she went along with it just to be on the safe side.  She was pleasant and cooperative but tangential and delusional. She was focused on her toe which she felt she had broken and was frustrated that he had not been x-rayed in the emergency room.  For this she was treated with ibuprofen and ice and reported resolution and pain and declined to have any further treatment.       She continued to be cooperative and pleasant after her medication was started but remained delusional with some mood lability. She was able to attend some groups and was active in the unit milieu.        By the day of discharge she was much more organized and less paranoid, she continued to deny suicidal or homicidal ideation she denied auditory or visual hallucinations and was anxious for discharge. She was in much improved condition upon arrival and all felt that she was stable for discharge with a plan to followup low as noted.          Consults:  None  Significant Diagnostic Studies:  None  Discharge Vitals:   Blood pressure 112/73, pulse 84, temperature 98.2 F (36.8 C), temperature source Oral, resp. rate 20, height 5\' 1"  (1.549 m), weight 91.627 kg (202 lb). Body mass  index is 38.19 kg/(m^2). Lab Results:   Results for orders placed during the hospital encounter of 01/14/13 (from the past 72 hour(s))  HEMOGLOBIN A1C     Status: None   Collection Time    01/16/13  6:41 AM      Result Value Range   Hemoglobin A1C 4.9  <5.7 %   Comment: (NOTE)                                                                                According to the ADA Clinical Practice Recommendations for 2011, when     HbA1c is used as a screening test:      >=6.5%   Diagnostic of Diabetes Mellitus               (if abnormal result is confirmed)     5.7-6.4%   Increased risk of developing Diabetes Mellitus     References:Diagnosis and Classification of Diabetes Mellitus,Diabetes     Care,2011,34(Suppl 1):S62-S69 and Standards of Medical Care in             Diabetes - 2011,Diabetes Care,2011,34 (Suppl 1):S11-S61.   Mean Plasma Glucose 94  <117 mg/dL  GLUCOSE, CAPILLARY     Status: None   Collection Time    01/16/13  6:42 AM      Result Value Range   Glucose-Capillary 87  70 - 99 mg/dL  GLUCOSE, CAPILLARY     Status: None   Collection Time    01/16/13 11:36 AM      Result Value Range   Glucose-Capillary 87  70 - 99 mg/dL   Comment 1 Documented in Chart     Comment 2 Notify RN    GLUCOSE, CAPILLARY     Status: None   Collection Time    01/16/13  5:07 PM      Result Value Range   Glucose-Capillary 81  70 - 99 mg/dL   Comment 1 Documented in Chart     Comment 2 Notify RN    GLUCOSE, CAPILLARY     Status: Abnormal   Collection Time    01/16/13 10:22 PM      Result Value Range   Glucose-Capillary 121 (*) 70 - 99 mg/dL  GLUCOSE, CAPILLARY     Status: None   Collection Time    01/17/13  6:26 AM      Result Value Range   Glucose-Capillary 92  70 - 99 mg/dL    Physical Findings: AIMS: Facial and Oral Movements Muscles of Facial Expression: None, normal Lips and Perioral Area: None, normal Jaw: None, normal Tongue: None, normal,Extremity Movements Upper (arms, wrists, hands, fingers): None, normal Lower (legs, knees, ankles, toes): None, normal, Trunk Movements Neck, shoulders, hips: None, normal, Overall Severity Severity of abnormal movements (highest score from questions above): None, normal Incapacitation due to abnormal movements: None, normal Patient's awareness of abnormal movements (rate only  patient's report): No Awareness, Dental Status Current problems with teeth and/or dentures?: No Does patient usually wear dentures?: Yes  CIWA:  CIWA-Ar Total: 0 COWS:  COWS Total Score: 1  Psychiatric Specialty Exam: See Psychiatric Specialty Exam and Suicide Risk Assessment completed by Attending Physician prior to  discharge.  Discharge destination:  Home  Is patient on multiple antipsychotic therapies at discharge:  No   Has Patient had three or more failed trials of antipsychotic monotherapy by history:  No  Recommended Plan for Multiple Antipsychotic Therapies:  Not applicable   Discharge Orders   Future Orders Complete By Expires     Diet - low sodium heart healthy  As directed     Discharge instructions  As directed     Comments:      Take all of your medications as directed. Be sure to keep all of your follow up appointments.  If you are unable to keep your follow up appointment, call your Doctor's office to let them know, and reschedule.  Make sure that you have enough medication to last until your appointment. Be sure to get plenty of rest. Going to bed at the same time each night will help. Try to avoid sleeping during the day.  Increase your activity as tolerated. Regular exercise will help you to sleep better and improve your mental health. Eating a heart healthy diet is recommended. Try to avoid salty or fried foods. Be sure to avoid all alcohol and illegal drugs.    Increase activity slowly  As directed         Medication List    TAKE these medications     Indication   acetaminophen 500 MG tablet  Commonly known as:  TYLENOL  Take 1,000 mg by mouth every 6 (six) hours as needed for pain.      diphenhydrAMINE 25 MG tablet  Commonly known as:  BENADRYL  Take 25 mg by mouth every 6 (six) hours as needed for itching.      metFORMIN 500 MG tablet  Commonly known as:  GLUCOPHAGE  Take 1 tablet (500 mg total) by mouth daily.   Indication:  Type 2 Diabetes      OLANZapine 10 MG tablet  Commonly known as:  ZYPREXA  Take 1 tablet (10 mg total) by mouth at bedtime.   Indication:  Manic-Depression     prenatal multivitamin Tabs  Take 1 tablet by mouth daily at 12 noon.    for nutritional supplement         Follow-up Information   Follow up with Arna Medici.   Contact information:   Physical Address: [address], [place], [place] [address]    Mailing Address: [address] [place], [place] [address] Phone: [phone] Fax: [phone]      Follow-up recommendations:   Activities: Resume activity as tolerated. Diet: Heart healthy low sodium diet Tests: Follow up testing will be determined by your out patient provider. Comments:    Total Discharge Time:  Greater than 30 minutes.  Signed: Rona Ravens. Britaney Espaillat RPAC 10:30 AM 01/18/2013

## 2013-01-18 NOTE — Clinical Social Work Note (Signed)
CSW spoke with pt's husband, Zoua Caporaso [phone]) at this time.  Mr. Ranney wanted to make sure pt was d/c today before he drives here.  Mr. Folmer inquired about pt's discharge plans, appointment and meds.  Mr. Moomaw states that he has observed pt to be doing better and believes she is ready to d/c today.  No further needs voiced by family at this time.    Reyes Ivan, LCSWA 01/18/2013  10:45 AM

## 2013-01-18 NOTE — Progress Notes (Signed)
Patient ID: [name], female   DOB: 1966/01/05, 47 y.o.   MRN: [id number]  D: Pt smiled as the Clinical research associate introduced herself. Several times during the assessment pt would laugh inappropriately. Informed the writer that she initially went to [facility] because of "smoke and environmental". Pt then stated, "I guess. You know what I mean", while laughing. Writer found it difficult to follow the pt in conversation.  A:  Support and encouragement was offered. 15 min checks continued for safety.  R: Pt remains safe.

## 2013-01-18 NOTE — BHH Group Notes (Addendum)
[facility] LCSW Aftercare Discharge Planning Group Note   01/18/2013 8:45 AM  Participation Quality:  Alert and Appropriate   Mood/Affect:  Appropriate and Bright  Depression Rating:  0  Anxiety Rating:  0  Thoughts of Suicide:  Pt denies SI/HI  Will you contract for safety?   Yes  Current AVH:  Pt denies AVH  Plan for Discharge/Comments:  Pt attended discharge planning group and actively participated in group.  CSW provided pt with today's workbook.  Pt states that she feels ready to d/c today.  Pt will return home in [place].  Pt has follow up scheduled at [facility] for medication management and therapy.  No further needs voiced by pt at this time.    Transportation Means: Pt states that her husband will pick pt up  Supports: No supports mentioned at this time  Reyes Ivan, LCSWA 01/18/2013 9:41 AM

## 2013-01-18 NOTE — BHH Suicide Risk Assessment (Signed)
Suicide Risk Assessment  Discharge Assessment     Demographic Factors:  Low socioeconomic status, Unemployed and female  Mental Status Per Nursing Assessment::   On Admission:  NA  Current Mental Status by Physician: patient denies suicidal ideation, intent or plan  Loss Factors: Decrease in vocational status and Financial problems/change in socioeconomic status  Historical Factors: Family history of mental illness or substance abuse and Impulsivity  Risk Reduction Factors:   Sense of responsibility to family, Living with another person, especially a relative and Positive social support  Continued Clinical Symptoms:  Alcohol/Substance Abuse/Dependencies  Cognitive Features That Contribute To Risk:  Closed-mindedness Polarized thinking    Suicide Risk:  Minimal: No identifiable suicidal ideation.  Patients presenting with no risk factors but with morbid ruminations; may be classified as minimal risk based on the severity of the depressive symptoms  Discharge Diagnoses:   AXIS I:  Schizoaffective disorder, bipolar type  AXIS II:  Deferred AXIS III:   Past Medical History  Diagnosis Date  . Bipolar 1 disorder     pt denies, says was misdiagnosed  . Diabetes mellitus   . Fibromyalgia   . Anxiety   . Panic attack    AXIS IV:  other psychosocial or environmental problems and problems related to social environment AXIS V:  61-70 mild symptoms  Plan Of Care/Follow-up recommendations:  Activity:  as tolerated Diet:  healthy Tests:  routine Other:  patient to keep her after care appointment  Is patient on multiple antipsychotic therapies at discharge:  No   Has Patient had three or more failed trials of antipsychotic monotherapy by history:  No  Recommended Plan for Multiple Antipsychotic Therapies: N/A  Shepard Keltz,MD 01/18/2013, 10:10 AM

## 2013-01-18 NOTE — Progress Notes (Signed)
Patient ID: [name], female   DOB: 07-17-1966, 47 y.o.   MRN: [id number] Patient is discharged ambulatory to ride home with her husband.  She denies SI/HI.  She verbalizes understanding of her discharge meds and follow up.  She was given a supply of meds and scripts by MD.  She says that she has been here so many times it feels like home.  Encouraged patient to take her meds and keep her followup appointments, and to come back if she needs to.  Expressed hope that patient will do well.

## 2013-01-20 NOTE — Progress Notes (Signed)
Patient Discharge Instructions:  After Visit Summary (AVS):   Faxed to:  01/20/13 Psychiatric Admission Assessment Note:   Faxed to:  01/20/13 Suicide Risk Assessment - Discharge Assessment:   Faxed to:  01/20/13 Faxed/Sent to the Next Level Care provider:  01/20/13 Faxed to [facility] @ [phone]  Jerelene Redden, 01/20/2013, 3:57 PM

## 2013-01-24 NOTE — Discharge Summary (Signed)
Seen and agreed. Andrea Deman, MD 

## 2013-02-11 ENCOUNTER — Emergency Department (HOSPITAL_COMMUNITY)
Admission: EM | Admit: 2013-02-11 | Discharge: 2013-02-11 | Disposition: A | Discharge status: Home / Self Care | Payer: MEDICAID | Attending: Emergency Medicine | Admitting: Emergency Medicine

## 2013-02-11 ENCOUNTER — Encounter (HOSPITAL_COMMUNITY): Payer: Self-pay

## 2013-02-11 DIAGNOSIS — Z79899 Other long term (current) drug therapy: Secondary | ICD-10-CM | POA: Insufficient documentation

## 2013-02-11 DIAGNOSIS — E119 Type 2 diabetes mellitus without complications: Secondary | ICD-10-CM | POA: Insufficient documentation

## 2013-02-11 DIAGNOSIS — F319 Bipolar disorder, unspecified: Secondary | ICD-10-CM | POA: Insufficient documentation

## 2013-02-11 DIAGNOSIS — Z8659 Personal history of other mental and behavioral disorders: Secondary | ICD-10-CM | POA: Insufficient documentation

## 2013-02-11 DIAGNOSIS — E669 Obesity, unspecified: Secondary | ICD-10-CM | POA: Insufficient documentation

## 2013-02-11 DIAGNOSIS — F17200 Nicotine dependence, unspecified, uncomplicated: Secondary | ICD-10-CM | POA: Insufficient documentation

## 2013-02-11 DIAGNOSIS — F419 Anxiety disorder, unspecified: Secondary | ICD-10-CM

## 2013-02-11 DIAGNOSIS — Z8739 Personal history of other diseases of the musculoskeletal system and connective tissue: Secondary | ICD-10-CM | POA: Insufficient documentation

## 2013-02-11 DIAGNOSIS — F411 Generalized anxiety disorder: Secondary | ICD-10-CM | POA: Insufficient documentation
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
## 2013-05-11 ENCOUNTER — Emergency Department (HOSPITAL_COMMUNITY)
Admission: EM | Admit: 2013-05-11 | Discharge: 2013-05-11 | Disposition: A | Discharge status: Home / Self Care | Payer: Medicaid Other | Attending: Emergency Medicine | Admitting: Emergency Medicine

## 2013-05-11 ENCOUNTER — Encounter (HOSPITAL_COMMUNITY): Payer: Self-pay | Admitting: Emergency Medicine

## 2013-05-11 DIAGNOSIS — Z79899 Other long term (current) drug therapy: Secondary | ICD-10-CM | POA: Insufficient documentation

## 2013-05-11 DIAGNOSIS — G8921 Chronic pain due to trauma: Secondary | ICD-10-CM | POA: Insufficient documentation

## 2013-05-11 DIAGNOSIS — F410 Panic disorder [episodic paroxysmal anxiety] without agoraphobia: Secondary | ICD-10-CM | POA: Insufficient documentation

## 2013-05-11 DIAGNOSIS — M25519 Pain in unspecified shoulder: Secondary | ICD-10-CM | POA: Insufficient documentation

## 2013-05-11 DIAGNOSIS — G8929 Other chronic pain: Secondary | ICD-10-CM

## 2013-05-11 DIAGNOSIS — IMO0001 Reserved for inherently not codable concepts without codable children: Secondary | ICD-10-CM | POA: Insufficient documentation

## 2013-05-11 DIAGNOSIS — F17200 Nicotine dependence, unspecified, uncomplicated: Secondary | ICD-10-CM | POA: Insufficient documentation

## 2013-05-11 DIAGNOSIS — E119 Type 2 diabetes mellitus without complications: Secondary | ICD-10-CM | POA: Insufficient documentation

## 2013-05-11 DIAGNOSIS — F319 Bipolar disorder, unspecified: Secondary | ICD-10-CM | POA: Insufficient documentation

## 2013-05-11 MED ORDER — TRAMADOL HCL 50 MG PO TABS
50.0000 mg | ORAL_TABLET | Freq: Four times a day (QID) | ORAL | Status: DC | PRN
Start: 2013-05-11 — End: 2013-08-25

## 2013-05-11 NOTE — ED Notes (Signed)
Pt c/o chronic r shoulder pain.  Denies injury.

## 2013-05-11 NOTE — ED Notes (Signed)
Pt with chronic right arm pain, states to have surgery on it sometime soon, states she has taken ASA and ibuprofen without relief

## 2013-05-13 NOTE — ED Provider Notes (Signed)
CSN: [id number]     Arrival date & time 05/11/13  1144 History   First MD Initiated Contact with Patient 05/11/13 1158     Chief Complaint  Patient presents with  . Shoulder Pain   (Consider location/radiation/quality/duration/timing/severity/associated sxs/prior Treatment) HPI Comments: [name] is a 47 y.o. Female with a diagnosis of right rotator cuff injury and is anticipating needing surgery in the near future, as recommended by her orthopedist Dr. Romeo Apple,  But she is delaying this as long as possible.  She reports worsened pain despite no new injury which has not responded to aspirin and ibuprofen.  She denies weakness or numbness in her arm, has had no fevers, rash, shortness of breath or chest pain.        The history is provided by the patient.    Past Medical History  Diagnosis Date  . Bipolar 1 disorder     pt denies, says was misdiagnosed  . Diabetes mellitus   . Fibromyalgia   . Anxiety   . Panic attack    Past Surgical History  Procedure Laterality Date  . Mandible surgery    . Cholecystectomy     Family History  Problem Relation Age of Onset  . Heart failure Mother   . Heart failure Other    History  Substance Use Topics  . Smoking status: Current Every Day Smoker -- 1.00 packs/day for 30 years    Types: Cigarettes  . Smokeless tobacco: Not on file  . Alcohol Use: No   OB History   Grav Para Term Preterm Abortions TAB SAB Ect Mult Living   4    2  2         Review of Systems  Constitutional: Negative for fever.  Musculoskeletal: Positive for arthralgias. Negative for joint swelling and myalgias.  Neurological: Negative for weakness and numbness.    Allergies  Cocaine; Procaine hcl; Aripiprazole; Cogentin; Geodon; and Haldol  Home Medications   Current Outpatient Rx  Name  Route  Sig  Dispense  Refill  . aspirin 325 MG tablet   Oral   Take 325 mg by mouth 2 (two) times daily as needed for pain.          . diphenhydrAMINE  (BENADRYL) 25 MG tablet   Oral   Take 50 mg by mouth 3 (three) times daily as needed for allergies.          . metFORMIN (GLUCOPHAGE) 500 MG tablet   Oral   Take 1 tablet (500 mg total) by mouth daily.         . Multiple Vitamin (MULTIVITAMIN WITH MINERALS) TABS   Oral   Take 1 tablet by mouth daily.         March Rummage (ALLERGY EYE OP)   Ophthalmic   Apply 2 drops to eye 6 (six) times daily.         . traMADol (ULTRAM) 50 MG tablet   Oral   Take 1 tablet (50 mg total) by mouth every 6 (six) hours as needed for pain.   20 tablet   0    BP 141/69  Pulse 89  Temp(Src) 97.5 F (36.4 C) (Oral)  Resp 18  Ht 5\' 1"  (1.549 m)  Wt 198 lb (89.812 kg)  BMI 37.43 kg/m2  SpO2 100% Physical Exam  Constitutional: She appears well-developed and well-nourished.  HENT:  Head: Atraumatic.  Neck: Normal range of motion.  Cardiovascular:  Pulses equal bilaterally  Musculoskeletal: She exhibits tenderness.  Right shoulder: She exhibits bony tenderness. She exhibits normal range of motion, no swelling, no effusion, no crepitus, no deformity and normal pulse.  Neurological: She is alert. She has normal strength. She displays normal reflexes. No sensory deficit.  Equal strength  Skin: Skin is warm and dry.  Psychiatric: She has a normal mood and affect.    ED Course  Procedures (including critical care time) Labs Review Labs Reviewed - No data to display Imaging Review No results found.  EKG Interpretation   None       MDM   1. Chronic shoulder pain, right    Pt small course of tramadol.  Encouraged f/u with her orthopedist.    The patient appears reasonably screened and/or stabilized for discharge and I doubt any other medical condition or other [facility] requiring further screening, evaluation, or treatment in the ED at this time prior to discharge.     Burgess Amor, PA-C 05/13/13 1410

## 2013-05-17 NOTE — ED Provider Notes (Signed)
Medical screening examination/treatment/procedure(s) were performed by non-physician practitioner and as supervising physician I was immediately available for consultation/collaboration.  EKG Interpretation   None         Kumar Falwell L Jaymon Dudek, MD 05/17/13 2114 

## 2013-08-25 ENCOUNTER — Emergency Department (HOSPITAL_COMMUNITY)
Admission: EM | Admit: 2013-08-25 | Discharge: 2013-08-25 | Disposition: A | Discharge status: Home / Self Care | Payer: Medicaid Other | Attending: Emergency Medicine | Admitting: Emergency Medicine

## 2013-08-25 ENCOUNTER — Encounter (HOSPITAL_COMMUNITY): Payer: Self-pay | Admitting: Emergency Medicine

## 2013-08-25 DIAGNOSIS — Z8659 Personal history of other mental and behavioral disorders: Secondary | ICD-10-CM | POA: Insufficient documentation

## 2013-08-25 DIAGNOSIS — IMO0001 Reserved for inherently not codable concepts without codable children: Secondary | ICD-10-CM | POA: Insufficient documentation

## 2013-08-25 DIAGNOSIS — F17200 Nicotine dependence, unspecified, uncomplicated: Secondary | ICD-10-CM | POA: Insufficient documentation

## 2013-08-25 DIAGNOSIS — M549 Dorsalgia, unspecified: Secondary | ICD-10-CM | POA: Insufficient documentation

## 2013-08-25 DIAGNOSIS — E119 Type 2 diabetes mellitus without complications: Secondary | ICD-10-CM | POA: Insufficient documentation

## 2013-08-25 DIAGNOSIS — M25519 Pain in unspecified shoulder: Secondary | ICD-10-CM | POA: Insufficient documentation

## 2013-08-25 MED ORDER — HYDROCODONE-ACETAMINOPHEN 5-325 MG PO TABS: 1.0000 | ORAL_TABLET | Freq: Four times a day (QID) | ORAL | Status: DC | PRN

## 2013-08-25 MED ORDER — IBUPROFEN 800 MG PO TABS: 800.0000 mg | ORAL_TABLET | Freq: Three times a day (TID) | ORAL | Status: DC | PRN

## 2013-08-25 MED ORDER — CYCLOBENZAPRINE HCL 10 MG PO TABS: 10.0000 mg | ORAL_TABLET | Freq: Three times a day (TID) | ORAL | Status: DC | PRN

## 2013-08-25 NOTE — Discharge Instructions (Signed)
Return here as needed. Follow up with your doctor or the one provided.

## 2013-08-25 NOTE — ED Notes (Addendum)
Patient c/o left shoulder pain that started after waking this morning. Per patient no known injury. Patient reports "spasms" in left shoulder. Patient states pain is worse with deep breath. Denies any chest pain.

## 2013-08-25 NOTE — ED Notes (Signed)
Pain lt post thorax, and lt  Shoulder. No injury . Good radial pulse and distal sensation.

## 2013-08-25 NOTE — ED Provider Notes (Signed)
CSN: [id number]     Arrival date & time 08/25/13  1212 History   First MD Initiated Contact with Patient 08/25/13 1258     Chief Complaint  Patient presents with  . Shoulder Pain   (Consider location/radiation/quality/duration/timing/severity/associated sxs/prior Treatment) HPI  [name] is a 48 yo female who presents with 3 hours of left sided back pain. It is located along the spine and is described as a constant muscle-like spasm. It is worse with movement and inspiration. The only associated symptom is slight SOB. She denies any recent trauma, fever, chills, diaphoresis, abdominal pain, or N/V/C/D. She has never had an event like this before and hasnt recently been lifting more. Her PMH is significant for fibromyalgia.  Past Medical History  Diagnosis Date  . Bipolar 1 disorder     pt denies, says was misdiagnosed  . Diabetes mellitus   . Fibromyalgia   . Anxiety   . Panic attack    Past Surgical History  Procedure Laterality Date  . Mandible surgery    . Cholecystectomy     Family History  Problem Relation Age of Onset  . Heart failure Mother   . Heart failure Other    History  Substance Use Topics  . Smoking status: Current Every Day Smoker -- 1.00 packs/day for 30 years    Types: Cigarettes  . Smokeless tobacco: Never Used  . Alcohol Use: No   OB History   Grav Para Term Preterm Abortions TAB SAB Ect Mult Living   4    2  2         Review of Systems All other systems negative except as documented in the HPI. All pertinent positives and negatives as reviewed in the HPI. Allergies  Cocaine; Procaine hcl; Aripiprazole; Cogentin; Geodon; and Haldol  Home Medications   Current Outpatient Rx  Name  Route  Sig  Dispense  Refill  . cyclobenzaprine (FLEXERIL) 10 MG tablet   Oral   Take 1 tablet (10 mg total) by mouth 3 (three) times daily as needed for muscle spasms.   15 tablet   0   . HYDROcodone-acetaminophen (NORCO/VICODIN) 5-325 MG per tablet  Oral   Take 1 tablet by mouth every 6 (six) hours as needed for moderate pain.   15 tablet   0   . ibuprofen (ADVIL,MOTRIN) 800 MG tablet   Oral   Take 1 tablet (800 mg total) by mouth every 8 (eight) hours as needed.   21 tablet   0    BP 151/72  Pulse 90  Temp(Src) 97.5 F (36.4 C) (Oral)  Resp 20  Ht 5\' 4"  (1.626 m)  Wt 200 lb (90.719 kg)  BMI 34.31 kg/m2  SpO2 98% Physical Exam  Constitutional: She is oriented to person, place, and time. She appears well-developed and well-nourished. No distress.  HENT:  Head: Normocephalic and atraumatic.  Neck: Normal range of motion.  Cardiovascular: Normal rate, regular rhythm and intact distal pulses.  Exam reveals no gallop.   No murmur heard. Distant heart sounds, likely due to body habitus  Pulmonary/Chest: Effort normal and breath sounds normal. No respiratory distress. She has no wheezes. She has no rales.  Musculoskeletal: Normal range of motion.  Left thoracic paraspinal TTP. Left shoulder also TTP. Full ROM of neck and shoulders bilaterally. No swelling, induration, calor or rubor. Left arm strength evaluation was limited due to pain.  Neurological: She is alert and oriented to person, place, and time. No sensory deficit.  Skin:  She is not diaphoretic.    ED Course  Procedures (including critical care time) Labs Review Labs Reviewed - No data to display Imaging Review No results found.  EKG Interpretation   None       MDM   1. Shoulder joint pain    [name] presents with left sided paraspinal "muscle spasm" and left shoulder tenderness.   Patient does not have any neurological deficits and he should be given pain control.  Told to return here as needed  Brent General, PA-C 08/27/13 1643

## 2013-09-02 NOTE — ED Provider Notes (Signed)
Medical screening examination/treatment/procedure(s) were performed by non-physician practitioner and as supervising physician I was immediately available for consultation/collaboration.  EKG Interpretation   None         Mervin Kung, MD 09/02/13 1600

## 2013-10-29 ENCOUNTER — Encounter (HOSPITAL_COMMUNITY): Payer: Self-pay | Admitting: Emergency Medicine

## 2013-10-29 ENCOUNTER — Emergency Department (HOSPITAL_COMMUNITY)
Admission: EM | Admit: 2013-10-29 | Discharge: 2013-10-29 | Disposition: A | Discharge status: Home / Self Care | Payer: Medicaid Other | Attending: Emergency Medicine | Admitting: Emergency Medicine

## 2013-10-29 ENCOUNTER — Emergency Department (HOSPITAL_COMMUNITY): Payer: Medicaid Other

## 2013-10-29 DIAGNOSIS — F17200 Nicotine dependence, unspecified, uncomplicated: Secondary | ICD-10-CM | POA: Insufficient documentation

## 2013-10-29 DIAGNOSIS — Z8659 Personal history of other mental and behavioral disorders: Secondary | ICD-10-CM | POA: Insufficient documentation

## 2013-10-29 DIAGNOSIS — R079 Chest pain, unspecified: Secondary | ICD-10-CM

## 2013-10-29 DIAGNOSIS — E119 Type 2 diabetes mellitus without complications: Secondary | ICD-10-CM | POA: Insufficient documentation

## 2013-10-29 DIAGNOSIS — M549 Dorsalgia, unspecified: Secondary | ICD-10-CM | POA: Insufficient documentation

## 2013-10-29 DIAGNOSIS — Z3202 Encounter for pregnancy test, result negative: Secondary | ICD-10-CM | POA: Insufficient documentation

## 2013-10-29 DIAGNOSIS — G8929 Other chronic pain: Secondary | ICD-10-CM | POA: Insufficient documentation

## 2013-10-29 DIAGNOSIS — R110 Nausea: Secondary | ICD-10-CM | POA: Insufficient documentation

## 2013-10-29 LAB — URINALYSIS, ROUTINE W REFLEX MICROSCOPIC
Bilirubin Urine: NEGATIVE
GLUCOSE, UA: NEGATIVE mg/dL
HGB URINE DIPSTICK: NEGATIVE
Ketones, ur: NEGATIVE mg/dL
LEUKOCYTES UA: NEGATIVE
Nitrite: NEGATIVE
PH: 7 (ref 5.0–8.0)
PROTEIN: NEGATIVE mg/dL
Urobilinogen, UA: 0.2 mg/dL (ref 0.0–1.0)

## 2013-10-29 LAB — POC URINE PREG, ED: PREG TEST UR: NEGATIVE

## 2013-10-29 MED ORDER — OXYCODONE-ACETAMINOPHEN 5-325 MG PO TABS
1.0000 | ORAL_TABLET | Freq: Once | ORAL | Status: AC
  Administered 2013-10-29: 1 via ORAL
  Filled 2013-10-29: qty 1

## 2013-10-29 MED ORDER — IBUPROFEN 800 MG PO TABS
800.0000 mg | ORAL_TABLET | Freq: Once | ORAL | Status: AC
  Administered 2013-10-29: 800 mg via ORAL
  Filled 2013-10-29: qty 1

## 2013-10-29 MED ORDER — IBUPROFEN 800 MG PO TABS: 800.0000 mg | ORAL_TABLET | Freq: Three times a day (TID) | ORAL | Status: DC
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
## 2013-10-29 NOTE — Discharge Instructions (Signed)
Your caregiver has diagnosed you as having chest pain that is not specific for one problem, but does not require admission.  Chest pain comes from many different causes.  °SEEK IMMEDIATE MEDICAL ATTENTION IF: °You have severe chest pain, especially if the pain is crushing or pressure-like and spreads to the arms, back, neck, or jaw, or if you have sweating, nausea (feeling sick to your stomach), or shortness of breath. THIS IS AN EMERGENCY. Don't wait to see if the pain will go away. Get medical help at once. Call 911 or 0 (operator). DO NOT drive yourself to the hospital.  °Your chest pain gets worse and does not go away with rest.  °You have an attack of chest pain lasting longer than usual, despite rest and treatment with the medications your caregiver has prescribed.  °You wake from sleep with chest pain or shortness of breath.  °You feel dizzy or faint.  °You have chest pain not typical of your usual pain for which you originally saw your caregiver. ° ° °SEEK IMMEDIATE MEDICAL ATTENTION IF: °New numbness, tingling, weakness, or problem with the use of your arms or legs.  °Severe back pain not relieved with medications.  °Change in bowel or bladder control (if you lose control of stool or urine, or if you are unable to urinate) °Increasing pain in any areas of the body (such as chest or abdominal pain).  °Shortness of breath, dizziness or fainting.  °Nausea (feeling sick to your stomach), vomiting, fever, or sweats. ° °

## 2013-10-29 NOTE — ED Notes (Signed)
Pt c/o back pain "all over back" since yesterday.  Denies injury.

## 2013-10-29 NOTE — ED Notes (Signed)
Pt mentions that she has chest pain every day, reports has chest pain at this time but says is not new.

## 2013-11-29 IMAGING — CR DG LUMBAR SPINE COMPLETE 4+V
5 series · 5 of 5 positions shown · non-contrast
Comparison: 12/15/2008

CLINICAL DATA: Low back pain

LUMBAR SPINE - COMPLETE 4+ VIEW

[view not recorded (1 of 5)]
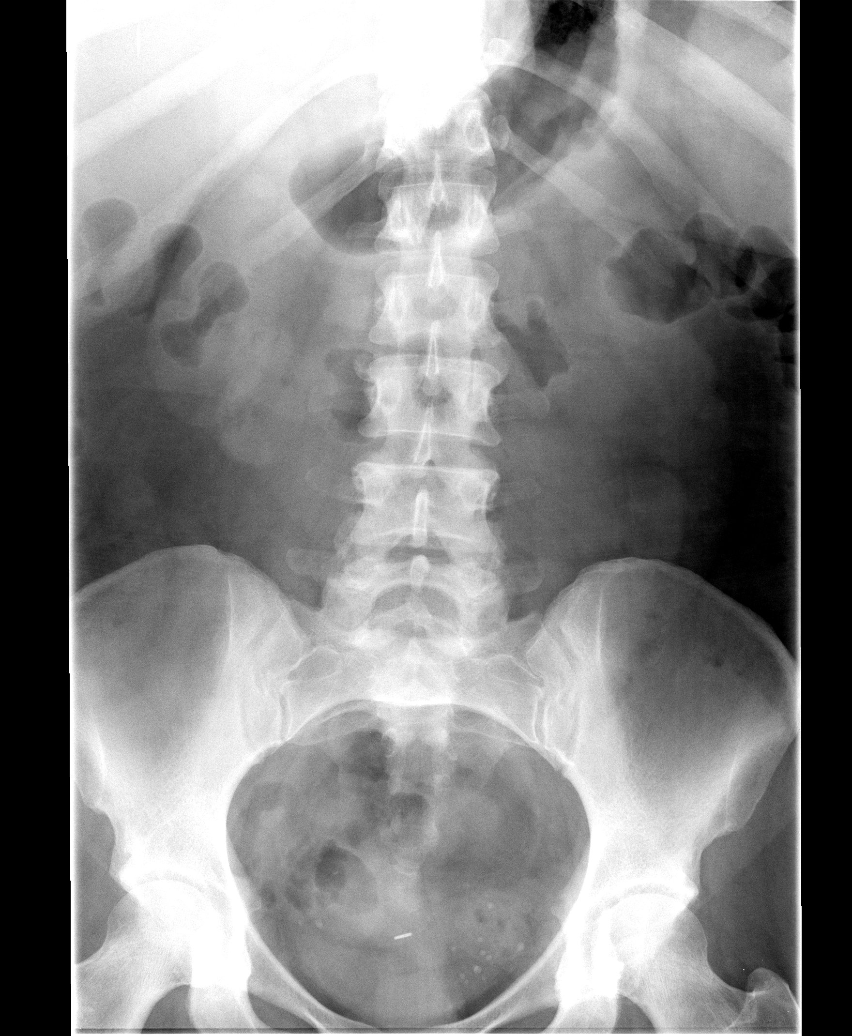

[view not recorded (2 of 5)]
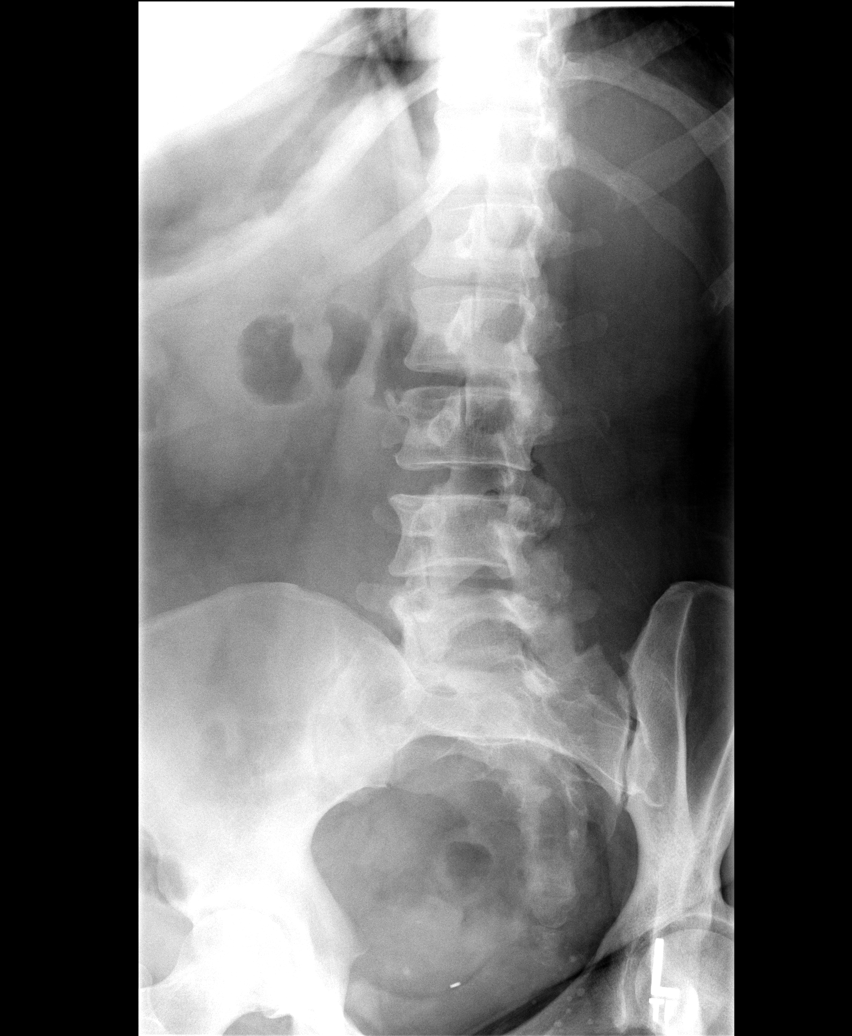

[view not recorded (3 of 5)]
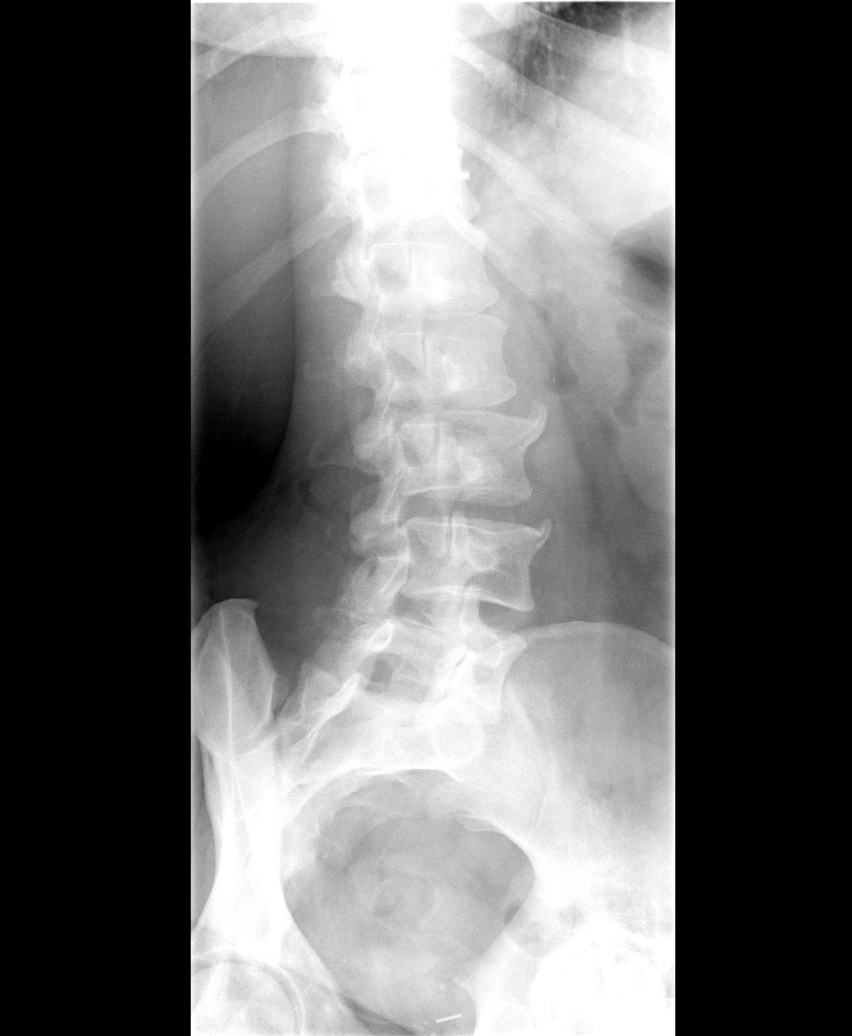

[view not recorded (4 of 5)]
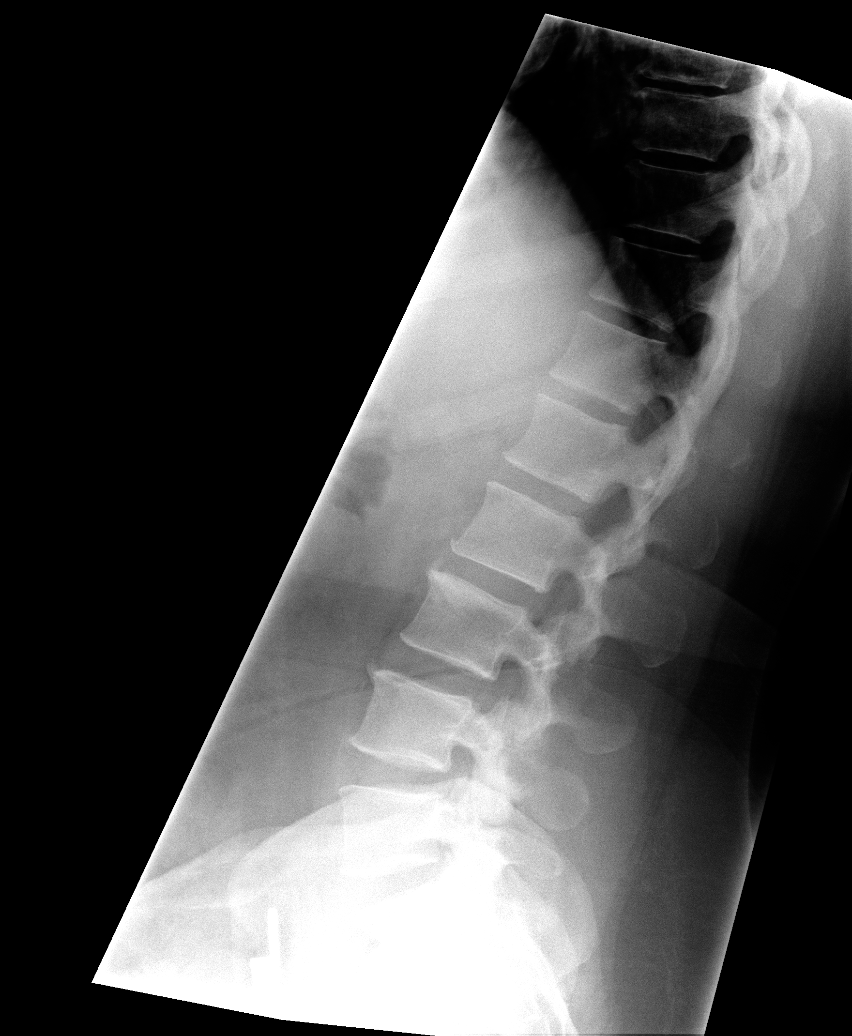

[view not recorded (5 of 5)]
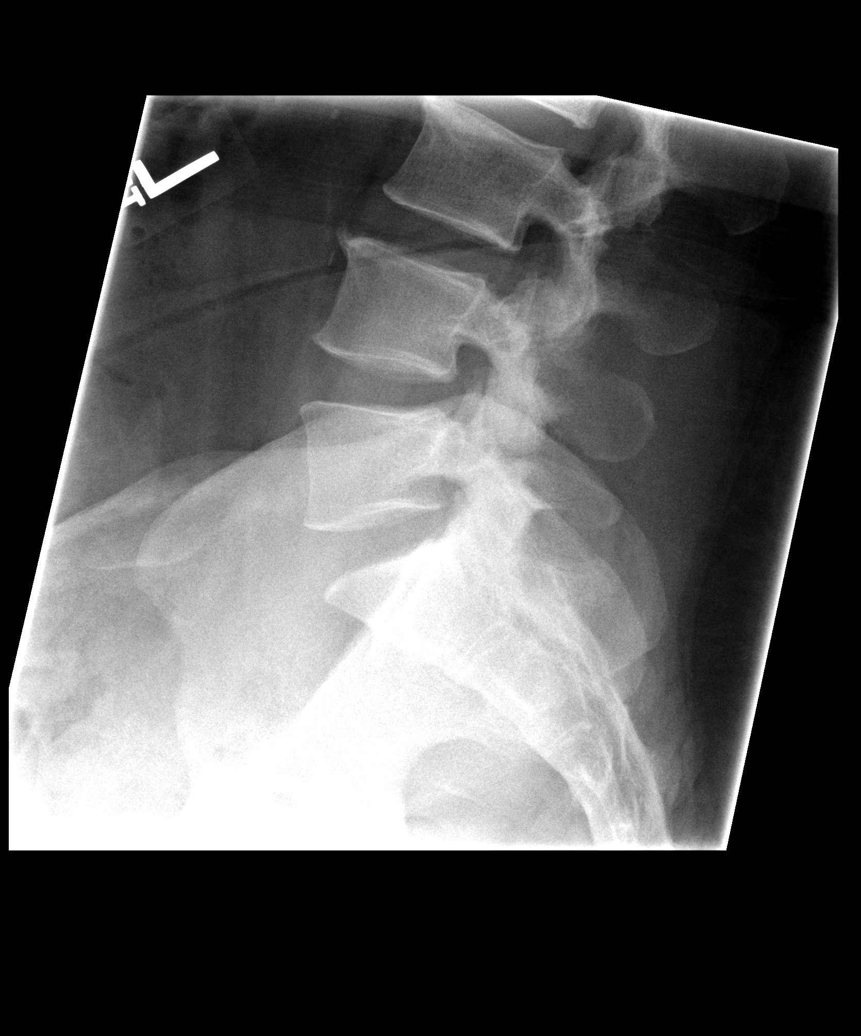

[5 of 5 positions shown; findings below may reference images not displayed]

FINDINGS: Negative for fracture.  Normal alignment.  Mild anterior
spurring L2-3 and L3-4.  Disc spaces are maintained.  No pars
defect.
IMPRESSION: Mild disc degeneration and spurring L2-3 and L3-4.  No acute bony
abnormality.

## 2014-01-24 ENCOUNTER — Emergency Department (EMERGENCY_DEPARTMENT_HOSPITAL)
Admission: EM | Admit: 2014-01-24 | Discharge: 2014-01-25 | Disposition: A | Discharge status: Other Acute Inpatient Hospital | Payer: MEDICAID | Source: Home / Self Care | Attending: Emergency Medicine | Admitting: Emergency Medicine

## 2014-01-24 ENCOUNTER — Emergency Department (HOSPITAL_COMMUNITY): Payer: MEDICAID

## 2014-01-24 ENCOUNTER — Encounter (HOSPITAL_COMMUNITY): Payer: Self-pay | Admitting: Emergency Medicine

## 2014-01-24 DIAGNOSIS — F259 Schizoaffective disorder, unspecified: Secondary | ICD-10-CM | POA: Insufficient documentation

## 2014-01-24 DIAGNOSIS — F250 Schizoaffective disorder, bipolar type: Secondary | ICD-10-CM

## 2014-01-24 DIAGNOSIS — F29 Unspecified psychosis not due to a substance or known physiological condition: Secondary | ICD-10-CM | POA: Insufficient documentation

## 2014-01-24 DIAGNOSIS — F1210 Cannabis abuse, uncomplicated: Secondary | ICD-10-CM

## 2014-01-24 DIAGNOSIS — F410 Panic disorder [episodic paroxysmal anxiety] without agoraphobia: Secondary | ICD-10-CM | POA: Insufficient documentation

## 2014-01-24 DIAGNOSIS — F411 Generalized anxiety disorder: Secondary | ICD-10-CM | POA: Insufficient documentation

## 2014-01-24 DIAGNOSIS — E119 Type 2 diabetes mellitus without complications: Secondary | ICD-10-CM | POA: Insufficient documentation

## 2014-01-24 DIAGNOSIS — F319 Bipolar disorder, unspecified: Secondary | ICD-10-CM | POA: Insufficient documentation

## 2014-01-24 DIAGNOSIS — IMO0001 Reserved for inherently not codable concepts without codable children: Secondary | ICD-10-CM | POA: Insufficient documentation

## 2014-01-24 DIAGNOSIS — R0789 Other chest pain: Secondary | ICD-10-CM

## 2014-01-24 DIAGNOSIS — G8929 Other chronic pain: Secondary | ICD-10-CM

## 2014-01-24 HISTORY — DX: Other chronic pain: G89.29

## 2014-01-24 HISTORY — DX: Other chest pain: R07.89

## 2014-01-24 HISTORY — DX: Dorsalgia, unspecified: M54.9

## 2014-01-24 HISTORY — DX: Pain in right shoulder: M25.511

## 2014-01-24 HISTORY — DX: Schizoaffective disorder, unspecified: F25.9

## 2014-01-24 LAB — I-STAT TROPONIN, ED: Troponin i, poc: 0 ng/mL (ref 0.00–0.08)

## 2014-01-24 LAB — CBC WITH DIFFERENTIAL/PLATELET
BASOS PCT: 1 % (ref 0–1)
Basophils Absolute: 0 10*3/uL (ref 0.0–0.1)
Eosinophils Absolute: 0.1 10*3/uL (ref 0.0–0.7)
Eosinophils Relative: 1 % (ref 0–5)
HCT: 37.7 % (ref 36.0–46.0)
HEMOGLOBIN: 13.4 g/dL (ref 12.0–15.0)
Lymphocytes Relative: 27 % (ref 12–46)
Lymphs Abs: 1.7 10*3/uL (ref 0.7–4.0)
MCH: 32.9 pg (ref 26.0–34.0)
MCHC: 35.5 g/dL (ref 30.0–36.0)
MCV: 92.6 fL (ref 78.0–100.0)
Monocytes Absolute: 0.4 10*3/uL (ref 0.1–1.0)
Monocytes Relative: 7 % (ref 3–12)
NEUTROS PCT: 65 % (ref 43–77)
Neutro Abs: 4.2 10*3/uL (ref 1.7–7.7)
Platelets: 137 10*3/uL — ABNORMAL LOW (ref 150–400)
RBC: 4.07 MIL/uL (ref 3.87–5.11)
RDW: 13 % (ref 11.5–15.5)
WBC: 6.4 10*3/uL (ref 4.0–10.5)

## 2014-01-24 LAB — BASIC METABOLIC PANEL
BUN: 4 mg/dL — AB (ref 6–23)
CALCIUM: 9 mg/dL (ref 8.4–10.5)
CO2: 25 meq/L (ref 19–32)
Chloride: 102 mEq/L (ref 96–112)
Creatinine, Ser: 0.62 mg/dL (ref 0.50–1.10)
GFR calc Af Amer: >90 mL/min (ref >90)
GLUCOSE: 102 mg/dL — AB (ref 70–99)
POTASSIUM: 3.6 meq/L — AB (ref 3.7–5.3)
Sodium: 140 mEq/L (ref 137–147)

## 2014-01-24 LAB — I-STAT CHEM 8, ED
BUN: <3 mg/dL — ABNORMAL LOW (ref 6–23)
CHLORIDE: 108 meq/L (ref 96–112)
Calcium, Ion: 1.13 mmol/L (ref 1.12–1.23)
Creatinine, Ser: 0.6 mg/dL (ref 0.50–1.10)
Glucose, Bld: 95 mg/dL (ref 70–99)
HCT: 36 % (ref 36.0–46.0)
Hemoglobin: 12.2 g/dL (ref 12.0–15.0)
Potassium: 3.6 mEq/L — ABNORMAL LOW (ref 3.7–5.3)
SODIUM: 140 meq/L (ref 137–147)
TCO2: 27 mmol/L (ref 0–100)

## 2014-01-24 LAB — RAPID URINE DRUG SCREEN, HOSP PERFORMED
AMPHETAMINES: NOT DETECTED
Barbiturates: NOT DETECTED
Benzodiazepines: NOT DETECTED
COCAINE: NOT DETECTED
OPIATES: NOT DETECTED
Tetrahydrocannabinol: POSITIVE — AB

## 2014-01-24 LAB — ETHANOL: Alcohol, Ethyl (B): <11 mg/dL (ref 0–11)

## 2014-01-24 IMAGING — CR DG CHEST 2V
2 series · 2 of 2 positions shown · non-contrast
Comparison: 04/12/2012

CLINICAL DATA: Chest pain for 5 days with right arm pain.

CHEST - 2 VIEW

[view not recorded (1 of 2)]
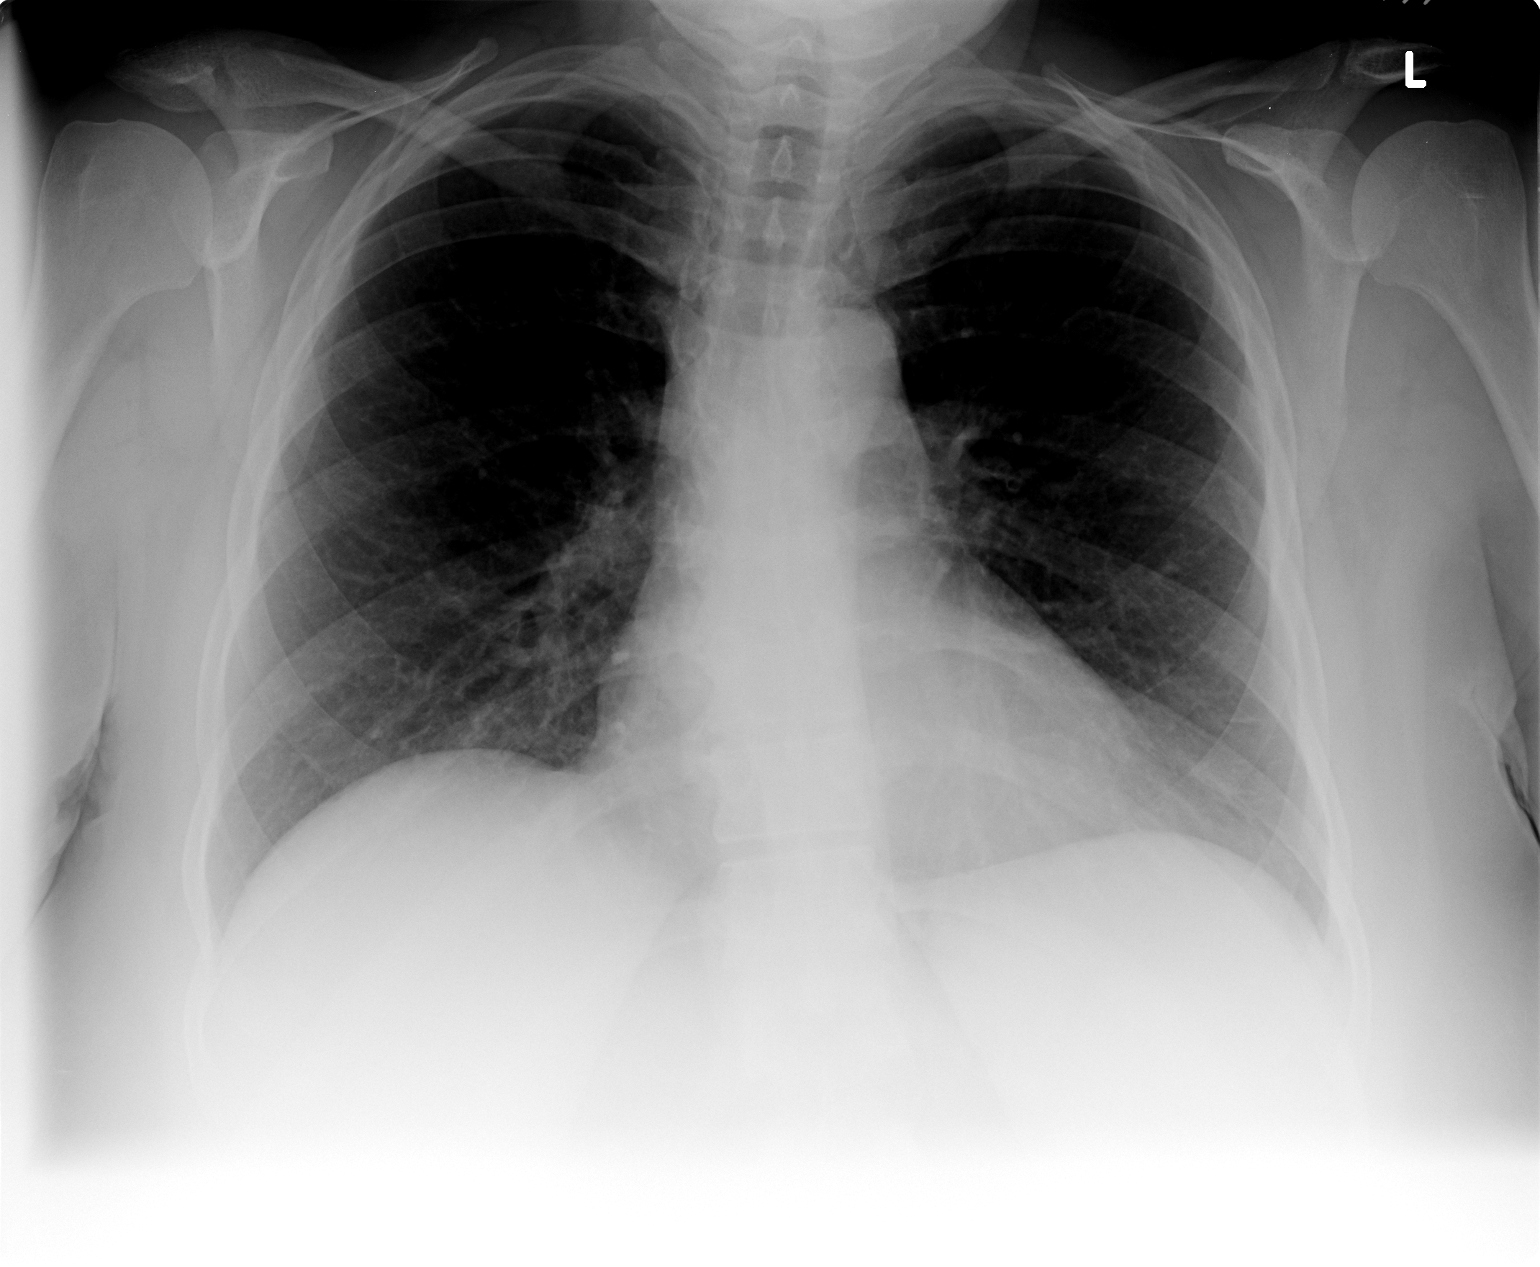

[view not recorded (2 of 2)]
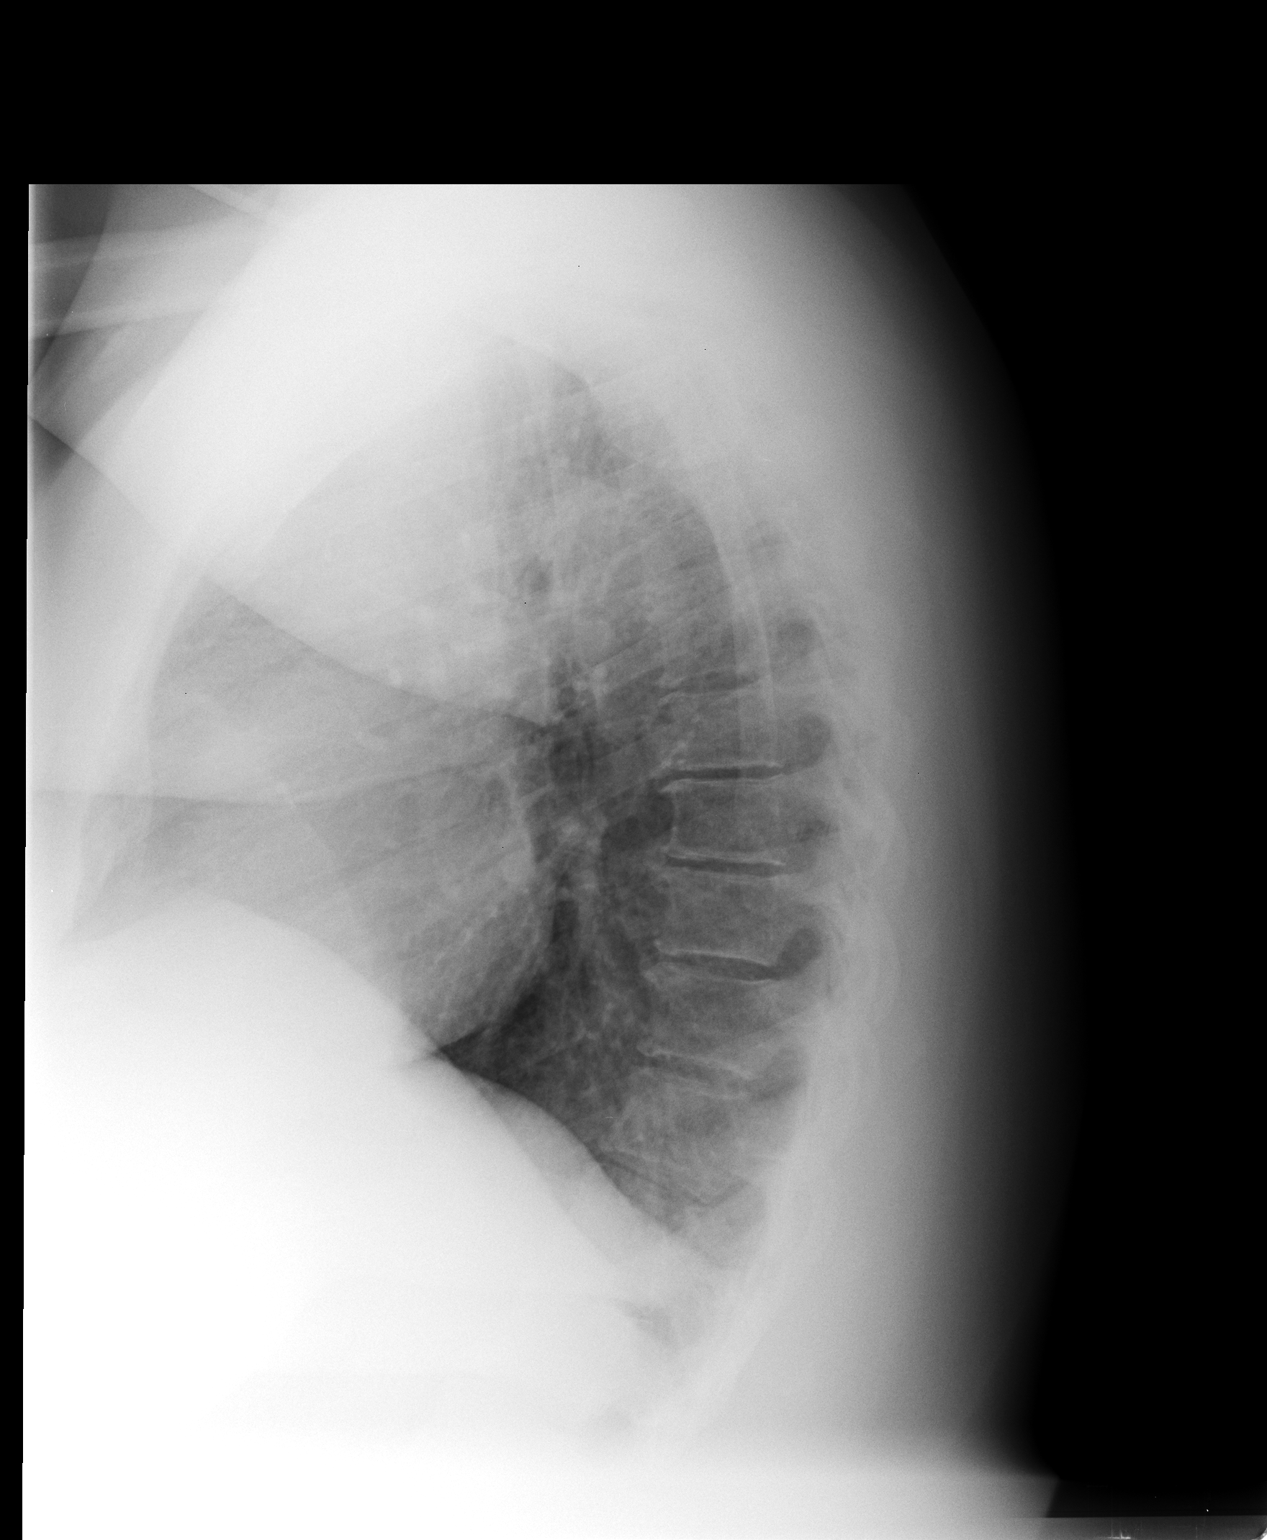

[2 of 2 positions shown; findings below may reference images not displayed]

FINDINGS: Lateral view degraded by patient arm position.

Mild mid thoracic spondylosis. Midline trachea.  Normal heart size
and mediastinal contours. No pleural effusion or pneumothorax.  No
congestive failure.

Clear lungs.
IMPRESSION: No acute cardiopulmonary disease.

## 2014-01-24 MED ORDER — ALUM & MAG HYDROXIDE-SIMETH 200-200-20 MG/5ML PO SUSP
30.0000 mL | ORAL | Status: DC | PRN
  Administered 2014-01-24: 30 mL via ORAL
  Filled 2014-01-24: qty 30

## 2014-01-24 MED ORDER — IBUPROFEN 400 MG PO TABS
400.0000 mg | ORAL_TABLET | Freq: Once | ORAL | Status: AC
  Administered 2014-01-24: 400 mg via ORAL
  Filled 2014-01-24: qty 1

## 2014-01-24 MED ORDER — ACETAMINOPHEN 325 MG PO TABS
650.0000 mg | ORAL_TABLET | ORAL | Status: DC | PRN
  Administered 2014-01-24: 650 mg via ORAL
  Filled 2014-01-24: qty 2

## 2014-01-24 MED ORDER — IBUPROFEN 200 MG PO TABS
400.0000 mg | ORAL_TABLET | Freq: Three times a day (TID) | ORAL | Status: DC | PRN
Start: 2014-01-24 — End: 2014-01-25
  Administered 2014-01-25 (×2): 400 mg via ORAL
  Filled 2014-01-24 (×2): qty 2

## 2014-01-24 MED ORDER — ONDANSETRON HCL 4 MG PO TABS: 4.0000 mg | ORAL_TABLET | Freq: Three times a day (TID) | ORAL | Status: DC | PRN

## 2014-01-24 MED ORDER — NICOTINE 21 MG/24HR TD PT24
21.0000 mg | MEDICATED_PATCH | Freq: Every day | TRANSDERMAL | Status: DC | PRN
Start: 2014-01-24 — End: 2014-01-25
  Filled 2014-01-24: qty 1

## 2014-01-24 MED ORDER — LORAZEPAM 1 MG PO TABS
1.0000 mg | ORAL_TABLET | Freq: Once | ORAL | Status: AC
  Administered 2014-01-24: 1 mg via ORAL
  Filled 2014-01-24: qty 1

## 2014-01-24 MED ORDER — LORAZEPAM 1 MG PO TABS
1.0000 mg | ORAL_TABLET | Freq: Three times a day (TID) | ORAL | Status: DC | PRN
  Administered 2014-01-25 (×2): 1 mg via ORAL
  Filled 2014-01-24 (×2): qty 1

## 2014-01-24 NOTE — BH Assessment (Signed)
[facility] Assessment Progress Note  At 17:52 I spoke to EDP Dr Thurnell Garbe in anticipation of TTS assessment.  Jalene Mullet, MA Triage Specialist 01/24/2014 @ 17:58

## 2014-01-24 NOTE — ED Notes (Signed)
Pt c/o right side cp since this am with some sob/lightheadedness.

## 2014-01-24 NOTE — ED Notes (Signed)
Sitter remains at bedside

## 2014-01-24 NOTE — ED Notes (Signed)
Pt transported to [facility] ED by [facility].
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
## 2014-01-24 NOTE — ED Notes (Signed)
Pt having flight of ideas notified Dr. Thurnell Garbe. Pt continues to deny any self harm or harm to others. Denies hearing voices.
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
## 2014-01-25 ENCOUNTER — Inpatient Hospital Stay (HOSPITAL_COMMUNITY)
Admission: AD | Admit: 2014-01-25 | Discharge: 2014-01-30 | DRG: 885 | Disposition: A | Discharge status: Home / Self Care | Payer: MEDICAID | Source: Intra-hospital | Attending: Psychiatry | Admitting: Psychiatry

## 2014-01-25 ENCOUNTER — Encounter (HOSPITAL_COMMUNITY): Payer: Self-pay

## 2014-01-25 ENCOUNTER — Encounter (HOSPITAL_COMMUNITY): Payer: Self-pay | Admitting: Registered Nurse

## 2014-01-25 DIAGNOSIS — F410 Panic disorder [episodic paroxysmal anxiety] without agoraphobia: Secondary | ICD-10-CM | POA: Diagnosis present

## 2014-01-25 DIAGNOSIS — Z23 Encounter for immunization: Secondary | ICD-10-CM

## 2014-01-25 DIAGNOSIS — F29 Unspecified psychosis not due to a substance or known physiological condition: Secondary | ICD-10-CM

## 2014-01-25 DIAGNOSIS — Z9189 Other specified personal risk factors, not elsewhere classified: Secondary | ICD-10-CM

## 2014-01-25 DIAGNOSIS — F259 Schizoaffective disorder, unspecified: Principal | ICD-10-CM | POA: Diagnosis present

## 2014-01-25 DIAGNOSIS — J45909 Unspecified asthma, uncomplicated: Secondary | ICD-10-CM

## 2014-01-25 DIAGNOSIS — F17200 Nicotine dependence, unspecified, uncomplicated: Secondary | ICD-10-CM | POA: Diagnosis present

## 2014-01-25 DIAGNOSIS — F411 Generalized anxiety disorder: Secondary | ICD-10-CM | POA: Diagnosis present

## 2014-01-25 DIAGNOSIS — F319 Bipolar disorder, unspecified: Secondary | ICD-10-CM

## 2014-01-25 DIAGNOSIS — R079 Chest pain, unspecified: Secondary | ICD-10-CM | POA: Diagnosis present

## 2014-01-25 DIAGNOSIS — IMO0001 Reserved for inherently not codable concepts without codable children: Secondary | ICD-10-CM | POA: Diagnosis present

## 2014-01-25 DIAGNOSIS — G4700 Insomnia, unspecified: Secondary | ICD-10-CM | POA: Diagnosis present

## 2014-01-25 DIAGNOSIS — F250 Schizoaffective disorder, bipolar type: Secondary | ICD-10-CM

## 2014-01-25 DIAGNOSIS — F1210 Cannabis abuse, uncomplicated: Secondary | ICD-10-CM

## 2014-01-25 DIAGNOSIS — E119 Type 2 diabetes mellitus without complications: Secondary | ICD-10-CM | POA: Diagnosis present

## 2014-01-25 MED ORDER — PNEUMOCOCCAL VAC POLYVALENT 25 MCG/0.5ML IJ INJ
0.5000 mL | INJECTION | INTRAMUSCULAR | Status: AC
  Administered 2014-01-26: 0.5 mL via INTRAMUSCULAR

## 2014-01-25 MED ORDER — HYDROXYZINE HCL 25 MG PO TABS
25.0000 mg | ORAL_TABLET | Freq: Four times a day (QID) | ORAL | Status: DC | PRN
  Administered 2014-01-25 – 2014-01-29 (×6): 25 mg via ORAL
  Filled 2014-01-25 (×6): qty 1

## 2014-01-25 MED ORDER — OLANZAPINE 10 MG PO TBDP
10.0000 mg | ORAL_TABLET | Freq: Every day | ORAL | Status: AC
  Administered 2014-01-25: 10 mg via ORAL
  Filled 2014-01-25: qty 1

## 2014-01-25 MED ORDER — TRAZODONE HCL 50 MG PO TABS
50.0000 mg | ORAL_TABLET | Freq: Every evening | ORAL | Status: DC | PRN
  Administered 2014-01-25 – 2014-01-26 (×2): 50 mg via ORAL
  Filled 2014-01-25 (×10): qty 1

## 2014-01-25 MED ORDER — IBUPROFEN 200 MG PO TABS
600.0000 mg | ORAL_TABLET | Freq: Four times a day (QID) | ORAL | Status: DC | PRN
  Administered 2014-01-25: 600 mg via ORAL
  Filled 2014-01-25: qty 3

## 2014-01-25 MED ORDER — HYDROCORTISONE 1 % EX CREA
TOPICAL_CREAM | Freq: Three times a day (TID) | CUTANEOUS | Status: DC
  Administered 2014-01-25 – 2014-01-30 (×10): via TOPICAL
  Filled 2014-01-25 (×3): qty 28

## 2014-01-25 MED ORDER — MAGNESIUM HYDROXIDE 400 MG/5ML PO SUSP: 30.0000 mL | Freq: Every day | ORAL | Status: DC | PRN
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
## 2014-01-25 NOTE — Discharge Instructions (Signed)
Hallucinations and Delusions You seem to be having hallucinations and/or delusions. You may be hearing voices that no one else can hear. This can seem very real to you. You may be having thoughts and fears that do not make sense to others. This condition can be due to mental disease like schizophrenia. It may be caused by a medical condition, such as an infection or electrolyte disturbance. These symptoms are also seen in drug abusers, especially those who use crack cocaine and amphetamines. Drugs like PCP, LSD, MDMA, peyote, and psilocybin can also cause frightening hallucinations and loss of control. If your symptoms are due to drug abuse, your mental state should improve as the drug(s) leave your system. Someone you trust should be with you until you are better to protect you and calm your fears. Often tranquilizers are very helpful at controlling hallucinations, anxiety, and destructive behavior. Getting a proper diet and enough sleep is important to recovery. If your symptoms are not due to drugs, or do not improve over several days after stopping drug use, you need further medical or mental health care. SEEK IMMEDIATE MEDICAL CARE IF:   Your symptoms get worse, especially if you think your life is in danger  You have violent or destructive thoughts. Recovery is possible, but you have to get proper treatment and avoid drugs that are known to cause you trouble. Document Released: 08/21/2004 Document Revised: 10/06/2011 Document Reviewed: 07/14/2005 [facility] Patient Information 2015 [place], [place]. This information is not intended to replace advice given to you by your health care provider. Make sure you discuss any questions you have with your health care provider.  Marijuana Abuse Your exam shows you have used marijuana or pot. There are many health problems related to marijuana abuse. These include:  Bronchitis.  Chronic cough.  Emphysema.  Lung and upper airway cancer. Abusers also  experience impairment in:  Memory.  Judgment.  Ability to learn.  Coordination. Students who smoke marijuana:  Get lower grades.  Are less likely to graduate than those who do not. Adults who abuse marijuana:  Have problems at work.  May even lose their jobs due to:  Poor work Systems analyst.  Absenteeism. Attention, memory, and learning skills have been shown to be diminished for up to 6 months after stopping regular use, and there is evidence that the effects can be cumulative over a lifetime.  Heavier use of marijuana also puts a strain on relationships with friends and loved ones and can lead to moodiness and loss of confidence. Acute intoxication can lead to:  Increased anxiety.  A panic episode. It also increases the risk for having an automobile accident. This is especially true if the pot is combined with alcohol or other intoxicants. Treatment for acute intoxication is rarely needed. However, medicine to reduce anxiety may be helpful in some people. Millions of people are considered to be dependent on marijuana. It is long-term regular use that leads to addiction and all of its complex problems. Information on the problem of addiction and the health problems of long-term abuse is posted at the [facility] website, motorcyclefax.com. Consult with your doctor or counselor if you want further information and support in handling this common problem. Document Released: 08/21/2004 Document Revised: 10/06/2011 Document Reviewed: 06/08/2007 [facility] Patient Information 2015 [place], [place]. This information is not intended to replace advice given to you by your health care provider. Make sure you discuss any questions you have with your health care provider.  Schizoaffective Disorder Schizoaffective  disorder (ScAD) is a mental illness. It causes symptoms that are a mixture of schizophrenia (a psychotic disorder) and an affective (mood) disorder. The  schizophrenic symptoms may include delusions, hallucinations, or odd behavior. The mood symptoms may be similar to major depression or bipolar disorder. ScAD may interfere with personal relationships or normal daily activities. People with ScAD are at increased risk for job loss, social isolation,physical health problems, anxiety and substance use disorders, and suicide. ScAD usually occurs in cycles. Periods of severe symptoms are followed by periods of less severe symptoms or improvement. The illness affects men and women equally but usually appears at an earlier age (teenage or early adult years) in men. People who have family members with schizophrenia, bipolar disorder, or ScAD are at higher risk of developing ScAD. SYMPTOMS  At any one time, people with ScAD may have psychotic symptoms only or both psychotic and mood symptoms. The psychotic symptoms include one or more of the following:  Hearing, seeing, or feeling things that are not there (hallucinations).   Having fixed, false beliefs (delusions). The delusions usually are of being attacked, harassed, cheated, persecuted, or conspired against (paranoid delusions).  Speaking in a way that makes no sense to others (disorganized speech). The psychotic symptoms of ScAD may also include confusing or odd behavior or any of the negative symptoms of schizophrenia. These include loss of motivation for normal daily activities, such as bathing or grooming, withdrawal from other people, and lack of emotions.  The mood symptoms of ScAD occur more often than not. They resemble major depressive disorder or bipolar mania. Symptoms of major depression include depressed mood and four or more of the following:  Loss of interest in usually pleasurable activities (anhedonia).  Sleeping more or less than normal.  Feeling worthless or excessively guilty.  Lack of energy or motivation.  Trouble concentrating.  Eating more or less than usual.  Thinking  a lot about death or suicide. Symptoms of bipolar mania include abnormally elevated or irritable mood and increased energy or activity, plus three or more of the following:   More confidence than normal or feeling that you are able to do anything (grandiosity).  Feeling rested with less sleep than normal.   Being easily distracted.   Talking more than usual or feeling pressured to keep talking.   Feeling that your thoughts are racing.  Engaging in high-risk activities such as buying sprees or foolish business decisions. DIAGNOSIS  ScAD is diagnosed through an assessment by your health care provider. Your health care provider will observe and ask questions about your thoughts, behavior, mood, and ability to function in daily life. Your health care provider may also ask questions about your medical history and use of drugs, including prescription medicines. Your health care provider may also order blood tests and imaging exams. Certain medical conditions and substances can cause symptoms that resemble ScAD. Your health care provider may refer you to a mental health specialist for evaluation.  ScAD is divided into two types. The depressive type is diagnosed if your mood symptoms are limited to major depression. The bipolar type is diagnosed if your mood symptoms are manic or a mixture of manic and depressive symptoms TREATMENT  ScAD is usually a life-long illness. Long-term treatment is necessary. The following treatments are available:  Medicine--Different types of medicine are used to treat ScAD. The exact combination depends on the type and severity of your symptoms. Antipsychotic medicine is used to control psychotic symptomssuch as delusions, paranoia, and hallucinations. Mood  stabilizers can even the highs and lows of bipolar manic mood swings. Antidepressant medicines are used to treat major depressive symptoms.  Counseling or talk therapy--Individual, group, or family counseling may  be helpful in providing education, support, and guidance. Many people with ScAD also benefit from social skills and job skills (vocational) training. A combination of medicine and counseling is usually best for managing the disorder over time. A procedure in which electricity is applied to the brain through the scalp (electroconvulsive therapy) may be used to treat people with severe manic symptoms that do not respond to medicine and counseling. HOME CARE INSTRUCTIONS   Take all your medicine as prescribed.  Check with your health care provider before starting new prescription or over-the-counter medicines.  Keep all follow up appointments with your health care provider. SEEK MEDICAL CARE IF:   If you are not able to take your medicines as prescribed.  If your symptoms get worse. SEEK IMMEDIATE MEDICAL CARE IF:   You have serious thoughts about hurting yourself or others. Document Released: 11/24/2006 Document Revised: 05/04/2013 Document Reviewed: 02/25/2013 [facility] Patient Information 2015 [place], [place]. This information is not intended to replace advice given to you by your health care provider. Make sure you discuss any questions you have with your health care provider.  Psychosis Psychosis refers to a severe lack of understanding with reality. During a psychotic episode, you are not able to think clearly. During a psychotic episode, your responses and emotions are inappropriate and do not coincide with what is actually happening. You often have false beliefs about what is happening or who you are (delusions), and you may see, hear, taste, smell, or feel things that are not present (hallucinations). Psychosis is usually a severe symptom of a very serious mental health (psychiatric) condition, but it can sometimes be the result of a medical condition. CAUSES   Psychiatric conditions, such as:  Schizophrenia.  Bipolar disorder.  Depression.  Personality disorders.  Alcohol or  drug abuse.  Medical conditions, such as:  Brain injury.  Brain tumor.  Dementia.  Brain diseases, such as Alzheimer's, Parkinson's, or Huntington's disease.  Neurological diseases, such as epilepsy.  Genetic disorders.  Metabolic disorders.  Infections that affect the brain.  Certain prescription drugs.  Stroke. SYMPTOMS   Unable to think or speak clearly or respond appropriately.  Disorganized thinking (thoughts jump from one thought to another).  Severe inappropriate behavior.  Delusions may include:  A strong belief that is odd, unrealistic, or false.  Feeling extremely fearful or suspicious (paranoid).  Believing you are someone else, have high importance, or have an altered identity.  Hallucinations. DIAGNOSIS   Mental health evaluation.  Physical exam.  Blood tests.  Computerized magnetic scan (MRI) or other brain scans. TREATMENT  Your caregiver will recommend a course of treatment that depends on the cause of the psychosis. Treatment may include:  Monitoring and supportive care in the hospital.  Taking medicines (antipsychotic medicine) to reduce symptoms and balance chemicals in the brain.  Taking medicines to manage underlying mental health conditions.  Therapy and other supportive programs outside of the hospital.  Treating an underlying medical condition. If the cause of the psychosis can be treated or corrected, the outlook is good. Without treatment, psychotic episodes can cause danger to yourself or others. Treatment may be short-term or lifelong. HOME CARE INSTRUCTIONS   Take all medicines as directed. This is important.  Use a pillbox or write down your medicine schedule to make sure you are taking  them.  Check with your caregiver before using over-the-counter medicines, herbs, or supplements.  Seek individual and family support through therapy and mental health education (psychoeducation) programs. These will help you manage  symptoms and side effects of medicines, learn life skills, and maintain a healthy routine.  Maintain a healthy lifestyle.  Exercise regularly.  Avoid alcohol and drugs.  Learn ways to reduce stress and cope with stress, such as yoga and meditation.  Talk about your feelings with family members or caregivers.  Make time for yourself to do things you enjoy.  Know the early warning signs of psychosis. Your caregiver will recommend steps to take when you notice symptoms such as:  Feeling anxious or preoccupied.  Having racing thoughts.  Changes in your interest in life and relationships.  Follow up with your caregivers for continued outpatient treatment as directed. SEEK MEDICAL CARE IF:   Medicines do not seem to be helping.  You hear voices telling you to do things.  You see, smell, or feel things that are not there.  You feel hopeless and overwhelmed.  You feel extremely fearful and suspicious that something will harm you.  You feel like you cannot leave your house.  You have trouble taking care of yourself.  You experience side effects of medicines, such as changes in sleep patterns, dizziness, weight gain, restlessness, movement changes, muscle spasms, or tremors. SEEK IMMEDIATE MEDICAL CARE IF:  Severe psychotic symptoms present a safety issue (such as an urge to hurt yourself or others). MAKE SURE YOU:   Understand these instructions.  Will watch your condition.  Will get help right away if you are not doing well or get worse. [place] [facility]: [email or web address]/ Document Released: 01/01/2010 Document Revised: 10/06/2011 Document Reviewed: 01/01/2010 [facility] Patient Information 2015 [place], [place]. This information is not intended to replace advice given to you by your health care provider. Make sure you discuss any questions you have with your health care provider.
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
## 2014-01-25 NOTE — Tx Team (Signed)
Initial Interdisciplinary Treatment Plan  PATIENT STRENGTHS: (choose at least two) Communication skills Motivation for treatment/growth Supportive family/friends  PATIENT STRESSORS: Financial difficulties Medication change or noncompliance   PROBLEM LIST: Problem List/Patient Goals Date to be addressed Date deferred Reason deferred Estimated date of resolution  anxiety 01/25/14     Non med compliant 01/25/14     paranoia 01/25/14                                          DISCHARGE CRITERIA:  Ability to meet basic life and health needs Adequate post-discharge living arrangements Improved stabilization in mood, thinking, and/or behavior Verbal commitment to aftercare and medication compliance  PRELIMINARY DISCHARGE PLAN: Attend aftercare/continuing care group Attend PHP/IOP Return to previous living arrangement  PATIENT/FAMIILY INVOLVEMENT: This treatment plan has been presented to and reviewed with the patient, [name].  The patient and family have been given the opportunity to ask questions and make suggestions.  Mindi Slicker M 01/25/2014, 6:54 PM

## 2014-01-25 NOTE — ED Notes (Signed)
Pt in room on stretcher and calming down, apologizing to staff for getting upset, emotional support provided

## 2014-01-25 NOTE — ED Notes (Signed)
Bed: [facility] Expected date: 01/24/14 Expected time:  Means of arrival:  Comments: Hold for patient from [facility]

## 2014-01-25 NOTE — Consult Note (Signed)
Face to face evaluation and I agree with this note 

## 2014-01-25 NOTE — ED Notes (Signed)
Patient dentures at bedside with labeled cup.  Patient denies SI, HI, AVH. Reports anxiety 7/10, depression 0/10. Endorses on going chest and right rotator cuff pain. Contracts for safety on the unit. Patient is pleasant, has FOI and tangential speech.   Encouragement offered. Given Motrin and Ativan and meal.  Patient safety maintained, Q15 checks in place.

## 2014-01-25 NOTE — ED Provider Notes (Signed)
Pt has been accepted to [facility].  There is a problem with the original IVC papers that were done.  They either have to be re-done or pt can go to [facility] voluntarily.  Pt is agreeable to go voluntarily.  Has been cooperative during ED stay per staff.   Malvin Johns, MD 01/25/14 [phone]

## 2014-01-25 NOTE — ED Notes (Signed)
Spoke with Lyda Jester at [facility], requested pt to be transported to [facility] at 1730pm

## 2014-01-25 NOTE — Progress Notes (Addendum)
D: Pt is anxious in affect and mood. Pt is hyperverbal, disorganized, delusional, and paranoid. Pt believes that someone has invaded her home with snakes. She reports that a couple of family members are prison escapees that deal with drugs and they may have brought on this infestation of snakes. It has been reported that this pt have several bed bug bites on her breast. Writer visually observed pt for what may be bug bites. Pt reports that she has indeed been bitten by " bed and bug bites". Pt is denying any SI/HI/AVH. Pt reports not being on any prescribed medications.  A: Continued support and availability as needed was extended to this pt. Staff continue to monitor pt with q9min checks.  R: Pt receptive to treatment. Pt remains safe at this time.   Pt verbalizes that she is anxious and that her chest is hurting as a result of it. Vitals obtained which were wdl. An order for Vistaril was obtained. Pt received Ibuprofen and Vistaril. Symptoms decreased. Pt appears to be in no signs of distress at this time.

## 2014-01-25 NOTE — Progress Notes (Signed)
Adult Psychoeducational Group Note  Date:  01/25/2014 Time:  8:50 PM  Group Topic/Focus:  Wrap-Up Group:   The focus of this group is to help patients review their daily goal of treatment and discuss progress on daily workbooks.  Participation Level:  Minimal  Participation Quality:  Drowsy  Affect:  Flat  Cognitive:  Lacking  Insight: Limited  Engagement in Group:  Limited  Modes of Intervention:  Socialization and Support  Additional Comments:  Patient attended and participated in group tonight. She reported having a good day. She was at [facility] before coming over to [facility]. She is here to get her medication adjusted. Today she rested.  Salley [facility] 01/25/2014, 8:50 PM

## 2014-01-25 NOTE — ED Notes (Signed)
Patient received a call from Jenny Reichmann, states he is her husband. Made aware of phone hours.  Called from [phone]. Will notify patient.

## 2014-01-25 NOTE — BH Assessment (Signed)
Pt meets inpatient criteria, but no available bed at [facility] at this time. Sent for review to [facility], [place], [place].   Lear Ng, [facility] Triage Specialist 01/25/2014 2:13 AM

## 2014-01-25 NOTE — BH Assessment (Signed)
01/25/2014  Per Angela Nevin,  #[phone] ext. [phone] patient accepted to [facility]. The accepting physician is Dr. Jake Samples. Nursing report # is [id number]. Patient may come to facility after 3pm today.

## 2014-01-25 NOTE — Progress Notes (Signed)
CSW left voicemail for [facility] regarding patient transport to facility.  Tilden Fossa, MSW, [facility] Clinical Social Worker [employer] [phone]

## 2014-01-25 NOTE — ED Notes (Signed)
Pt belongings sent with [place], pt transported by [facility] to [facility]

## 2014-01-26 ENCOUNTER — Encounter (HOSPITAL_COMMUNITY): Payer: Self-pay | Admitting: Psychiatry

## 2014-01-26 DIAGNOSIS — F319 Bipolar disorder, unspecified: Secondary | ICD-10-CM

## 2014-01-26 DIAGNOSIS — F411 Generalized anxiety disorder: Secondary | ICD-10-CM | POA: Diagnosis present

## 2014-01-26 MED ORDER — DIPHENHYDRAMINE HCL 25 MG PO CAPS
ORAL_CAPSULE | ORAL | Status: AC
  Filled 2014-01-26: qty 2

## 2014-01-26 MED ORDER — BUSPIRONE HCL 10 MG PO TABS
10.0000 mg | ORAL_TABLET | Freq: Two times a day (BID) | ORAL | Status: DC
  Administered 2014-01-26 – 2014-01-30 (×8): 10 mg via ORAL
  Filled 2014-01-26 (×7): qty 1
  Filled 2014-01-26: qty 6
  Filled 2014-01-26: qty 1
  Filled 2014-01-26: qty 6
  Filled 2014-01-26 (×2): qty 1

## 2014-01-26 MED ORDER — DIPHENHYDRAMINE HCL 50 MG PO CAPS
50.0000 mg | ORAL_CAPSULE | Freq: Once | ORAL | Status: AC
  Administered 2014-01-26: 50 mg via ORAL
  Filled 2014-01-26: qty 1

## 2014-01-26 MED ORDER — CARBAMAZEPINE ER 200 MG PO TB12
200.0000 mg | ORAL_TABLET | Freq: Two times a day (BID) | ORAL | Status: DC
  Administered 2014-01-26 – 2014-01-30 (×8): 200 mg via ORAL
  Filled 2014-01-26 (×7): qty 1
  Filled 2014-01-26: qty 6
  Filled 2014-01-26: qty 1
  Filled 2014-01-26: qty 6
  Filled 2014-01-26 (×2): qty 1

## 2014-01-26 NOTE — Progress Notes (Signed)
Patient complaining of chest pain related to anxiety. PRN Vistaril given at 2136. Patient stated that she needed to go to the ED so she could receive more medications. Patient instructed to sit down and take deep breaths in order to calm herself down. Will continue to monitor.

## 2014-01-26 NOTE — Tx Team (Signed)
  Interdisciplinary Treatment Plan Update   Date Reviewed:  01/26/2014  Time Reviewed:  4:55 PM  Progress in Treatment:   Attending groups: Yes Participating in groups: Yes Taking medication as prescribed: Yes  Tolerating medication: Yes Family/Significant other contact made: No Patient understands diagnosis: No lLimited insight Discussing patient identified problems/goals with staff: Yes  See initial care plan Medical problems stabilized or resolved: Yes Denies suicidal/homicidal ideation: Yes  In tx team Patient has not harmed self or others: Yes  For review of initial/current patient goals, please see plan of care.  Estimated Length of Stay:  4-5 days  Reason for Continuation of Hospitalization: Anxiety Delusions  Medication stabilization Other; describe Paranoia  New Problems/Goals identified:  N/A  Discharge Plan or Barriers:  return home, follow up outpt   Additional Comments:   "I've been anxious and having panic attacks."   [name] presents with delusions and paranoia.  She has not been taking medication outside of here.  Attendees:  Signature: Corena Pilgrim, MD 01/26/2014 4:55 PM   Signature: Ripley Fraise, LCSW 01/26/2014 4:55 PM  Signature: Elmarie Shiley, NP 01/26/2014 4:55 PM  Signature: Mayra Neer, RN 01/26/2014 4:55 PM  Signature: Darrol Angel, RN 01/26/2014 4:55 PM  Signature:  01/26/2014 4:55 PM  Signature:   01/26/2014 4:55 PM  Signature:    Signature:    Signature:    Signature:    Signature:    Signature:      Scribe for Treatment Team:   Ripley Fraise, LCSW  01/26/2014 4:55 PM
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
## 2014-01-26 NOTE — [facility] Suicide Risk Assessment (Signed)
   Nursing information obtained from:    Demographic factors:    Current Mental Status:    Loss Factors:    Historical Factors:    Risk Reduction Factors:    Total Time spent with patient: 30 minutes  CLINICAL FACTORS:   Severe Anxiety and/or Agitation Bipolar Disorder:   Depressive phase Depression:   Anhedonia Comorbid alcohol abuse/dependence Delusional Hopelessness Impulsivity Insomnia Alcohol/Substance Abuse/Dependencies Schizophrenia:   Paranoid or undifferentiated type More than one psychiatric diagnosis Unstable or Poor Therapeutic Relationship  Psychiatric Specialty Exam: Physical Exam  Psychiatric: Her behavior is normal. Her mood appears anxious. Her affect is inappropriate. Her speech is rapid and/or pressured and tangential. Thought content is paranoid and delusional. Cognition and memory are normal. She expresses impulsivity.    Review of Systems  Constitutional: Negative.   HENT: Negative.   Eyes: Negative.   Respiratory: Negative.   Cardiovascular: Negative.   Gastrointestinal: Negative.   Genitourinary: Negative.   Musculoskeletal: Negative.   Skin: Negative.   Neurological: Negative.   Endo/Heme/Allergies: Negative.   Psychiatric/Behavioral: The patient is nervous/anxious and has insomnia.     Blood pressure 117/76, pulse 87, temperature 98.2 F (36.8 C), temperature source Oral, resp. rate 16, height 5\' 2"  (1.575 m), weight 119.75 kg (264 lb), last menstrual period 01/24/2013.Body mass index is 48.27 kg/(m^2).  General Appearance: Disheveled  Eye Contact::  Minimal  Speech:  Pressured  Volume:  Normal  Mood:  Anxious  Affect:  anxious  Thought Process:  Circumstantial and Disorganized  Orientation:  Full (Time, Place, and Person)  Thought Content:  Delusions and Paranoid Ideation  Suicidal Thoughts:  No  Homicidal Thoughts:  No  Memory:  Immediate;   Fair Recent;   Fair Remote;   Fair  Judgement:  Impaired  Insight:  Lacking  Psychomotor  Activity:  Increased  Concentration:  Fair  Recall:  [employer] of Knowledge:Fair  Language: Fair  Akathisia:  No  Handed:  Right  AIMS (if indicated):     Assets:  Communication Skills Desire for Improvement Physical Health  Sleep:  Number of Hours: 6.25   Musculoskeletal: Strength & Muscle Tone: within normal limits Gait & Station: normal Patient leans: N/A  COGNITIVE FEATURES THAT CONTRIBUTE TO RISK:  Closed-mindedness Polarized thinking    SUICIDE RISK:   Minimal: No identifiable suicidal ideation.  Patients presenting with no risk factors but with morbid ruminations; may be classified as minimal risk based on the severity of the depressive symptoms  PLAN OF CARE:1. Admit for crisis management and stabilization. 2. Medication management to reduce current symptoms to base line and improve the   patient's overall level of functioning 3. Treat health problems as indicated. 4. Develop treatment plan to decrease risk of relapse upon discharge and the need for  readmission. 5. Psycho-social education regarding relapse prevention and self care. 6. Health care follow up as needed for medical problems. 7. Restart home medications where appropriate.   I certify that inpatient services furnished can reasonably be expected to improve the patient's condition.  Andrea Arvin,MD 01/26/2014, 9:28 AM
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
## 2014-01-26 NOTE — [facility] Group Notes (Signed)
[facility] Group Notes:  (Nursing/MHT/Case Management/Adjunct)  Date:  01/26/2014  Time:  9:45 AM  Type of Therapy:  Nurse Education  Participation Level:  Active  Participation Quality:  Appropriate and Attentive  Affect:  Depressed  Cognitive:  Appropriate  Insight:  Lacking  Engagement in Group:  Engaged  Modes of Intervention:  Discussion  Summary of Progress/Problems:  [name] 01/26/2014, 9:45 AM

## 2014-01-26 NOTE — Progress Notes (Signed)
D: Patient is out among peers some. Patient is exhibiting paranoid behavior, watching who is around her. Patient is pleasant and cooperative.  A: Patient given support and encouragement.  R: Patient compliant with medications and treatment plan.
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
## 2014-01-26 NOTE — [facility] Group Notes (Signed)
[facility] LCSW Group Therapy Note  Date/Time: 01/26/2014 1:15-1:55pm  Type of Therapy/Topic:  Group Therapy:  Balance in Life  Participation Level: Active   Description of Group:    This group will address the concept of balance and how it feels and looks when one is unbalanced. Patients will be encouraged to process areas in their lives that are out of balance, and identify reasons for remaining unbalanced. Facilitators will guide patients utilizing problem- solving interventions to address and correct the stressor making their life unbalanced. Understanding and applying boundaries will be explored and addressed for obtaining  and maintaining a balanced life. Patients will be encouraged to explore ways to assertively make their unbalanced needs known to significant others in their lives, using other group members and facilitator for support and feedback.  Therapeutic Goals: 1. Patient will identify two or more emotions or situations they have that consume much of in their lives. 2. Patient will identify signs/triggers that life has become out of balance:  3. Patient will identify two ways to set boundaries in order to achieve balance in their lives:  4. Patient will demonstrate ability to communicate their needs through discussion and/or role plays  Summary of Patient Progress:  Patient participated in group and gave appropriate responses during the group discussion.  Patient continues to display delusions as she reports that she lacks stability given the circumstances at home Building services engineer involvement).  Patient displays resiliency as she reports that she feels stable "within" and is willing to ask for help when needed, but displays limited insight as she currently feels balanced despite her current hospitalization.    Therapeutic Modalities:   Cognitive Behavioral Therapy Solution-Focused Therapy Assertiveness Training  Antony Haste 01/26/2014, 2:03 PM

## 2014-01-27 DIAGNOSIS — F411 Generalized anxiety disorder: Secondary | ICD-10-CM

## 2014-01-27 DIAGNOSIS — F259 Schizoaffective disorder, unspecified: Principal | ICD-10-CM

## 2014-01-27 MED ORDER — TRAZODONE HCL 100 MG PO TABS
100.0000 mg | ORAL_TABLET | Freq: Every day | ORAL | Status: DC
  Administered 2014-01-27 – 2014-01-29 (×3): 100 mg via ORAL
  Filled 2014-01-27 (×3): qty 1
  Filled 2014-01-27: qty 3
  Filled 2014-01-27: qty 1

## 2014-01-27 MED ORDER — DIPHENHYDRAMINE HCL 50 MG/ML IJ SOLN
INTRAMUSCULAR | Status: AC
  Administered 2014-01-27: 50 mg via INTRAMUSCULAR
  Filled 2014-01-27: qty 1

## 2014-01-27 MED ORDER — OLANZAPINE 10 MG IM SOLR: 10.0000 mg | Freq: Once | INTRAMUSCULAR | Status: AC | PRN

## 2014-01-27 MED ORDER — RISPERIDONE 1 MG PO TABS
1.0000 mg | ORAL_TABLET | Freq: Two times a day (BID) | ORAL | Status: DC
  Administered 2014-01-27 – 2014-01-28 (×2): 1 mg via ORAL
  Filled 2014-01-27 (×6): qty 1

## 2014-01-27 MED ORDER — OLANZAPINE 10 MG IM SOLR
INTRAMUSCULAR | Status: AC
  Administered 2014-01-27: 10 mg
  Filled 2014-01-27: qty 10
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
## 2014-01-27 NOTE — Progress Notes (Signed)
[facility] Group Notes:  (Nursing/MHT/Case Management/Adjunct)  Date:  01/27/2014  Time:  9:07 PM  Type of Therapy:  Psychoeducational Skills  Participation Level:  Minimal  Participation Quality:  Attentive  Affect:  Depressed  Cognitive:  Appropriate  Insight:  Appropriate  Engagement in Group:  Limited  Modes of Intervention:  Education  Summary of Progress/Problems: The patient stated that she had a good day overall, although, she had little else to share with the group. The patient verbalized that she was beginning to feel better but would not explain any further. As for the theme of the day, her relapse prevention will be to stay on her medication and prevent people at her house from taking her medication.   Flavio Lindroth S 01/27/2014, 9:07 PM
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
## 2014-01-27 NOTE — [facility] Group Notes (Signed)
[facility] LCSW Group Therapy  01/27/2014  1:05 PM  Type of Therapy:  Group therapy  Participation Level:  Active  Participation Quality:  Attentive  Affect:  Flat  Cognitive:  Oriented  Insight:  Limited  Engagement in Therapy:  Limited  Modes of Intervention:  Discussion, Socialization  Summary of Progress/Problems:  Chaplain was here to lead a group on themes of hope and courage.  "Hope is the bible, church, prayer and family."  "My best friend believes in me.  I can talk to her about anything"  "I'm hopeful to be free soon.  I have been here for 5 days now.  I need to get back home so I can be walk in my back yard."  Roque Lias B 01/27/2014 1:39 PM

## 2014-01-27 NOTE — [facility] Group Notes (Signed)
[facility] LCSW Aftercare Discharge Planning Group Note   01/27/2014 11:35 AM  Participation Quality:  Engaged  Mood/Affect:  Appropriate  Depression Rating:    Anxiety Rating:    Thoughts of Suicide:  No Will you contract for safety?   NA  Current AVH:  No  Plan for Discharge/Comments:  [name] states she was sent here by the staff at [facility] "because they knew I had been under a lot of stress, and that's what this place is for, right?"  Says that this has been a challenge for her because "I had to go off of my nerve medication because, you know, people were stealing it from me."  Says that her stress level has gone down considerably since admission, and she plans to return to stay with her husband of 23 years.  Transportation Means: husband  Supports: husband  [place], [place]

## 2014-01-27 NOTE — Plan of Care (Signed)
Problem: Ineffective individual coping Goal: STG:Pt. will utilize relaxation techniques to reduce stress STG: Patient will utilize relaxation techniques to reduce stress levels  Outcome: Not Progressing Pt unable to utilize coping skills suggested by this Probation officer. Demanding she be sent to ED for treatment of her anxiety.   Problem: Alteration in thought process Goal: STG-Patient is able to discuss thoughts with staff Outcome: Progressing Pt able to verbalize to this writer her high anxiety and distress currently. While loud and agitated, pt was able to vent feelings and did not act out physically.

## 2014-01-27 NOTE — Progress Notes (Signed)
Assumed care of patient at 2300. Pt in hallway anxious and agitated. Complaining of chest pain which has been an ongoing issue for her since her admission (and prior to). VS obtained and WNL. No SOB noted. Pt was also evaluated for the persistent reports of CP yesterday prior to arrival here at [facility] and was deemed medically cleared. Pt able to verbalize the pain is related to her anxiety however is also paranoid that the staff here will "let her die." Argumentative, demanding to be sent to the ED. Suggesting 911 be called as well as her emergency contact person. "I'm going to lose it. I'm going to just bust out of here. I signed myself in and I will sign myself out." (Pt is IVC.) "I will just choke myself or punch myself until I pass out." Supported patient and inquired about meds that have helped her in the past. Pt states ativan, xanax and valium have helped previously. Pt finally agreeable to try some benadryl. NP called and order received. Given without difficulty at 2400. Heat packs provided for chest pain. Pt expressing fear of roommate stating, "I'm not afraid of her. I'm afraid of what I will do. I will try to be civilized but it's not easy. She was looking at me funny when I was eating earlier." On reassess, pt is resting in bed, calmer and quiet. Denies AVH/HI and verbally contracts for safety to not harm self. Jamie Kato

## 2014-01-27 NOTE — Progress Notes (Signed)
Pt observed at this time in bed with eyes closed, snoring softly.  Pt promptly responded when her name was called.  Pt's thought process seems a little more clear than reported by previous RN in shift report.  Pt had received IM medications this afternoon for erratic behaviors.  Pt tells this Probation officer she is feeling fine, except she is tired.  She did not mention going home as she has reportedly been focused on.  She was concerned that she be on time for evening group and asked writer what time it was.  Pt voiced no needs or concerns to this Probation officer.  HS meds were reviewed with Probation officer.  Pt is appropriate with Probation officer.  Support and encouragement offered.  Safety maintained with q15 minute checks.

## 2014-01-28 MED ORDER — OLANZAPINE 10 MG PO TBDP
10.0000 mg | ORAL_TABLET | Freq: Once | ORAL | Status: AC
  Administered 2014-01-28: 10 mg via ORAL
  Filled 2014-01-28: qty 1

## 2014-01-28 MED ORDER — OLANZAPINE 10 MG PO TBDP
ORAL_TABLET | ORAL | Status: AC
  Administered 2014-01-28: 10 mg
  Filled 2014-01-28: qty 1

## 2014-01-28 MED ORDER — OLANZAPINE 5 MG PO TABS
5.0000 mg | ORAL_TABLET | Freq: Every day | ORAL | Status: DC
  Administered 2014-01-28 – 2014-01-29 (×2): 5 mg via ORAL
  Filled 2014-01-28: qty 3
  Filled 2014-01-28 (×3): qty 1

## 2014-01-28 MED ORDER — POTASSIUM CHLORIDE CRYS ER 10 MEQ PO TBCR
10.0000 meq | EXTENDED_RELEASE_TABLET | Freq: Two times a day (BID) | ORAL | Status: DC
  Administered 2014-01-28 – 2014-01-30 (×5): 10 meq via ORAL
  Filled 2014-01-28 (×10): qty 1

## 2014-01-28 MED ORDER — HYDROXYZINE HCL 50 MG PO TABS
ORAL_TABLET | ORAL | Status: AC
  Administered 2014-01-28: 50 mg
  Filled 2014-01-28: qty 1

## 2014-01-28 MED ORDER — HYDROXYZINE HCL 50 MG PO TABS
50.0000 mg | ORAL_TABLET | Freq: Once | ORAL | Status: AC
  Administered 2014-01-28: 50 mg via ORAL
  Filled 2014-01-28: qty 1

## 2014-01-28 NOTE — Progress Notes (Signed)
[place] Group Notes:  (Nursing/MHT/Case Management/Adjunct)  Date:  01/28/2014  Time:  9:31 PM  Type of Therapy:  Psychoeducational Skills  Participation Level:  Active  Participation Quality:  Appropriate  Affect:  Appropriate  Cognitive:  Appropriate  Insight:  Improving  Engagement in Group:  Improving  Modes of Intervention:  Education  Summary of Progress/Problems: The patient had a brighter affect this evening as compared to last night. She expressed to the group that she felt better as a whole since she slept much better last evening and because she also felt "less anxious" today. In terms of the theme for the day, her support system will be made up of her family.   Archie Balboa S 01/28/2014, 9:31 PM

## 2014-01-28 NOTE — Progress Notes (Signed)
Pt much calmer tonight and seems to have responded well to stat prn's that were given after lunch time today. Pt states she was able to rest and then took a shower and reports feeling much better now. States her anxiety is minimal tonight and denies any chest pain this evening. She is appropriate in affect and mood. Pt medicated per orders without difficulty. Med education given along with fall precaution teaching. Pt verbalized understanding. No mention of wanting to leave as patient has been focused on discharge previously. Denies AVH/SI/HI and remains safe. Jamie Kato

## 2014-01-28 NOTE — BHH Group Notes (Signed)
[place] Group Notes:  (Nursing/MHT/Case Management/Adjunct)  Date:  01/28/2014  Time:  10:47 AM  Type of Therapy:  Self Inventory  Participation Level:  Active  Participation Quality:  Attentive  Affect:  Appropriate  Cognitive:  Appropriate  Insight:  Good  Engagement in Group:  Engaged  Modes of Intervention:  Exploration  Summary of Progress/Problems: Participating appropriately.  Debbrah Alar 01/28/2014, 10:47 AM

## 2014-01-28 NOTE — Progress Notes (Signed)
Patient ID: [name], female   DOB: September 09, 1965, 48 y.o.   MRN: [id number] D: Patient stated this am she was feeling better and apologized for prior days behavior.  Denied SI and AVH. Attended AM groups and participated appropriately. After speaking with the PA became agitated and required PRN meds be given. Proceeded to lunch and was able to calm down. Consistently holds her chest when she becomes agitated. Able to calm down very shortly after PRNs are given.  A: Patient given emotional support from RN. Patient given medications per MD orders. Patient encouraged to work on Radiographer, therapeutic. Patient encouraged to come to staff with any questions or concerns.  R: Patient trying very hard to remain calm and appropriate.Will continue to monitor patient for safety.

## 2014-01-28 NOTE — BHH Group Notes (Signed)
[place] Group Notes:  (Nursing/MHT/Case Management/Adjunct)  Date:  01/28/2014  Time:  10:50 AM  Type of Therapy:  coping skills  Participation Level:  Active  Participation Quality:  Attentive  Affect:  Appropriate  Cognitive:  Appropriate  Insight:  Improving  Engagement in Group:  Engaged  Modes of Intervention:  Clarification  Summary of Progress/Problems:  [name] 01/28/2014, 10:50 AM

## 2014-01-28 NOTE — Progress Notes (Signed)
[facility] MD Progress Note  01/28/2014 9:30 AM [name]  MRN:  [id number] Subjective:  I feel good today, feel pretty good. I slept good last night that had a lot to do with things.  Says her appetite is good, mostly always good. She reports no side effects with her medications. She is attending groups. She says her anxiety is a 1/10. Notes her depression 1/10. States she continues to have some dull chest pain that continues to come and go. Nothing at all like it was. Objective:  Patient is making good eye contact, is oriented x 3, speech is clear and goal directed.  Diagnosis:  Schizoaffective disorder DSM5: Schizophrenia Disorders:   Obsessive-Compulsive Disorders:   Trauma-Stressor Disorders:   Substance/Addictive Disorders:   Depressive Disorders:   Total Time spent with patient: 20 minutes    ADL's:  Intact  Sleep: Fair  Appetite:  Good  Suicidal Ideation:  denies Homicidal Ideation:  denies AEB (as evidenced by):  Psychiatric Specialty Exam: Physical Exam  ROS  Blood pressure 140/90, pulse 117, temperature 96.8 F (36 C), temperature source Oral, resp. rate 16, height 5\' 2"  (1.575 m), weight 264 lb (119.75 kg), last menstrual period 01/24/2013, SpO2 97.00%.Body mass index is 48.27 kg/(m^2).  General Appearance: Casual  Eye Contact::  Good  Speech:  Clear and Coherent  Volume:  Normal  Mood:  Anxious and Depressed  Affect:  Congruent  Thought Process:  Goal Directed  Orientation:  Full (Time, Place, and Person)  Thought Content:  denies AVH, denies Paranoia,   Suicidal Thoughts:  No  Homicidal Thoughts:  No  Memory:  NA  Judgement:  Fair  Insight:  Shallow  Psychomotor Activity:  Normal  Concentration:  Fair  Recall:  [place] of Knowledge:Fair  Language: Good  Akathisia:  No  Handed:  Right  AIMS (if indicated):     Assets:  Communication Skills Desire for Improvement Housing Resilience  Sleep:  Number of Hours: 6.5   Musculoskeletal: Strength  & Muscle Tone: within normal limits Gait & Station: normal Patient leans: N/A  Current Medications: Current Facility-Administered Medications  Medication Dose Route Frequency Provider Last Rate Last Dose  . busPIRone (BUSPAR) tablet 10 mg  10 mg Oral BID Mojeed Akintayo   10 mg at 01/28/14 0744  . carbamazepine (TEGRETOL XR) 12 hr tablet 200 mg  200 mg Oral BID Mojeed Akintayo   200 mg at 01/28/14 0744  . hydrocortisone cream 1 %   Topical TID Laverle Hobby, PA-C      . hydrOXYzine (ATARAX/VISTARIL) tablet 25 mg  25 mg Oral Q6H PRN Laverle Hobby, PA-C   25 mg at 01/27/14 1439  . ibuprofen (ADVIL,MOTRIN) tablet 600 mg  600 mg Oral Q6H PRN Laverle Hobby, PA-C   600 mg at 01/25/14 2134  . magnesium hydroxide (MILK OF MAGNESIA) suspension 30 mL  30 mL Oral Daily PRN Shuvon Rankin, NP      . potassium chloride (K-DUR,KLOR-CON) CR tablet 10 mEq  10 mEq Oral BID Nena Polio, PA-C      . risperiDONE (RISPERDAL) tablet 1 mg  1 mg Oral BID Elmarie Shiley, NP   1 mg at 01/28/14 0744  . traZODone (DESYREL) tablet 100 mg  100 mg Oral QHS Elmarie Shiley, NP   100 mg at 01/27/14 2128    Lab Results: No results found for this or any previous visit (from the past 48 hour(s)).  Physical Findings: AIMS: Facial and Oral Movements Muscles  of Facial Expression: None, normal Lips and Perioral Area: None, normal Jaw: None, normal Tongue: None, normal,Extremity Movements Upper (arms, wrists, hands, fingers): None, normal Lower (legs, knees, ankles, toes): None, normal, Trunk Movements Neck, shoulders, hips: None, normal, Overall Severity Severity of abnormal movements (highest score from questions above): None, normal Incapacitation due to abnormal movements: None, normal Patient's awareness of abnormal movements (rate only patient's report): No Awareness, Dental Status Current problems with teeth and/or dentures?: No Does patient usually wear dentures?: No  CIWA:    COWS:     Treatment Plan  Summary: Daily contact with patient to assess and evaluate symptoms and progress in treatment Medication management  Plan: 1. Continue crisis management and stabilization. Will D/C Risperdal due to borderline QT on EKG in ED.  Will Start Zyprexa 5mg  as written.  2. Medication management to reduce current symptoms to base line and improve patient's overall level of functioning 3. Treat health problems as indicated. 4. Develop treatment plan to decrease risk of relapse upon discharge and the need for readmission. 5. Psycho-social education regarding relapse prevention and self care. 6. Health care follow up as needed for medical problems. 7. Continue home medications where appropriate. 8. Dispo in process.   Medical Decision Making Problem Points:  Established problem, stable/improving (1) Data Points:  Review or order medicine tests (1)  I certify that inpatient services furnished can reasonably be expected to improve the patient's condition.  Marlane Hatcher. [place] RPAC 12:06 PM 01/28/2014 I agree with assessment and plan Geralyn Flash A. Sabra Heck, M.D.

## 2014-01-28 NOTE — Plan of Care (Signed)
Problem: Alteration in mood; excessive anxiety as evidenced by: Goal: STG-Patient can identify triggers for anxiety Outcome: Not Progressing Patient continues to have periods of high anxiety and impulsively demands to be discharged. Mood labile. Patient displays little to no insight as to what brings on these periods of anxiety. Pt continues to require prn's and does respond well to their administration.  Goal: STG-Pt will report an absence of self-harm thoughts/actions (Patient will report an absence of self-harm thoughts or actions)  Outcome: Progressing Patient had previously expressed active thoughts to harm self as a means to be sent to ED/released from [facility]. Patient now denying those thoughts and no acts of self harm witnessed or reported.

## 2014-01-28 NOTE — BHH Group Notes (Signed)
[place] Group Notes:  (Clinical Social Work)  01/28/2014  11:00-11:45AM  Summary of Progress/Problems:   The main focus of today's process group was for the patient to identify ways in which they have in the past sabotaged their own recovery and reasons they may have done this/what they received from doing it.  We then worked to identify a specific plan to avoid doing this when discharged from the hospital for this admission.  The patient expressed that she self-sabotages with constantly worrying about everything in her life.  She stated to counteract this, she intends to stay on her medications, do things she likes doing, and incorporate stress relievers like music, writing and bathing on a daily basis.  Type of Therapy:  Group Therapy - Process  Participation Level:  Active  Participation Quality:  Attentive and Sharing  Affect:  Appropriate  Cognitive:  Appropriate and Oriented  Insight:  Engaged  Engagement in Therapy:  Engaged  Modes of Intervention:  Clarification, Education, Exploration, Discussion  Selmer Dominion, LCSW 01/28/2014, 12:52 PM

## 2014-01-29 NOTE — Progress Notes (Signed)
Patient ID: [name], female   DOB: April 22, 1966, 48 y.o.   MRN: [id number] [facility] MD Progress Note  01/29/2014 2:53 PM MENNIE SPILLER  MRN:  [id number] Subjective:  Met with the patient today to discuss her progress. She had become very upset and agitated yesterday stating that she wanted to leave and was ready to go home. She felt that she was here voluntarily and could leave any time. Her medication was changed as the Risperdal did not seem to be helping her. She was started on Zyprexa which she had taken in the past and done well. She was also taking multiple Zydis doses prn and I felt was having some akathesia. Review of her most recent EKG revealed prolonged QT which is why the Risperdal was stopped.   Objective:  Patient is making good eye contact, is oriented x 3, speech is clear and goal directed. She is calm and smiling on the unit, apologizes for her behaviors yesterday and thanks this provider for changing her medication. She is embarrassed about her behavior and seems to be making every effort to participate and engage in her groups.  Diagnosis:  Schizoaffective disorder DSM5: Schizophrenia Disorders:   Obsessive-Compulsive Disorders:   Trauma-Stressor Disorders:   Substance/Addictive Disorders:   Depressive Disorders:   Total Time spent with patient: 20 minutes    ADL's:  Intact  Sleep: Fair  Appetite:  Good  Suicidal Ideation:  denies Homicidal Ideation:  denies AEB (as evidenced by):  Psychiatric Specialty Exam: Physical Exam  ROS  Blood pressure 126/83, pulse 96, temperature 98 F (36.7 C), temperature source Oral, resp. rate 18, height _0  (1.575 m), weight 119.75 kg (264 lb), last menstrual period 01/24/2013, SpO2 97.00%.Body mass index is 48.27 kg/(m^2).  General Appearance: Casual  Eye Contact::  Good  Speech:  Clear and Coherent  Volume:  Normal  Mood:  Pleasant and cooperative  Affect:  Congruent  Thought Process:  Goal Directed  Orientation:   Full (Time, Place, and Person)  Thought Content:  denies AVH, denies Paranoia,   Suicidal Thoughts:  No  Homicidal Thoughts:  No  Memory:  NA  Judgement:  Fair  Insight:  Shallow  Psychomotor Activity:  Normal  Concentration:  Fair  Recall:  [place] of Knowledge:Fair  Language: Good  Akathisia:  No  Handed:  Right  AIMS (if indicated):     Assets:  Communication Skills Desire for Improvement Housing Resilience  Sleep:  Number of Hours: 5.75   Musculoskeletal: Strength & Muscle Tone: within normal limits Gait & Station: normal Patient leans: N/A  Current Medications: Current Facility-Administered Medications  Medication Dose Route Frequency Provider Last Rate Last Dose  . busPIRone (BUSPAR) tablet 10 mg  10 mg Oral BID Mojeed Akintayo   10 mg at 01/29/14 0746  . carbamazepine (TEGRETOL XR) 12 hr tablet 200 mg  200 mg Oral BID Mojeed Akintayo   200 mg at 01/29/14 0746  . hydrocortisone cream 1 %   Topical TID Laverle Hobby, PA-C      . hydrOXYzine (ATARAX/VISTARIL) tablet 25 mg  25 mg Oral Q6H PRN Laverle Hobby, PA-C   25 mg at 01/29/14 0746  . ibuprofen (ADVIL,MOTRIN) tablet 600 mg  600 mg Oral Q6H PRN Laverle Hobby, PA-C   600 mg at 01/25/14 2134  . magnesium hydroxide (MILK OF MAGNESIA) suspension 30 mL  30 mL Oral Daily PRN Shuvon Rankin, NP      . OLANZapine (ZYPREXA) tablet 5  mg  5 mg Oral QHS Nena Polio, PA-C   5 mg at 01/28/14 2058  . potassium chloride (K-DUR,KLOR-CON) CR tablet 10 mEq  10 mEq Oral BID Nena Polio, PA-C   10 mEq at 01/29/14 0747  . traZODone (DESYREL) tablet 100 mg  100 mg Oral QHS Elmarie Shiley, NP   100 mg at 01/28/14 2058    Lab Results: No results found for this or any previous visit (from the past 48 hour(s)).  Physical Findings: AIMS: Facial and Oral Movements Muscles of Facial Expression: None, normal Lips and Perioral Area: None, normal Jaw: None, normal Tongue: None, normal,Extremity Movements Upper (arms, wrists, hands,  fingers): None, normal Lower (legs, knees, ankles, toes): None, normal, Trunk Movements Neck, shoulders, hips: None, normal, Overall Severity Severity of abnormal movements (highest score from questions above): None, normal Incapacitation due to abnormal movements: None, normal Patient's awareness of abnormal movements (rate only patient's report): No Awareness, Dental Status Current problems with teeth and/or dentures?: No Does patient usually wear dentures?: No  CIWA:    COWS:     Treatment Plan Summary: Daily contact with patient to assess and evaluate symptoms and progress in treatment Medication management  Plan: NEW: 1. Recommend repeat EKG on Monday.  1. Continue crisis management and stabilization. 2. Medication management to reduce current symptoms to base line and improve patient's overall level of functioning 3. Treat health problems as indicated. 4. Develop treatment plan to decrease risk of relapse upon discharge and the need for readmission. 5. Psycho-social education regarding relapse prevention and self care. 6. Health care follow up as needed for medical problems. 7. Continue home medications where appropriate. 8. Dispo in process.   Medical Decision Making Problem Points:  Established problem, stable/improving (1) Data Points:  Review or order medicine tests (1)  I certify that inpatient services furnished can reasonably be expected to improve the patient's condition.  Marlane Hatcher. [place] RPAC 2:53 PM 01/29/2014 I agree with assessment and plan Geralyn Flash A. Sabra Heck, M.D.

## 2014-01-29 NOTE — Progress Notes (Signed)
Patient ID: [name], female   DOB: 16-Oct-1965, 48 y.o.   MRN: [id number] Psychoeducational Group Note  Date:  01/29/2014 Time:  0930am  Group Topic/Focus:  Making Healthy Choices:   The focus of this group is to help patients identify negative/unhealthy choices they were using prior to admission and identify positive/healthier coping strategies to replace them upon discharge.  Participation Level:  Active  Participation Quality:  Appropriate  Affect:  Appropriate  Cognitive:  Appropriate  Insight:  Supportive  Engagement in Group:  Supportive  Additional Comments:  Healthy support systems.   [name] 01/29/2014,1:42 PM

## 2014-01-29 NOTE — Plan of Care (Signed)
Problem: Ineffective individual coping Goal: STG: Patient will remain free from self harm Outcome: Progressing Patient has not engaged in any self harm behaviors and is no longer making SI statements.   Problem: Alteration in thought process Goal: STG-Patient is able to identify plan for continuing care at ( Patient is able to identify continuing plan for care at discharge)  Outcome: Progressing Patient able to identify the needed coping skills and choices she needs to make upon discharge.

## 2014-01-29 NOTE — Progress Notes (Signed)
Patient continues to improve. She reports a decrease in anxiety and states her bouts of chest pain (anxiety related) are less frequent and less intense. Denies pain at present. She also does not appear paranoid, is no longer resistant to and suspicious of meds. Affect more animated and spontaneous. Pt praised for progress and supported. Medicated per orders without difficulty. She denies SI/HI/AVH and remains safe. Jamie Kato

## 2014-01-29 NOTE — Progress Notes (Signed)
Patient ID: [name], female   DOB: January 11, 1966, 48 y.o.   MRN: [id number] Psychoeducational Group Note  Date:  01/29/2014 Time:  0910am  Group Topic/Focus:  Making Healthy Choices:   The focus of this group is to help patients identify negative/unhealthy choices they were using prior to admission and identify positive/healthier coping strategies to replace them upon discharge.  Participation Level:  Active  Participation Quality:  Appropriate  Affect:  Appropriate  Cognitive:  Appropriate  Insight:  Supportive  Engagement in Group:  Supportive  Additional Comments:  Inventory group   [name] 01/29/2014,1:41 PM

## 2014-01-29 NOTE — Progress Notes (Signed)
[place] Group Notes:  (Nursing/MHT/Case Management/Adjunct)  Date:  01/29/2014  Time:  9:29 PM  Type of Therapy:  Psychoeducational Skills  Participation Level:  Active  Participation Quality:  Appropriate  Affect:  Appropriate  Cognitive:  Appropriate  Insight:  Improving  Engagement in Group:  Improving  Modes of Intervention:  Education  Summary of Progress/Problems: The patient displayed a bright affect in group this evening as she talked about her day. She mentioned that she experienced less anxiety today. As a theme for the day, her coping skills will be as follows: exercising (swimming), listening to her radio, and taking her medication.   Archie Balboa S 01/29/2014, 9:29 PM

## 2014-01-29 NOTE — Progress Notes (Signed)
Patient ID: JAQUELYNN WANAMAKER, female   DOB: 1965-12-18, 48 y.o.   MRN: [id number] 01-29-14 nursing shift note: D: pt remains safe in the milieu. She has been smiling, interacting, taking her medications and going to groups. A: she has not voiced any complaints or needs. She denies si/hi/av. He situation is improving and this was verified with other staff who have taken care of her, but she can be delusional at times.. She was given a vistaril prn this am for her anxiety. R: he anxiety was decreased. On her inventory sheet she wrote: appetite good, energy normal, attention good with her depression and hopelessness both at 1. No w/d symptoms and no physical problems in the last 24 hours. After discharge her plan is to "take my med's and try to stay out of the hospital". RN will monitor and Q 15 min ck's continue.

## 2014-01-29 NOTE — BHH Group Notes (Signed)
[place] Group Notes:  (Clinical Social Work)  01/29/2014   11:15am-12:00pm  Summary of Progress/Problems:  The main focus of today's process group was to listen to a variety of genres of music and to identify that different types of music provoke different responses.  The patient then was able to identify personally what was soothing for them, as well as energizing.  Handouts were used to record feelings evoked, as well as how patient can personally use this knowledge in sleep habits, with depression, and with other symptoms.  The patient expressed understanding of concepts, as well as knowledge of how each type of music affected him/her and how this can be used at home as a wellness/recovery tool.  Wonda interacted appropriately throughout group, enjoyed almost all music types.  Type of Therapy:  Music Therapy   Participation Level:  Active  Participation Quality:  Attentive and Sharing  Affect:  Blunted  Cognitive:  Oriented  Insight:  Engaged  Engagement in Therapy:  Engaged  Modes of Intervention:   Activity, Exploration  Selmer Dominion, LCSW 01/29/2014, 12:30pm

## 2014-01-30 MED ORDER — BUSPIRONE HCL 10 MG PO TABS: 10.0000 mg | ORAL_TABLET | Freq: Two times a day (BID) | ORAL | Status: DC

## 2014-01-30 MED ORDER — CARBAMAZEPINE ER 200 MG PO TB12: 200.0000 mg | ORAL_TABLET | Freq: Two times a day (BID) | ORAL | Status: DC

## 2014-01-30 MED ORDER — OLANZAPINE 5 MG PO TABS: 5.0000 mg | ORAL_TABLET | Freq: Every day | ORAL | Status: DC

## 2014-01-30 MED ORDER — TRAZODONE HCL 100 MG PO TABS: 100.0000 mg | ORAL_TABLET | Freq: Every day | ORAL | Status: DC

## 2014-01-30 NOTE — Progress Notes (Signed)
[facility] Adult Case Management Discharge Plan :  Will you be returning to the same living situation after discharge: Yes,  home At discharge, do you have transportation home?:Yes,  husband Do you have the ability to pay for your medications:Yes,  MCD  Release of information consent forms completed and in the chart;  Patient's signature needed at discharge.  Patient to Follow up at: Follow-up Information   Follow up with [facility] On 02/01/2014. (Go to your hospital follow up appointment on Wednesday between 8 and 10 AM)    Contact information:   [place] [phone]      Patient denies SI/HI:   Yes,  yes    Safety Planning and Suicide Prevention discussed:  Yes,  yes  Roque Lias B 01/30/2014, 11:38 AM

## 2014-01-30 NOTE — BHH Suicide Risk Assessment (Signed)
Suicide Risk Assessment  Discharge Assessment     Demographic Factors:  Caucasian  Total Time spent with patient: 45 minutes  Psychiatric Specialty Exam:     Blood pressure 111/67, pulse 103, temperature 97.5 F (36.4 C), temperature source Oral, resp. rate 18, height 5\' 2"  (1.575 m), weight 119.75 kg (264 lb), last menstrual period 01/24/2013, SpO2 97.00%.Body mass index is 48.27 kg/(m^2).  General Appearance: Fairly Groomed  Engineer, water::  Fair  Speech:  Clear and Coherent  Volume:  Normal  Mood:  Anxious  Affect:  anxious  Thought Process:  Coherent and Goal Directed  Orientation:  Full (Time, Place, and Person)  Thought Content:  plans as she is being D/C  Suicidal Thoughts:  No  Homicidal Thoughts:  No  Memory:  Immediate;   Fair Recent;   Fair Remote;   Fair  Judgement:  Fair  Insight:  Present  Psychomotor Activity:  Restlessness  Concentration:  Fair  Recall:  [employer] of Knowledge:NA  Language: Fair  Akathisia:  No  Handed:    AIMS (if indicated):     Assets:  Desire for Improvement Housing Social Support  Sleep:  Number of Hours: 6.25    Musculoskeletal: Strength & Muscle Tone: within normal limits Gait & Station: normal Patient leans: N/A   Mental Status Per Nursing Assessment::   On Admission:     Current Mental Status by Physician: In full contact with reality. There are no active SI plans or intent. Mood is anxious "ready to be D/C" states she wants to go to the pool this afternoon. Will go back to live with her husband. States this is a good relationship   Loss Factors: NA  Historical Factors: NA  Risk Reduction Factors:   Sense of responsibility to family, Living with another person, especially a relative and Positive social support  Continued Clinical Symptoms:  Bipolar Disorder:   Depressive phase  Cognitive Features That Contribute To Risk:  Closed-mindedness Polarized thinking Thought constriction (tunnel vision)     Suicide Risk:  Minimal: No identifiable suicidal ideation.  Patients presenting with no risk factors but with morbid ruminations; may be classified as minimal risk based on the severity of the depressive symptoms  Discharge Diagnoses:   AXIS I:  Schizoaffective Disorder AXIS II:  No diagnosis AXIS III:   Past Medical History  Diagnosis Date  . Bipolar 1 disorder     pt denies, says was misdiagnosed  . Diabetes mellitus   . Fibromyalgia   . Anxiety   . Panic attack   . Chronic back pain   . Chronic chest wall pain   . Chronic right shoulder pain   . Schizoaffective disorder    AXIS IV:  other psychosocial or environmental problems AXIS V:  61-70 mild symptoms  Plan Of Care/Follow-up recommendations:  Activity:  as tolerated Diet:  as per dietitian Follow up outpatient basis Is patient on multiple antipsychotic therapies at discharge:  No   Has Patient had three or more failed trials of antipsychotic monotherapy by history:  No  Recommended Plan for Multiple Antipsychotic Therapies: NA    Chyna Kneece A 01/30/2014, 1:49 PM

## 2014-01-30 NOTE — Tx Team (Signed)
  Interdisciplinary Treatment Plan Update   Date Reviewed:  01/30/2014  Time Reviewed:  11:32 AM  Progress in Treatment:   Attending groups: Yes Participating in groups: Yes Taking medication as prescribed: Yes  Tolerating medication: Yes Family/Significant other contact made: Yes  Patient understands diagnosis: Yes  Discussing patient identified problems/goals with staff: Yes Medical problems stabilized or resolved: Yes Denies suicidal/homicidal ideation: Yes Patient has not harmed self or others: Yes  For review of initial/current patient goals, please see plan of care.  Estimated Length of Stay:  D/c today  Reason for Continuation of Hospitalization:   New Problems/Goals identified:  N/A  Discharge Plan or Barriers:   return home, follow up outpt  Additional Comments:    Attendees:  Signature: Corena Pilgrim, MD 01/30/2014 11:32 AM   Signature: Ripley Fraise, LCSW 01/30/2014 11:32 AM  Signature: Elmarie Shiley, NP 01/30/2014 11:32 AM  Signature: Mayra Neer, RN 01/30/2014 11:32 AM  Signature: Darrol Angel, RN 01/30/2014 11:32 AM  Signature:  01/30/2014 11:32 AM  Signature:   01/30/2014 11:32 AM  Signature:    Signature:    Signature:    Signature:    Signature:    Signature:      Scribe for Treatment Team:   Ripley Fraise, LCSW  01/30/2014 11:32 AM

## 2014-01-30 NOTE — Progress Notes (Signed)
Patient ID: [name], female   DOB: 1965-11-15, 48 y.o.   MRN: [id number] Nursing discharge note:  Patient discharged home per MD order.  Patient received all personal belongings, prescriptions and medication samples.  She denies any SI/HI/AVH.  Reviewed discharge instructions and patient indicated same.  Patient left ambulatory with her husband.
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
## 2014-01-30 NOTE — BHH Suicide Risk Assessment (Signed)
[facility] INPATIENT:  Family/Significant Other Suicide Prevention Education  Suicide Prevention Education:  Education Completed; No one has been identified by the patient as the family member/significant other with whom the patient will be residing, and identified as the person(s) who will aid the patient in the event of a mental health crisis (suicidal ideations/suicide attempt).  With written consent from the patient, the family member/significant other has been provided the following suicide prevention education, prior to the and/or following the discharge of the patient.  The suicide prevention education provided includes the following:  Suicide risk factors  Suicide prevention and interventions  National Suicide Hotline telephone number  [facility] assessment telephone number  [facility] Emergency Assistance [place] and/or Residential Mobile Crisis Unit telephone number  Request made of family/significant other to:  Remove weapons (e.g., guns, rifles, knives), all items previously/currently identified as safety concern.    Remove drugs/medications (over-the-counter, prescriptions, illicit drugs), all items previously/currently identified as a safety concern.  The family member/significant other verbalizes understanding of the suicide prevention education information provided.  The family member/significant other agrees to remove the items of safety concern listed above. The patient did not endorse SI at the time of admission, nor did the patient c/o SI during the stay here.  SPE not required.   Roque Lias B 01/30/2014, 11:37 AM

## 2014-02-01 NOTE — Progress Notes (Signed)
Patient Discharge Instructions:  After Visit Summary (AVS):   Faxed to:  02/01/14 Discharge Summary Note:   Faxed to:  02/01/14 Psychiatric Admission Assessment Note:   Faxed to:  02/01/14 Suicide Risk Assessment - Discharge Assessment:   Faxed to:  02/01/14 Faxed/Sent to the Next Level Care provider:  02/01/14 Faxed to [facility] @ [place], 02/01/2014, 3:44 PM

## 2014-02-24 ENCOUNTER — Encounter (HOSPITAL_COMMUNITY): Payer: Self-pay | Admitting: Emergency Medicine

## 2014-02-24 ENCOUNTER — Emergency Department (HOSPITAL_COMMUNITY)
Admission: EM | Admit: 2014-02-24 | Discharge: 2014-02-24 | Disposition: A | Discharge status: Home / Self Care | Payer: MEDICAID | Attending: Emergency Medicine | Admitting: Emergency Medicine

## 2014-02-24 DIAGNOSIS — R110 Nausea: Secondary | ICD-10-CM | POA: Diagnosis present

## 2014-02-24 DIAGNOSIS — F259 Schizoaffective disorder, unspecified: Secondary | ICD-10-CM | POA: Diagnosis not present

## 2014-02-24 DIAGNOSIS — R197 Diarrhea, unspecified: Secondary | ICD-10-CM | POA: Insufficient documentation

## 2014-02-24 DIAGNOSIS — E119 Type 2 diabetes mellitus without complications: Secondary | ICD-10-CM | POA: Diagnosis not present

## 2014-02-24 DIAGNOSIS — F410 Panic disorder [episodic paroxysmal anxiety] without agoraphobia: Secondary | ICD-10-CM | POA: Diagnosis not present

## 2014-02-24 DIAGNOSIS — F419 Anxiety disorder, unspecified: Secondary | ICD-10-CM

## 2014-02-24 DIAGNOSIS — Z79899 Other long term (current) drug therapy: Secondary | ICD-10-CM | POA: Insufficient documentation

## 2014-02-24 DIAGNOSIS — F17200 Nicotine dependence, unspecified, uncomplicated: Secondary | ICD-10-CM | POA: Insufficient documentation

## 2014-02-24 DIAGNOSIS — G8929 Other chronic pain: Secondary | ICD-10-CM | POA: Diagnosis not present

## 2014-02-24 DIAGNOSIS — IMO0002 Reserved for concepts with insufficient information to code with codable children: Secondary | ICD-10-CM | POA: Insufficient documentation

## 2014-02-24 DIAGNOSIS — F319 Bipolar disorder, unspecified: Secondary | ICD-10-CM | POA: Insufficient documentation

## 2014-02-24 LAB — BASIC METABOLIC PANEL
ANION GAP: 12 (ref 5–15)
BUN: 5 mg/dL — ABNORMAL LOW (ref 6–23)
CO2: 26 mEq/L (ref 19–32)
Calcium: 9.1 mg/dL (ref 8.4–10.5)
Chloride: 103 mEq/L (ref 96–112)
Creatinine, Ser: 0.82 mg/dL (ref 0.50–1.10)
GFR, EST NON AFRICAN AMERICAN: 85 mL/min — AB (ref >90)
Glucose, Bld: 109 mg/dL — ABNORMAL HIGH (ref 70–99)
Potassium: 3.7 mEq/L (ref 3.7–5.3)
SODIUM: 141 meq/L (ref 137–147)

## 2014-02-24 LAB — CBC WITH DIFFERENTIAL/PLATELET
BASOS ABS: 0 10*3/uL (ref 0.0–0.1)
Basophils Relative: 0 % (ref 0–1)
EOS ABS: 0.1 10*3/uL (ref 0.0–0.7)
Eosinophils Relative: 2 % (ref 0–5)
HCT: 40.3 % (ref 36.0–46.0)
Hemoglobin: 14.1 g/dL (ref 12.0–15.0)
Lymphocytes Relative: 32 % (ref 12–46)
Lymphs Abs: 1.9 10*3/uL (ref 0.7–4.0)
MCH: 32.8 pg (ref 26.0–34.0)
MCHC: 35 g/dL (ref 30.0–36.0)
MCV: 93.7 fL (ref 78.0–100.0)
Monocytes Absolute: 0.3 10*3/uL (ref 0.1–1.0)
Monocytes Relative: 5 % (ref 3–12)
Neutro Abs: 3.6 10*3/uL (ref 1.7–7.7)
Neutrophils Relative %: 61 % (ref 43–77)
PLATELETS: 134 10*3/uL — AB (ref 150–400)
RBC: 4.3 MIL/uL (ref 3.87–5.11)
RDW: 13.2 % (ref 11.5–15.5)
WBC: 5.9 10*3/uL (ref 4.0–10.5)

## 2014-02-24 LAB — URINALYSIS, ROUTINE W REFLEX MICROSCOPIC
Bilirubin Urine: NEGATIVE
Glucose, UA: NEGATIVE mg/dL
Hgb urine dipstick: NEGATIVE
KETONES UR: NEGATIVE mg/dL
Nitrite: NEGATIVE
PROTEIN: NEGATIVE mg/dL
Specific Gravity, Urine: 1.015 (ref 1.005–1.030)
Urobilinogen, UA: 1 mg/dL (ref 0.0–1.0)
pH: 7 (ref 5.0–8.0)

## 2014-02-24 LAB — URINE MICROSCOPIC-ADD ON

## 2014-02-24 MED ORDER — PROMETHAZINE HCL 25 MG PO TABS: 25.0000 mg | ORAL_TABLET | Freq: Four times a day (QID) | ORAL | Status: DC | PRN

## 2014-02-24 MED ORDER — ONDANSETRON 4 MG PO TBDP
4.0000 mg | ORAL_TABLET | Freq: Once | ORAL | Status: AC
  Administered 2014-02-24: 4 mg via ORAL
  Filled 2014-02-24: qty 1

## 2014-02-24 MED ORDER — LORAZEPAM 1 MG PO TABS
1.0000 mg | ORAL_TABLET | Freq: Once | ORAL | Status: AC
  Administered 2014-02-24: 1 mg via ORAL
  Filled 2014-02-24: qty 1

## 2014-02-24 MED ORDER — LORAZEPAM 1 MG PO TABS: 1.0000 mg | ORAL_TABLET | Freq: Three times a day (TID) | ORAL | Status: DC | PRN

## 2014-02-24 NOTE — ED Notes (Signed)
Pt alert & oriented x4, stable gait. Patient given discharge instructions, paperwork & prescription(s). Patient  instructed to stop at the registration desk to finish any additional paperwork. Patient verbalized understanding. Pt left department w/ no further questions. 

## 2014-02-24 NOTE — ED Provider Notes (Signed)
CSN: [id number]     Arrival date & time 02/24/14  1558 History   First MD Initiated Contact with Patient 02/24/14 1622     Chief Complaint  Patient presents with  . Nausea  . Diarrhea     (Consider location/radiation/quality/duration/timing/severity/associated sxs/prior Treatment) Patient is a 48 y.o. female presenting with diarrhea. The history is provided by the patient (the pt comlains of anxiety,  nauseu and diarrhea).  Diarrhea Quality:  Watery Severity:  Mild Onset quality:  Sudden Timing:  Intermittent Progression:  Unchanged Relieved by:  Nothing Associated symptoms: no abdominal pain and no headaches     Past Medical History  Diagnosis Date  . Bipolar 1 disorder     pt denies, says was misdiagnosed  . Diabetes mellitus   . Fibromyalgia   . Anxiety   . Panic attack   . Chronic back pain   . Chronic chest wall pain   . Chronic right shoulder pain   . Schizoaffective disorder    Past Surgical History  Procedure Laterality Date  . Mandible surgery    . Cholecystectomy     Family History  Problem Relation Age of Onset  . Heart failure Mother   . Heart failure Other    History  Substance Use Topics  . Smoking status: Current Every Day Smoker -- 1.00 packs/day for 30 years    Types: Cigarettes  . Smokeless tobacco: Never Used  . Alcohol Use: No   OB History   Grav Para Term Preterm Abortions TAB SAB Ect Mult Living   4    2  2         Review of Systems  Constitutional: Negative for appetite change and fatigue.  HENT: Negative for congestion, ear discharge and sinus pressure.   Eyes: Negative for discharge.  Respiratory: Negative for cough.   Cardiovascular: Negative for chest pain.  Gastrointestinal: Positive for nausea and diarrhea. Negative for abdominal pain.  Genitourinary: Negative for frequency and hematuria.  Musculoskeletal: Negative for back pain.  Skin: Negative for rash.  Neurological: Negative for seizures and headaches.   Psychiatric/Behavioral: Positive for agitation. Negative for hallucinations.      Allergies  Cocaine; Procaine hcl; Aripiprazole; Cogentin; Geodon; and Haldol  Home Medications   Prior to Admission medications   Medication Sig Start Date End Date Taking? Authorizing Provider  busPIRone (BUSPAR) 10 MG tablet Take 1 tablet (10 mg total) by mouth 2 (two) times daily. 01/30/14   Elmarie Shiley, NP  carbamazepine (TEGRETOL XR) 200 MG 12 hr tablet Take 1 tablet (200 mg total) by mouth 2 (two) times daily. 01/30/14   Elmarie Shiley, NP  LORazepam (ATIVAN) 1 MG tablet Take 1 tablet (1 mg total) by mouth every 8 (eight) hours as needed for anxiety. 02/24/14   Maudry Diego, MD  OLANZapine (ZYPREXA) 5 MG tablet Take 1 tablet (5 mg total) by mouth at bedtime. 01/30/14   Elmarie Shiley, NP  promethazine (PHENERGAN) 25 MG tablet Take 1 tablet (25 mg total) by mouth every 6 (six) hours as needed for nausea or vomiting. 02/24/14   Maudry Diego, MD  traZODone (DESYREL) 100 MG tablet Take 1 tablet (100 mg total) by mouth at bedtime. 01/30/14   Elmarie Shiley, NP   BP 140/94  Pulse 87  Temp(Src) 98.2 F (36.8 C) (Oral)  Resp 20  SpO2 98%  LMP 01/24/2013 Physical Exam  Constitutional: She is oriented to person, place, and time. She appears well-developed.  HENT:  Head: Normocephalic.  Eyes:  Conjunctivae and EOM are normal. No scleral icterus.  Neck: Neck supple. No thyromegaly present.  Cardiovascular: Normal rate and regular rhythm.  Exam reveals no gallop and no friction rub.   No murmur heard. Pulmonary/Chest: No stridor. She has no wheezes. She has no rales. She exhibits no tenderness.  Abdominal: She exhibits no distension. There is no tenderness. There is no rebound.  Musculoskeletal: Normal range of motion. She exhibits no edema.  Lymphadenopathy:    She has no cervical adenopathy.  Neurological: She is oriented to person, place, and time. She exhibits normal muscle tone. Coordination normal.  Skin: No  rash noted. No erythema.  Psychiatric: Her behavior is normal.  anxious    ED Course  Procedures (including critical care time) Labs Review Labs Reviewed  CBC WITH DIFFERENTIAL - Abnormal; Notable for the following:    Platelets 134 (*)    All other components within normal limits  BASIC METABOLIC PANEL - Abnormal; Notable for the following:    Glucose, Bld 109 (*)    BUN 5 (*)    GFR calc non Af Amer 85 (*)    All other components within normal limits  URINALYSIS, ROUTINE W REFLEX MICROSCOPIC - Abnormal; Notable for the following:    Leukocytes, UA TRACE (*)    All other components within normal limits  URINE MICROSCOPIC-ADD ON    Imaging Review No results found.   EKG Interpretation None      MDM   Final diagnoses:  Anxiety   The chart was scribed for me under my direct supervision.  I personally performed the history, physical, and medical decision making and all procedures in the evaluation of this patient.Maudry Diego, MD 02/24/14 [phone]
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
## 2014-02-24 NOTE — ED Notes (Signed)
Pt reports she has not been taking her medications and has not been to [facility] for follow-ups due to not having a ride. Pt is rambling with sentences running together. Reports "the people I live with want me dead". Pt is disheveled.

## 2014-02-24 NOTE — Discharge Instructions (Signed)
Follow up as needed

## 2014-03-02 ENCOUNTER — Encounter (HOSPITAL_COMMUNITY): Payer: Self-pay | Admitting: Emergency Medicine

## 2014-03-02 ENCOUNTER — Emergency Department (HOSPITAL_COMMUNITY)
Admission: EM | Admit: 2014-03-02 | Discharge: 2014-03-02 | Disposition: A | Discharge status: Home / Self Care | Payer: Medicaid Other | Attending: Emergency Medicine | Admitting: Emergency Medicine

## 2014-03-02 DIAGNOSIS — IMO0001 Reserved for inherently not codable concepts without codable children: Secondary | ICD-10-CM | POA: Diagnosis not present

## 2014-03-02 DIAGNOSIS — Z79899 Other long term (current) drug therapy: Secondary | ICD-10-CM | POA: Insufficient documentation

## 2014-03-02 DIAGNOSIS — H9209 Otalgia, unspecified ear: Secondary | ICD-10-CM | POA: Diagnosis present

## 2014-03-02 DIAGNOSIS — G8929 Other chronic pain: Secondary | ICD-10-CM | POA: Insufficient documentation

## 2014-03-02 DIAGNOSIS — E119 Type 2 diabetes mellitus without complications: Secondary | ICD-10-CM | POA: Diagnosis not present

## 2014-03-02 DIAGNOSIS — M26629 Arthralgia of temporomandibular joint, unspecified side: Secondary | ICD-10-CM | POA: Insufficient documentation

## 2014-03-02 DIAGNOSIS — F17200 Nicotine dependence, unspecified, uncomplicated: Secondary | ICD-10-CM | POA: Diagnosis not present

## 2014-03-02 DIAGNOSIS — I509 Heart failure, unspecified: Secondary | ICD-10-CM | POA: Insufficient documentation

## 2014-03-02 DIAGNOSIS — F259 Schizoaffective disorder, unspecified: Secondary | ICD-10-CM | POA: Insufficient documentation

## 2014-03-02 DIAGNOSIS — F319 Bipolar disorder, unspecified: Secondary | ICD-10-CM | POA: Insufficient documentation

## 2014-03-02 DIAGNOSIS — F410 Panic disorder [episodic paroxysmal anxiety] without agoraphobia: Secondary | ICD-10-CM | POA: Diagnosis not present

## 2014-03-02 HISTORY — DX: Heart failure, unspecified: I50.9

## 2014-03-02 MED ORDER — HYDROCODONE-ACETAMINOPHEN 5-325 MG PO TABS: 2.0000 | ORAL_TABLET | Freq: Every evening | ORAL | Status: DC | PRN

## 2014-03-02 MED ORDER — IBUPROFEN 400 MG PO TABS: 400.0000 mg | ORAL_TABLET | Freq: Four times a day (QID) | ORAL | Status: DC | PRN

## 2014-03-02 NOTE — ED Notes (Signed)
Pt given pre-pack of Norco. Verbalized understanding of administration instructions. Nad noted.

## 2014-03-02 NOTE — ED Provider Notes (Signed)
CSN: [id number]     Arrival date & time 03/02/14  0343 History   First MD Initiated Contact with Patient 03/02/14 [phone]     Chief Complaint  Patient presents with  . Otalgia     (Consider location/radiation/quality/duration/timing/severity/associated sxs/prior Treatment) HPI Several months of constant severe  24/7 right TMJ region pain worse with palpation worse with swallowing worse with chewing worse with opening her mouth with pain radiating down the right side of her neck it to the right temple region and toward the right ear as well constant severe without associated symptoms with no fevers no drooling no stridor no voice change no neck swelling and no change in her baseline chronic generalized body aches chronic back pain chronic right shoulder pain or chronic chest pain.  Tender right TMJ, right tympanic membrane clear  Past Medical History  Diagnosis Date  . Bipolar 1 disorder     pt denies, says was misdiagnosed  . Diabetes mellitus   . Fibromyalgia   . Anxiety   . Panic attack   . Chronic back pain   . Chronic chest wall pain   . Chronic right shoulder pain   . Schizoaffective disorder   . CHF (congestive heart failure)    Past Surgical History  Procedure Laterality Date  . Mandible surgery    . Cholecystectomy     Family History  Problem Relation Age of Onset  . Heart failure Mother   . Heart failure Other    History  Substance Use Topics  . Smoking status: Current Every Day Smoker -- 1.00 packs/day for 30 years    Types: Cigarettes  . Smokeless tobacco: Never Used  . Alcohol Use: No   OB History   Grav Para Term Preterm Abortions TAB SAB Ect Mult Living   4    2  2         Review of Systems 10 Systems reviewed and are negative for acute change except as noted in the HPI.   Allergies  Cocaine; Procaine hcl; Aripiprazole; Cogentin; Geodon; and Haldol  Home Medications   Prior to Admission medications   Medication Sig Start Date End Date Taking?  Authorizing Provider  busPIRone (BUSPAR) 10 MG tablet Take 1 tablet (10 mg total) by mouth 2 (two) times daily. 01/30/14   Elmarie Shiley, NP  carbamazepine (TEGRETOL XR) 200 MG 12 hr tablet Take 1 tablet (200 mg total) by mouth 2 (two) times daily. 01/30/14   Elmarie Shiley, NP  HYDROcodone-acetaminophen (NORCO) 5-325 MG per tablet Take 2 tablets by mouth at bedtime as needed for severe pain. 03/02/14   Babette Relic, MD  ibuprofen (ADVIL,MOTRIN) 400 MG tablet Take 1 tablet (400 mg total) by mouth every 6 (six) hours as needed. 03/02/14   Babette Relic, MD  LORazepam (ATIVAN) 1 MG tablet Take 1 tablet (1 mg total) by mouth every 8 (eight) hours as needed for anxiety. 02/24/14   Maudry Diego, MD  OLANZapine (ZYPREXA) 5 MG tablet Take 1 tablet (5 mg total) by mouth at bedtime. 01/30/14   Elmarie Shiley, NP  promethazine (PHENERGAN) 25 MG tablet Take 1 tablet (25 mg total) by mouth every 6 (six) hours as needed for nausea or vomiting. 02/24/14   Maudry Diego, MD  traZODone (DESYREL) 100 MG tablet Take 1 tablet (100 mg total) by mouth at bedtime. 01/30/14   Elmarie Shiley, NP   BP 132/60  Pulse 82  Temp(Src) 97.6 F (36.4 C) (Oral)  Resp 20  SpO2 99%  LMP 01/24/2013 Physical Exam  Nursing note and vitals reviewed. Constitutional:  Awake, alert, nontoxic appearance.  HENT:  Head: Atraumatic.  Mouth/Throat: Oropharynx is clear and moist. No oropharyngeal exudate.  TMs and EACs clear; right TMJ tender; right mastoid region NT; gingiva/teeth NT; no trismus  Eyes: Right eye exhibits no discharge. Left eye exhibits no discharge.  Neck: Neck supple.  Cardiovascular: Normal rate and regular rhythm.   No murmur heard. Pulmonary/Chest: Effort normal and breath sounds normal. No respiratory distress. She has no wheezes. She has no rales. She exhibits no tenderness.  Abdominal: Soft. There is no tenderness. There is no rebound.  Musculoskeletal: She exhibits tenderness.  Baseline ROM, no obvious new focal  weakness.Baseline diffuse tenderness.  Neurological: She is alert.  Mental status and motor strength appears baseline for patient and situation.  Skin: No rash noted.  Psychiatric: She has a normal mood and affect.    ED Course  Procedures (including critical care time) Labs Review Labs Reviewed - No data to display  Imaging Review No results found.   EKG Interpretation None      MDM   Final diagnoses:  TMJ arthralgia    Patient informed of clinical course, understand medical decision-making process, and agree with plan.  I doubt any other EMC precluding discharge at this time including, but not necessarily limited to the following:SBI.    Babette Relic, MD 03/04/14 [phone]

## 2014-03-02 NOTE — ED Notes (Signed)
Pt ambulated to bathroom with no difficulty. Nad noted at present.  

## 2014-03-02 NOTE — Discharge Instructions (Signed)
SEEK MEDICAL ATTENTION IF: The exam and treatment you received today has been provided on an emergency basis only. This is not a substitute for complete medical or dental care. If your problem worsens or new symptoms (problems) appear, and you are unable to arrange prompt follow-up care with your dentist, call or return to this location. CALL YOUR DENTIST OR RETURN IMMEDIATELY IF you develop a fever, rash, difficulty breathing or swallowing, neck or facial swelling, or other potentially serious concerns.

## 2014-03-02 NOTE — ED Notes (Signed)
Pt c/o left ear ache, pain with swallowing, and sinus pain. States she has sinus infections on and off for a couple of months with ear pain. Reports pain has increased over the last couple of days.

## 2014-03-02 NOTE — ED Notes (Signed)
MD at bedside. 

## 2014-03-06 MED FILL — Hydrocodone-Acetaminophen Tab 5-325 MG: ORAL | Qty: 6 | Status: AC

## 2014-03-13 ENCOUNTER — Encounter (HOSPITAL_COMMUNITY): Payer: Self-pay | Admitting: Emergency Medicine

## 2014-03-13 ENCOUNTER — Emergency Department (HOSPITAL_COMMUNITY)
Admission: EM | Admit: 2014-03-13 | Discharge: 2014-03-13 | Disposition: A | Discharge status: Home / Self Care | Payer: Medicaid Other | Attending: Emergency Medicine | Admitting: Emergency Medicine

## 2014-03-13 DIAGNOSIS — F17200 Nicotine dependence, unspecified, uncomplicated: Secondary | ICD-10-CM | POA: Insufficient documentation

## 2014-03-13 DIAGNOSIS — Y929 Unspecified place or not applicable: Secondary | ICD-10-CM | POA: Insufficient documentation

## 2014-03-13 DIAGNOSIS — Y9389 Activity, other specified: Secondary | ICD-10-CM | POA: Diagnosis not present

## 2014-03-13 DIAGNOSIS — S46909A Unspecified injury of unspecified muscle, fascia and tendon at shoulder and upper arm level, unspecified arm, initial encounter: Secondary | ICD-10-CM | POA: Diagnosis not present

## 2014-03-13 DIAGNOSIS — S335XXA Sprain of ligaments of lumbar spine, initial encounter: Secondary | ICD-10-CM | POA: Insufficient documentation

## 2014-03-13 DIAGNOSIS — X503XXA Overexertion from repetitive movements, initial encounter: Secondary | ICD-10-CM | POA: Diagnosis not present

## 2014-03-13 DIAGNOSIS — E119 Type 2 diabetes mellitus without complications: Secondary | ICD-10-CM | POA: Diagnosis not present

## 2014-03-13 DIAGNOSIS — F410 Panic disorder [episodic paroxysmal anxiety] without agoraphobia: Secondary | ICD-10-CM | POA: Insufficient documentation

## 2014-03-13 DIAGNOSIS — F259 Schizoaffective disorder, unspecified: Secondary | ICD-10-CM | POA: Diagnosis not present

## 2014-03-13 DIAGNOSIS — I509 Heart failure, unspecified: Secondary | ICD-10-CM | POA: Diagnosis not present

## 2014-03-13 DIAGNOSIS — IMO0002 Reserved for concepts with insufficient information to code with codable children: Secondary | ICD-10-CM | POA: Diagnosis present

## 2014-03-13 DIAGNOSIS — G8929 Other chronic pain: Secondary | ICD-10-CM | POA: Diagnosis not present

## 2014-03-13 DIAGNOSIS — X500XXA Overexertion from strenuous movement or load, initial encounter: Secondary | ICD-10-CM | POA: Diagnosis not present

## 2014-03-13 DIAGNOSIS — S39012A Strain of muscle, fascia and tendon of lower back, initial encounter: Secondary | ICD-10-CM

## 2014-03-13 DIAGNOSIS — Z791 Long term (current) use of non-steroidal anti-inflammatories (NSAID): Secondary | ICD-10-CM | POA: Insufficient documentation

## 2014-03-13 DIAGNOSIS — Z79899 Other long term (current) drug therapy: Secondary | ICD-10-CM | POA: Insufficient documentation

## 2014-03-13 DIAGNOSIS — Z760 Encounter for issue of repeat prescription: Secondary | ICD-10-CM | POA: Diagnosis not present

## 2014-03-13 DIAGNOSIS — S4980XA Other specified injuries of shoulder and upper arm, unspecified arm, initial encounter: Secondary | ICD-10-CM | POA: Diagnosis not present

## 2014-03-13 DIAGNOSIS — F319 Bipolar disorder, unspecified: Secondary | ICD-10-CM | POA: Insufficient documentation

## 2014-03-13 MED ORDER — ALBUTEROL SULFATE HFA 108 (90 BASE) MCG/ACT IN AERS: 1.0000 | INHALATION_SPRAY | Freq: Four times a day (QID) | RESPIRATORY_TRACT | Status: DC | PRN

## 2014-03-13 MED ORDER — METHOCARBAMOL 500 MG PO TABS: 1000.0000 mg | ORAL_TABLET | Freq: Four times a day (QID) | ORAL | Status: AC

## 2014-03-13 NOTE — ED Notes (Signed)
Lower back pain chronic worsening x2 days. PT ambulated to room with no difficulty.

## 2014-03-13 NOTE — Discharge Instructions (Signed)
Lumbosacral Strain Lumbosacral strain is a strain of any of the parts that make up your lumbosacral vertebrae. Your lumbosacral vertebrae are the bones that make up the lower third of your backbone. Your lumbosacral vertebrae are held together by muscles and tough, fibrous tissue (ligaments).  CAUSES  A sudden blow to your back can cause lumbosacral strain. Also, anything that causes an excessive stretch of the muscles in the low back can cause this strain. This is typically seen when people exert themselves strenuously, fall, lift heavy objects, bend, or crouch repeatedly. RISK FACTORS  Physically demanding work.  Participation in pushing or pulling sports or sports that require a sudden twist of the back (tennis, golf, baseball).  Weight lifting.  Excessive lower back curvature.  Forward-tilted pelvis.  Weak back or abdominal muscles or both.  Tight hamstrings. SIGNS AND SYMPTOMS  Lumbosacral strain may cause pain in the area of your injury or pain that moves (radiates) down your leg.  DIAGNOSIS Your health care provider can often diagnose lumbosacral strain through a physical exam. In some cases, you may need tests such as X-ray exams.  TREATMENT  Treatment for your lower back injury depends on many factors that your clinician will have to evaluate. However, most treatment will include the use of anti-inflammatory medicines. HOME CARE INSTRUCTIONS   Avoid hard physical activities (tennis, racquetball, waterskiing) if you are not in proper physical condition for it. This may aggravate or create problems.  If you have a back problem, avoid sports requiring sudden body movements. Swimming and walking are generally safer activities.  Maintain good posture.  Maintain a healthy weight.  For acute conditions, you may put ice on the injured area.  Put ice in a plastic bag.  Place a towel between your skin and the bag.  Leave the ice on for 20 minutes, 2-3 times a day.  When the  low back starts healing, stretching and strengthening exercises may be recommended. SEEK MEDICAL CARE IF:  Your back pain is getting worse.  You experience severe back pain not relieved with medicines. SEEK IMMEDIATE MEDICAL CARE IF:   You have numbness, tingling, weakness, or problems with the use of your arms or legs.  There is a change in bowel or bladder control.  You have increasing pain in any area of the body, including your belly (abdomen).  You notice shortness of breath, dizziness, or feel faint.  You feel sick to your stomach (nauseous), are throwing up (vomiting), or become sweaty.  You notice discoloration of your toes or legs, or your feet get very cold. MAKE SURE YOU:   Understand these instructions.  Will watch your condition.  Will get help right away if you are not doing well or get worse. Document Released: 04/23/2005 Document Revised: 07/19/2013 Document Reviewed: 03/02/2013 [facility] Patient Information 2015 [place], [place]. This information is not intended to replace advice given to you by your health care provider. Make sure you discuss any questions you have with your health care provider.  Medication Refill, Emergency Department We have refilled your medication today as a courtesy to you. It is best for your medical care, however, to take care of getting refills done through your primary caregiver's office. They have your records and can do a better job of follow-up than we can in the emergency department. On maintenance medications, we often only prescribe enough medications to get you by until you are able to see your regular caregiver. This is a more expensive way to refill medications.  In the future, please plan for refills so that you will not have to use the emergency department for this. Thank you for your help. Your help allows [place] to better take care of the daily emergencies that enter our department. Document Released: 10/31/2003 Document Revised:  10/06/2011 Document Reviewed: 10/21/2013 [facility] Patient Information 2015 [place], [place]. This information is not intended to replace advice given to you by your health care provider. Make sure you discuss any questions you have with your health care provider.

## 2014-03-13 NOTE — ED Notes (Signed)
Pt reports lower back pain x 2 days and right shoulder pain from an injured rotator cuff.

## 2014-03-13 NOTE — ED Provider Notes (Signed)
CSN: [id number]     Arrival date & time 03/13/14  1622 History  This chart was scribed for non-physician practitioner Evalee Jefferson, PA-C working with [place], DO by Ludger Nutting, ED Scribe. This patient was seen in room APFT20/APFT20 and the patient's care was started at 5:55 PM.    Chief Complaint  Patient presents with  . Back Pain  . Shoulder Pain    The history is provided by the patient. No language interpreter was used.    HPI Comments: [name] is a 48 y.o. female with past medical history of chronic back pain, sciatica who presents to the Emergency Department complaining of constant, unchanged bilateral lower back pain that began 2 days ago. Patient states she lifting heavy bags prior to the onset of pain. She has taken Aleve, ibuprofen 400 mg without relief. She states rest alleviates the pain, movement and activity aggravates the pain. She denies lower extremity numbness or weakness, bowel/bladder incontinence.   Patient also complains of chronic right shoulder pain that worsened today. Patient describes her pain as a burning sensation. She has a known rotator cuff injury and is awaiting orthopedic evaluation which has not yet been arranged.  Her pain is not new or different today.  She denies weakness or numbness in her arm or hand.  Past Medical History  Diagnosis Date  . Bipolar 1 disorder     pt denies, says was misdiagnosed  . Diabetes mellitus   . Fibromyalgia   . Anxiety   . Panic attack   . Chronic back pain   . Chronic chest wall pain   . Chronic right shoulder pain   . Schizoaffective disorder   . CHF (congestive heart failure)    Past Surgical History  Procedure Laterality Date  . Mandible surgery    . Cholecystectomy     Family History  Problem Relation Age of Onset  . Heart failure Mother   . Heart failure Other    History  Substance Use Topics  . Smoking status: Current Every Day Smoker -- 1.00 packs/day for 30 years    Types: Cigarettes   . Smokeless tobacco: Never Used  . Alcohol Use: No   OB History   Grav Para Term Preterm Abortions TAB SAB Ect Mult Living   4    2  2         Review of Systems  Constitutional: Negative for fever.  Respiratory: Negative for shortness of breath.   Cardiovascular: Negative for chest pain and leg swelling.  Gastrointestinal: Negative for abdominal pain, constipation and abdominal distention.  Genitourinary: Negative for dysuria, urgency, frequency, flank pain and difficulty urinating.  Musculoskeletal: Positive for arthralgias and back pain. Negative for gait problem and joint swelling.  Skin: Negative for rash.  Neurological: Negative for weakness and numbness.      Allergies  Cocaine; Procaine hcl; Aripiprazole; Cogentin; Geodon; and Haldol  Home Medications   Prior to Admission medications   Medication Sig Start Date End Date Taking? Authorizing Provider  albuterol (PROVENTIL HFA;VENTOLIN HFA) 108 (90 BASE) MCG/ACT inhaler Inhale 1-2 puffs into the lungs every 6 (six) hours as needed for wheezing or shortness of breath. 03/13/14   Evalee Jefferson, PA-C  busPIRone (BUSPAR) 10 MG tablet Take 1 tablet (10 mg total) by mouth 2 (two) times daily. 01/30/14   Elmarie Shiley, NP  carbamazepine (TEGRETOL XR) 200 MG 12 hr tablet Take 1 tablet (200 mg total) by mouth 2 (two) times daily. 01/30/14  Elmarie Shiley, NP  HYDROcodone-acetaminophen (NORCO) 5-325 MG per tablet Take 2 tablets by mouth at bedtime as needed for severe pain. 03/02/14   Babette Relic, MD  ibuprofen (ADVIL,MOTRIN) 400 MG tablet Take 1 tablet (400 mg total) by mouth every 6 (six) hours as needed. 03/02/14   Babette Relic, MD  LORazepam (ATIVAN) 1 MG tablet Take 1 tablet (1 mg total) by mouth every 8 (eight) hours as needed for anxiety. 02/24/14   Maudry Diego, MD  methocarbamol (ROBAXIN) 500 MG tablet Take 2 tablets (1,000 mg total) by mouth 4 (four) times daily. 03/13/14 03/23/14  Evalee Jefferson, PA-C  OLANZapine (ZYPREXA) 5 MG tablet Take 1  tablet (5 mg total) by mouth at bedtime. 01/30/14   Elmarie Shiley, NP  promethazine (PHENERGAN) 25 MG tablet Take 1 tablet (25 mg total) by mouth every 6 (six) hours as needed for nausea or vomiting. 02/24/14   Maudry Diego, MD  traZODone (DESYREL) 100 MG tablet Take 1 tablet (100 mg total) by mouth at bedtime. 01/30/14   Elmarie Shiley, NP   BP 132/78  Pulse 84  Temp(Src) 98.2 F (36.8 C) (Oral)  Resp 18  Ht 5\' 1"  (1.549 m)  Wt 264 lb (119.75 kg)  BMI 49.91 kg/m2  SpO2 95%  LMP 01/24/2013 Physical Exam  Nursing note and vitals reviewed. Constitutional: She appears well-developed and well-nourished.  HENT:  Head: Normocephalic.  Eyes: Conjunctivae are normal.  Neck: Normal range of motion. Neck supple.  Cardiovascular: Normal rate and intact distal pulses.   Pedal pulses normal.  Pulmonary/Chest: Effort normal.  Abdominal: Soft. Bowel sounds are normal. She exhibits no distension and no mass.  Musculoskeletal: Normal range of motion. She exhibits no edema.       Right shoulder: She exhibits bony tenderness. She exhibits normal range of motion, no swelling, no effusion, no crepitus, no deformity and normal pulse.       Lumbar back: She exhibits tenderness. She exhibits no swelling, no edema and no spasm.  Neurological: She is alert. She has normal strength. She displays no atrophy and no tremor. No sensory deficit. Gait normal.  Reflex Scores:      Patellar reflexes are 1+ on the right side and 1+ on the left side.      Achilles reflexes are 2+ on the right side and 2+ on the left side. No strength deficit noted in hip and knee flexor and extensor muscle groups.  Ankle flexion and extension intact. Negative SLR bilaterally.   Skin: Skin is warm and dry.  Psychiatric: She has a normal mood and affect.    ED Course  Procedures (including critical care time)  DIAGNOSTIC STUDIES: Oxygen Saturation is 98% on RA, normal by my interpretation.    COORDINATION OF CARE: 6:00 PM Discussed  treatment plan with pt at bedside and pt agreed to plan.   Labs Review Labs Reviewed - No data to display  Imaging Review No results found.   EKG Interpretation None      MDM   Final diagnoses:  Lumbar strain, initial encounter  Medication refill    No neuro deficit on exam or by history to suggest emergent or surgical presentation.  Also discussed worsened sx that should prompt immediate re-evaluation including distal weakness, bowel/bladder retention/incontinence.  Prior to dc, pt also requested refill prescription for her albuterol as hers has expired.  She denies current respiratory complaint.  This was provided.  Referrals given for f/u care.  Prescribed robaxin for  low back pain, encouraged continued nsaids, heat.  Prn f/u   I personally performed the services described in this documentation, which was scribed in my presence. The recorded information has been reviewed and is accurate.   Evalee Jefferson, PA-C 03/15/14 1248

## 2014-03-17 NOTE — ED Provider Notes (Signed)
Medical screening examination/treatment/procedure(s) were performed by non-physician practitioner and as supervising physician I was immediately available for consultation/collaboration.   EKG Interpretation None        Delice Bison Lameshia Hypolite, DO 03/17/14 1005

## 2014-04-15 ENCOUNTER — Emergency Department (HOSPITAL_COMMUNITY)
Admission: EM | Admit: 2014-04-15 | Discharge: 2014-04-15 | Disposition: A | Discharge status: Home / Self Care | Payer: Medicaid Other | Attending: Emergency Medicine | Admitting: Emergency Medicine

## 2014-04-15 ENCOUNTER — Encounter (HOSPITAL_COMMUNITY): Payer: Self-pay | Admitting: Emergency Medicine

## 2014-04-15 DIAGNOSIS — Z79899 Other long term (current) drug therapy: Secondary | ICD-10-CM | POA: Insufficient documentation

## 2014-04-15 DIAGNOSIS — R21 Rash and other nonspecific skin eruption: Secondary | ICD-10-CM | POA: Diagnosis not present

## 2014-04-15 DIAGNOSIS — H5789 Other specified disorders of eye and adnexa: Secondary | ICD-10-CM | POA: Insufficient documentation

## 2014-04-15 DIAGNOSIS — I509 Heart failure, unspecified: Secondary | ICD-10-CM | POA: Diagnosis not present

## 2014-04-15 DIAGNOSIS — J0100 Acute maxillary sinusitis, unspecified: Secondary | ICD-10-CM | POA: Diagnosis not present

## 2014-04-15 DIAGNOSIS — Z791 Long term (current) use of non-steroidal anti-inflammatories (NSAID): Secondary | ICD-10-CM | POA: Diagnosis not present

## 2014-04-15 DIAGNOSIS — R51 Headache: Secondary | ICD-10-CM | POA: Insufficient documentation

## 2014-04-15 DIAGNOSIS — F259 Schizoaffective disorder, unspecified: Secondary | ICD-10-CM | POA: Insufficient documentation

## 2014-04-15 DIAGNOSIS — E119 Type 2 diabetes mellitus without complications: Secondary | ICD-10-CM | POA: Diagnosis not present

## 2014-04-15 DIAGNOSIS — R110 Nausea: Secondary | ICD-10-CM | POA: Diagnosis not present

## 2014-04-15 DIAGNOSIS — IMO0001 Reserved for inherently not codable concepts without codable children: Secondary | ICD-10-CM | POA: Diagnosis not present

## 2014-04-15 DIAGNOSIS — F410 Panic disorder [episodic paroxysmal anxiety] without agoraphobia: Secondary | ICD-10-CM | POA: Insufficient documentation

## 2014-04-15 DIAGNOSIS — G8929 Other chronic pain: Secondary | ICD-10-CM | POA: Diagnosis not present

## 2014-04-15 DIAGNOSIS — F319 Bipolar disorder, unspecified: Secondary | ICD-10-CM | POA: Insufficient documentation

## 2014-04-15 DIAGNOSIS — F17200 Nicotine dependence, unspecified, uncomplicated: Secondary | ICD-10-CM | POA: Diagnosis not present

## 2014-04-15 MED ORDER — AMOXICILLIN-POT CLAVULANATE 875-125 MG PO TABS: 1.0000 | ORAL_TABLET | Freq: Two times a day (BID) | ORAL | Status: DC

## 2014-04-15 MED ORDER — HYDROCODONE-ACETAMINOPHEN 5-325 MG PO TABS: 1.0000 | ORAL_TABLET | ORAL | Status: DC | PRN

## 2014-04-15 MED ORDER — OXYCODONE-ACETAMINOPHEN 5-325 MG PO TABS
1.0000 | ORAL_TABLET | Freq: Once | ORAL | Status: AC
  Administered 2014-04-15: 1 via ORAL
  Filled 2014-04-15: qty 1

## 2014-04-15 NOTE — ED Notes (Signed)
Pt reports sinus pain/congestion and "knots in my head" for " quite a while". Pt recently treated with antbx for similar sx

## 2014-04-15 NOTE — ED Provider Notes (Signed)
CSN: [id number]     Arrival date & time 04/15/14  1036 History   First MD Initiated Contact with Patient 04/15/14 1108     Chief Complaint  Patient presents with  . Facial Pain     (Consider location/radiation/quality/duration/timing/severity/associated sxs/prior Treatment) Patient is a 48 y.o. female presenting with URI. The history is provided by the patient.  URI Presenting symptoms: congestion, ear pain, facial pain, rhinorrhea and sore throat   Presenting symptoms: no cough, no fatigue and no fever   Severity:  Moderate Onset quality:  Gradual Duration:  3 days Timing:  Constant Progression:  Worsening Chronicity:  New Relieved by:  Nothing Worsened by:  Nothing tried Ineffective treatments:  OTC medications Associated symptoms: headaches, myalgias, sinus pain and sneezing   Risk factors: sick contacts    [name] is a 48 y.o. female who presents to the ED with sinus congestion and sinus pain that has gotten worse over the past 3 days. She states sh has had fever and chills, nausea but no vomiting. She also reports knots in her scalp for years that come and go. She also has some areas to her right breast and axilla that start as bites or blisters and then scab over. She denies having been on any antibiotics in years.  She has multiple medical problems and is [facility].   Past Medical History  Diagnosis Date  . Bipolar 1 disorder     pt denies, says was misdiagnosed  . Diabetes mellitus   . Fibromyalgia   . Anxiety   . Panic attack   . Chronic back pain   . Chronic chest wall pain   . Chronic right shoulder pain   . Schizoaffective disorder   . CHF (congestive heart failure)    Past Surgical History  Procedure Laterality Date  . Mandible surgery    . Cholecystectomy     Family History  Problem Relation Age of Onset  . Heart failure Mother   . Heart failure Other    History  Substance Use Topics  . Smoking status: Current Every  Day Smoker -- 1.00 packs/day for 30 years    Types: Cigarettes  . Smokeless tobacco: Never Used  . Alcohol Use: No   OB History   Grav Para Term Preterm Abortions TAB SAB Ect Mult Living   4    2  2         Review of Systems  Constitutional: Positive for chills. Negative for fever and fatigue.  HENT: Positive for congestion, ear pain, rhinorrhea, sneezing and sore throat.   Eyes: Positive for redness and itching. Negative for visual disturbance.  Respiratory: Negative for cough. Shortness of breath: chronic.   Cardiovascular: Negative for chest pain.       Hx of CHF  Gastrointestinal: Positive for nausea. Negative for vomiting and abdominal pain.  Genitourinary: Negative for dysuria, urgency and frequency.  Musculoskeletal: Positive for myalgias.  Skin: Positive for rash.  Neurological: Positive for headaches.  Psychiatric/Behavioral: Nervous/anxious: hx of anxiety, bipolar and schizoaffective disorder.       Allergies  Cocaine; Procaine hcl; Aripiprazole; Cogentin; Geodon; and Haldol  Home Medications   Prior to Admission medications   Medication Sig Start Date End Date Taking? Authorizing Provider  albuterol (PROVENTIL HFA;VENTOLIN HFA) 108 (90 BASE) MCG/ACT inhaler Inhale 1-2 puffs into the lungs every 6 (six) hours as needed for wheezing or shortness of breath. 03/13/14   Evalee Jefferson, PA-C  busPIRone (BUSPAR) 10 MG  tablet Take 1 tablet (10 mg total) by mouth 2 (two) times daily. 01/30/14   Elmarie Shiley, NP  carbamazepine (TEGRETOL XR) 200 MG 12 hr tablet Take 1 tablet (200 mg total) by mouth 2 (two) times daily. 01/30/14   Elmarie Shiley, NP  HYDROcodone-acetaminophen (NORCO) 5-325 MG per tablet Take 2 tablets by mouth at bedtime as needed for severe pain. 03/02/14   Babette Relic, MD  ibuprofen (ADVIL,MOTRIN) 400 MG tablet Take 1 tablet (400 mg total) by mouth every 6 (six) hours as needed. 03/02/14   Babette Relic, MD  LORazepam (ATIVAN) 1 MG tablet Take 1 tablet (1 mg total) by mouth  every 8 (eight) hours as needed for anxiety. 02/24/14   Maudry Diego, MD  OLANZapine (ZYPREXA) 5 MG tablet Take 1 tablet (5 mg total) by mouth at bedtime. 01/30/14   Elmarie Shiley, NP  promethazine (PHENERGAN) 25 MG tablet Take 1 tablet (25 mg total) by mouth every 6 (six) hours as needed for nausea or vomiting. 02/24/14   Maudry Diego, MD  traZODone (DESYREL) 100 MG tablet Take 1 tablet (100 mg total) by mouth at bedtime. 01/30/14   Elmarie Shiley, NP   BP 160/97  Pulse 93  Temp(Src) 98 F (36.7 C) (Oral)  Resp 22  Ht 5\' 4"  (1.626 m)  Wt 265 lb (120.203 kg)  BMI 45.46 kg/m2  SpO2 99%  LMP 01/24/2013 Physical Exam  Nursing note and vitals reviewed. Constitutional: She is oriented to person, place, and time. She appears well-developed and well-nourished. No distress.  Morbidly obese  HENT:  Head: Normocephalic.  Right Ear: Tympanic membrane normal.  Left Ear: Tympanic membrane normal.  Nose: Mucosal edema and rhinorrhea present. Right sinus exhibits maxillary sinus tenderness. Left sinus exhibits maxillary sinus tenderness.  Mouth/Throat: Uvula is midline and oropharynx is clear and moist.  Eyes: Conjunctivae and EOM are normal.  Neck: Normal range of motion. Neck supple.  Cardiovascular: Normal rate.   Pulmonary/Chest: Effort normal. She has no wheezes. She has no rales.  Abdominal: Soft. There is no tenderness.  Musculoskeletal: Normal range of motion.  Lymphadenopathy:    She has no cervical adenopathy.  Neurological: She is alert and oriented to person, place, and time. No cranial nerve deficit.  Skin: Skin is warm and dry.  Psychiatric: She has a normal mood and affect. Her behavior is normal.    ED Course  Procedures   MDM  48 y.o. female with sinus pain and congestion. Stable for discharge without fever or signs of sepsis. Discussed with the patient clinical findings and plan of care. All questioned fully answered. She will return if any problems arise.   Medication List     TAKE these medications       amoxicillin-clavulanate 875-125 MG per tablet  Commonly known as:  AUGMENTIN  Take 1 tablet by mouth 2 (two) times daily.     HYDROcodone-acetaminophen 5-325 MG per tablet  Commonly known as:  NORCO/VICODIN  Take 1 tablet by mouth every 4 (four) hours as needed.      ASK your doctor about these medications       diphenhydrAMINE 25 mg capsule  Commonly known as:  BENADRYL  Take 50 mg by mouth every 8 (eight) hours as needed for allergies or sleep.     multivitamin with minerals Tabs tablet  Take 1 tablet by mouth daily.           Ashley [place], [place] 04/16/14 [phone]

## 2014-04-17 NOTE — ED Provider Notes (Signed)
Medical screening examination/treatment/procedure(s) were performed by non-physician practitioner and as supervising physician I was immediately available for consultation/collaboration.   EKG Interpretation None        Maudry Diego, MD 04/17/14 [phone]

## 2014-05-29 ENCOUNTER — Encounter (HOSPITAL_COMMUNITY): Payer: Self-pay | Admitting: Emergency Medicine

## 2014-08-25 ENCOUNTER — Inpatient Hospital Stay (HOSPITAL_COMMUNITY)
Admission: EM | Admit: 2014-08-25 | Discharge: 2014-08-28 | DRG: 638 | Disposition: A | Discharge status: Home / Self Care | Payer: Medicaid Other | Attending: Internal Medicine | Admitting: Internal Medicine

## 2014-08-25 ENCOUNTER — Encounter (HOSPITAL_COMMUNITY): Payer: Self-pay | Admitting: Emergency Medicine

## 2014-08-25 DIAGNOSIS — F1210 Cannabis abuse, uncomplicated: Secondary | ICD-10-CM | POA: Diagnosis present

## 2014-08-25 DIAGNOSIS — R739 Hyperglycemia, unspecified: Secondary | ICD-10-CM | POA: Insufficient documentation

## 2014-08-25 DIAGNOSIS — N61 Inflammatory disorders of breast: Secondary | ICD-10-CM | POA: Diagnosis present

## 2014-08-25 DIAGNOSIS — F250 Schizoaffective disorder, bipolar type: Secondary | ICD-10-CM | POA: Diagnosis present

## 2014-08-25 DIAGNOSIS — E1165 Type 2 diabetes mellitus with hyperglycemia: Secondary | ICD-10-CM | POA: Diagnosis present

## 2014-08-25 DIAGNOSIS — F29 Unspecified psychosis not due to a substance or known physiological condition: Secondary | ICD-10-CM | POA: Diagnosis present

## 2014-08-25 DIAGNOSIS — Z8249 Family history of ischemic heart disease and other diseases of the circulatory system: Secondary | ICD-10-CM

## 2014-08-25 DIAGNOSIS — M25511 Pain in right shoulder: Secondary | ICD-10-CM | POA: Diagnosis present

## 2014-08-25 DIAGNOSIS — E119 Type 2 diabetes mellitus without complications: Secondary | ICD-10-CM | POA: Insufficient documentation

## 2014-08-25 DIAGNOSIS — E1100 Type 2 diabetes mellitus with hyperosmolarity without nonketotic hyperglycemic-hyperosmolar coma (NKHHC): Secondary | ICD-10-CM | POA: Diagnosis present

## 2014-08-25 DIAGNOSIS — M549 Dorsalgia, unspecified: Secondary | ICD-10-CM | POA: Diagnosis present

## 2014-08-25 DIAGNOSIS — M797 Fibromyalgia: Secondary | ICD-10-CM | POA: Diagnosis present

## 2014-08-25 DIAGNOSIS — G8929 Other chronic pain: Secondary | ICD-10-CM | POA: Diagnosis present

## 2014-08-25 DIAGNOSIS — Z6841 Body Mass Index (BMI) 40.0 and over, adult: Secondary | ICD-10-CM | POA: Diagnosis not present

## 2014-08-25 DIAGNOSIS — E1101 Type 2 diabetes mellitus with hyperosmolarity with coma: Secondary | ICD-10-CM

## 2014-08-25 DIAGNOSIS — F410 Panic disorder [episodic paroxysmal anxiety] without agoraphobia: Secondary | ICD-10-CM | POA: Diagnosis present

## 2014-08-25 DIAGNOSIS — E871 Hypo-osmolality and hyponatremia: Secondary | ICD-10-CM | POA: Diagnosis present

## 2014-08-25 DIAGNOSIS — F17210 Nicotine dependence, cigarettes, uncomplicated: Secondary | ICD-10-CM | POA: Diagnosis present

## 2014-08-25 HISTORY — DX: Unspecified psychosis not due to a substance or known physiological condition: F29

## 2014-08-25 LAB — RAPID URINE DRUG SCREEN, HOSP PERFORMED
Amphetamines: NOT DETECTED
BARBITURATES: NOT DETECTED
BENZODIAZEPINES: NOT DETECTED
Cocaine: NOT DETECTED
Opiates: NOT DETECTED
TETRAHYDROCANNABINOL: POSITIVE — AB

## 2014-08-25 LAB — BASIC METABOLIC PANEL
ANION GAP: 5 (ref 5–15)
BUN: 5 mg/dL — AB (ref 6–23)
CO2: 25 mmol/L (ref 19–32)
CREATININE: 0.59 mg/dL (ref 0.50–1.10)
Calcium: 8.1 mg/dL — ABNORMAL LOW (ref 8.4–10.5)
Chloride: 102 mmol/L (ref 96–112)
Glucose, Bld: 146 mg/dL — ABNORMAL HIGH (ref 70–99)
Potassium: 2.8 mmol/L — ABNORMAL LOW (ref 3.5–5.1)
Sodium: 132 mmol/L — ABNORMAL LOW (ref 135–145)

## 2014-08-25 LAB — CBC WITH DIFFERENTIAL/PLATELET
BASOS PCT: 0 % (ref 0–1)
Basophils Absolute: 0 10*3/uL (ref 0.0–0.1)
Eosinophils Absolute: 0.1 10*3/uL (ref 0.0–0.7)
Eosinophils Relative: 1 % (ref 0–5)
HCT: 41.1 % (ref 36.0–46.0)
Hemoglobin: 14.6 g/dL (ref 12.0–15.0)
Lymphocytes Relative: 25 % (ref 12–46)
Lymphs Abs: 1.6 10*3/uL (ref 0.7–4.0)
MCH: 33.1 pg (ref 26.0–34.0)
MCHC: 35.5 g/dL (ref 30.0–36.0)
MCV: 93.2 fL (ref 78.0–100.0)
MONOS PCT: 6 % (ref 3–12)
Monocytes Absolute: 0.4 10*3/uL (ref 0.1–1.0)
Neutro Abs: 4.3 10*3/uL (ref 1.7–7.7)
Neutrophils Relative %: 68 % (ref 43–77)
Platelets: 125 10*3/uL — ABNORMAL LOW (ref 150–400)
RBC: 4.41 MIL/uL (ref 3.87–5.11)
RDW: 12.6 % (ref 11.5–15.5)
WBC: 6.4 10*3/uL (ref 4.0–10.5)

## 2014-08-25 LAB — COMPREHENSIVE METABOLIC PANEL
ALBUMIN: 3.7 g/dL (ref 3.5–5.2)
ALK PHOS: 140 U/L — AB (ref 39–117)
ALT: 42 U/L — ABNORMAL HIGH (ref 0–35)
ANION GAP: 9 (ref 5–15)
AST: 46 U/L — ABNORMAL HIGH (ref 0–37)
BUN: 7 mg/dL (ref 6–23)
CALCIUM: 9 mg/dL (ref 8.4–10.5)
CHLORIDE: 93 mmol/L — AB (ref 96–112)
CO2: 23 mmol/L (ref 19–32)
Creatinine, Ser: 0.8 mg/dL (ref 0.50–1.10)
GFR calc Af Amer: >90 mL/min (ref >90)
GFR calc non Af Amer: 86 mL/min — ABNORMAL LOW (ref >90)
GLUCOSE: 721 mg/dL — AB (ref 70–99)
Potassium: 3.6 mmol/L (ref 3.5–5.1)
SODIUM: 125 mmol/L — AB (ref 135–145)
Total Bilirubin: 1.1 mg/dL (ref 0.3–1.2)
Total Protein: 7.9 g/dL (ref 6.0–8.3)

## 2014-08-25 LAB — URINE MICROSCOPIC-ADD ON

## 2014-08-25 LAB — GLUCOSE, CAPILLARY
GLUCOSE-CAPILLARY: 353 mg/dL — AB (ref 70–99)
GLUCOSE-CAPILLARY: 310 mg/dL — AB (ref 70–99)
Glucose-Capillary: 112 mg/dL — ABNORMAL HIGH (ref 70–99)
Glucose-Capillary: 142 mg/dL — ABNORMAL HIGH (ref 70–99)
Glucose-Capillary: 188 mg/dL — ABNORMAL HIGH (ref 70–99)

## 2014-08-25 LAB — URINALYSIS, ROUTINE W REFLEX MICROSCOPIC
BILIRUBIN URINE: NEGATIVE
Glucose, UA: >1000 mg/dL — AB
Hgb urine dipstick: NEGATIVE
Ketones, ur: NEGATIVE mg/dL
Leukocytes, UA: NEGATIVE
NITRITE: NEGATIVE
PH: 6 (ref 5.0–8.0)
PROTEIN: NEGATIVE mg/dL
Urobilinogen, UA: 0.2 mg/dL (ref 0.0–1.0)

## 2014-08-25 LAB — CBG MONITORING, ED
Glucose-Capillary: >600 mg/dL (ref 70–99)
Glucose-Capillary: 421 mg/dL — ABNORMAL HIGH (ref 70–99)

## 2014-08-25 LAB — MRSA PCR SCREENING: MRSA by PCR: POSITIVE — AB

## 2014-08-25 LAB — PREGNANCY, URINE: Preg Test, Ur: NEGATIVE

## 2014-08-25 MED ORDER — SODIUM CHLORIDE 0.9 % IV SOLN
INTRAVENOUS | Status: DC
  Administered 2014-08-25: 17:00:00 via INTRAVENOUS

## 2014-08-25 MED ORDER — SODIUM CHLORIDE 0.9 % IV SOLN
INTRAVENOUS | Status: DC
  Administered 2014-08-25: 5.4 [IU]/h via INTRAVENOUS
  Filled 2014-08-25: qty 2.5

## 2014-08-25 MED ORDER — DEXTROSE-NACL 5-0.45 % IV SOLN
INTRAVENOUS | Status: DC
  Administered 2014-08-25: 22:00:00 via INTRAVENOUS

## 2014-08-25 MED ORDER — SODIUM CHLORIDE 0.9 % IV SOLN: INTRAVENOUS | Status: DC

## 2014-08-25 MED ORDER — DEXTROSE-NACL 5-0.45 % IV SOLN: INTRAVENOUS | Status: DC

## 2014-08-25 MED ORDER — HEPARIN SODIUM (PORCINE) 5000 UNIT/ML IJ SOLN
5000.0000 [IU] | Freq: Three times a day (TID) | INTRAMUSCULAR | Status: DC
Start: 2014-08-25 — End: 2014-08-28
  Administered 2014-08-25 – 2014-08-28 (×8): 5000 [IU] via SUBCUTANEOUS
  Filled 2014-08-25 (×8): qty 1

## 2014-08-25 MED ORDER — SODIUM CHLORIDE 0.9 % IV SOLN
INTRAVENOUS | Status: DC
  Administered 2014-08-25: 19:00:00 via INTRAVENOUS

## 2014-08-25 MED ORDER — SODIUM CHLORIDE 0.9 % IV BOLUS (SEPSIS)
2000.0000 mL | Freq: Once | INTRAVENOUS | Status: AC
  Administered 2014-08-25: 2000 mL via INTRAVENOUS

## 2014-08-25 MED ORDER — DEXTROSE 50 % IV SOLN: 25.0000 mL | INTRAVENOUS | Status: DC | PRN

## 2014-08-25 MED ORDER — INFLUENZA VAC SPLIT QUAD 0.5 ML IM SUSY
0.5000 mL | PREFILLED_SYRINGE | INTRAMUSCULAR | Status: AC
  Administered 2014-08-26: 0.5 mL via INTRAMUSCULAR
  Filled 2014-08-25: qty 0.5

## 2014-08-25 MED ORDER — SODIUM CHLORIDE 0.9 % IV SOLN: INTRAVENOUS | Status: AC

## 2014-08-25 MED ORDER — INSULIN ASPART 100 UNIT/ML ~~LOC~~ SOLN: 0.0000 [IU] | Freq: Three times a day (TID) | SUBCUTANEOUS | Status: DC

## 2014-08-25 MED ORDER — POTASSIUM CHLORIDE 10 MEQ/100ML IV SOLN: 10.0000 meq | INTRAVENOUS | Status: DC

## 2014-08-25 NOTE — ED Notes (Signed)
Report given to Misty RN 

## 2014-08-25 NOTE — ED Provider Notes (Signed)
CSN: [id number]     Arrival date & time 08/25/14  1511 History   First MD Initiated Contact with Patient 08/25/14 1604     Chief Complaint  Patient presents with  . Panic Attack      HPI Pt was seen at 1605. Per pt, c/o gradual onset and persistence of constant feeling of "anxiety" that began when she woke up this morning. Pt states she "feels like I've had a few panic attacks" and "feel like I'm about to have a seizure." Pt denies hx of seizures. Pt also endorses nausea, "dry mouth" and "feeling thirsty" for an unknown period of time. Pt states she believes there are "poisonous gas particles floating around her house that are poisoning her" and that her boyfriend is "feeding me sugar pills to make me sick." Pt states she "doesn't have diabetes anymore" and stopped taking her insulin and PO DM meds "at least a year ago because my sugars were fine." Pt cannot further describe was "fine" was. Denies SI, no SA, no HI, no abd pain, no vomiting/diarrhea, no CP/SOB.    Past Medical History  Diagnosis Date  . Bipolar 1 disorder     pt denies, says was misdiagnosed  . Diabetes mellitus   . Fibromyalgia   . Anxiety   . Panic attack   . Chronic back pain   . Chronic chest wall pain   . Chronic right shoulder pain   . Schizoaffective disorder   . Psychotic disorder    Past Surgical History  Procedure Laterality Date  . Mandible surgery    . Cholecystectomy     Family History  Problem Relation Age of Onset  . Heart failure Mother   . Heart failure Other    History  Substance Use Topics  . Smoking status: Current Every Day Smoker -- 1.00 packs/day for 30 years    Types: Cigarettes  . Smokeless tobacco: Never Used  . Alcohol Use: No   OB History    Gravida Para Term Preterm AB TAB SAB Ectopic Multiple Living   4    2  2         Review of Systems ROS: Statement: All systems negative except as marked or noted in the HPI; Constitutional: Negative for fever and chills. ; ; Eyes:  Negative for eye pain, redness and discharge. ; ; ENMT: Negative for ear pain, hoarseness, nasal congestion, sinus pressure and sore throat. ; ; Cardiovascular: Negative for chest pain, palpitations, diaphoresis, dyspnea and peripheral edema. ; ; Respiratory: Negative for cough, wheezing and stridor. ; ; Gastrointestinal: +nausea, increased thirst, dry mouth. Negative for vomiting, diarrhea, abdominal pain, blood in stool, hematemesis, jaundice and rectal bleeding. . ; ; Genitourinary: Negative for dysuria, flank pain and hematuria. ; ; Musculoskeletal: Negative for back pain and neck pain. Negative for swelling and trauma.; ; Skin: Negative for pruritus, rash, abrasions, blisters, bruising and skin lesion.; ; Neuro: Negative for headache, lightheadedness and neck stiffness. Negative for weakness, altered level of consciousness , altered mental status, extremity weakness, paresthesias, involuntary movement, seizure and syncope. ; Psych:  +anxiety. No SI, no SA, no HI.      Allergies  Cocaine; Procaine hcl; Aripiprazole; Cogentin; Geodon; and Haldol  Home Medications   Prior to Admission medications   Medication Sig Start Date End Date Taking? Authorizing Provider  Multiple Vitamin (MULTIVITAMIN WITH MINERALS) TABS tablet Take 1 tablet by mouth daily.   Yes Historical Provider, MD  pseudoephedrine-acetaminophen (TYLENOL SINUS) 30-500 MG TABS Take 2  tablets by mouth every 4 (four) hours as needed (for allergies).   Yes Historical Provider, MD  amoxicillin-clavulanate (AUGMENTIN) 875-125 MG per tablet Take 1 tablet by mouth 2 (two) times daily. Patient not taking: Reported on 08/25/2014 04/15/14   Ashley Murrain, NP  HYDROcodone-acetaminophen (NORCO/VICODIN) 5-325 MG per tablet Take 1 tablet by mouth every 4 (four) hours as needed. Patient not taking: Reported on 08/25/2014 04/15/14   Ashley Murrain, NP   BP 132/93 mmHg  Pulse 93  Temp(Src) 98.1 F (36.7 C) (Oral)  Resp 18  Ht 5\' 4"  (1.626 m)  Wt 270  lb (122.471 kg)  BMI 46.32 kg/m2  SpO2 96%  LMP 01/24/2013 Physical Exam  1610: Physical examination:  Nursing notes reviewed; Vital signs and O2 SAT reviewed;  Constitutional: Well developed, Well nourished, Well hydrated, In no acute distress; Head:  Normocephalic, atraumatic; Eyes: EOMI, PERRL, No scleral icterus; ENMT: Mouth and pharynx normal, Mucous membranes moist; Neck: Supple, Full range of motion, No lymphadenopathy; Cardiovascular: Regular rate and rhythm, No murmur, rub, or gallop; Respiratory: Breath sounds clear & equal bilaterally, No rales, rhonchi, wheezes.  Speaking full sentences with ease, Normal respiratory effort/excursion; Chest: Nontender, Movement normal; Abdomen: Soft, Nontender, Nondistended, Normal bowel sounds; Genitourinary: No CVA tenderness; Extremities: Pulses normal, No tenderness, No edema, No calf edema or asymmetry.; Neuro: AA&Ox3, Major CN grossly intact.  Speech clear. No gross focal motor or sensory deficits in extremities.; Skin: Color normal, Warm, Dry.; Psych:  Delusional.     ED Course  Procedures     EKG Interpretation None      MDM  MDM Reviewed: previous chart, nursing note and vitals Reviewed previous: labs Interpretation: labs Total time providing critical care: 30-74 minutes. This excludes time spent performing separately reportable procedures and services. Consults: admitting MD   CRITICAL CARE Performed by: Alfonzo Feller Total critical care time: 35 Critical care time was exclusive of separately billable procedures and treating other patients. Critical care was necessary to treat or prevent imminent or life-threatening deterioration. Critical care was time spent personally by me on the following activities: development of treatment plan with patient and/or surrogate as well as nursing, discussions with consultants, evaluation of patient's response to treatment, examination of patient, obtaining history from patient or  surrogate, ordering and performing treatments and interventions, ordering and review of laboratory studies, ordering and review of radiographic studies, pulse oximetry and re-evaluation of patient's condition.   Results for orders placed or performed during the hospital encounter of 08/25/14  Urinalysis, Routine w reflex microscopic  Result Value Ref Range   Color, Urine YELLOW YELLOW   APPearance CLEAR CLEAR   Specific Gravity, Urine <1.005 (L) 1.005 - 1.030   pH 6.0 5.0 - 8.0   Glucose, UA >1000 (A) NEGATIVE mg/dL   Hgb urine dipstick NEGATIVE NEGATIVE   Bilirubin Urine NEGATIVE NEGATIVE   Ketones, ur NEGATIVE NEGATIVE mg/dL   Protein, ur NEGATIVE NEGATIVE mg/dL   Urobilinogen, UA 0.2 0.0 - 1.0 mg/dL   Nitrite NEGATIVE NEGATIVE   Leukocytes, UA NEGATIVE NEGATIVE  Drug screen panel, emergency  Result Value Ref Range   Opiates NONE DETECTED NONE DETECTED   Cocaine NONE DETECTED NONE DETECTED   Benzodiazepines NONE DETECTED NONE DETECTED   Amphetamines NONE DETECTED NONE DETECTED   Tetrahydrocannabinol POSITIVE (A) NONE DETECTED   Barbiturates NONE DETECTED NONE DETECTED  Comprehensive metabolic panel  Result Value Ref Range   Sodium 125 (L) 135 - 145 mmol/L   Potassium 3.6 3.5 -  5.1 mmol/L   Chloride 93 (L) 96 - 112 mmol/L   CO2 23 19 - 32 mmol/L   Glucose, Bld 721 (HH) 70 - 99 mg/dL   BUN 7 6 - 23 mg/dL   Creatinine, Ser 0.80 0.50 - 1.10 mg/dL   Calcium 9.0 8.4 - 10.5 mg/dL   Total Protein 7.9 6.0 - 8.3 g/dL   Albumin 3.7 3.5 - 5.2 g/dL   AST 46 (H) 0 - 37 U/L   ALT 42 (H) 0 - 35 U/L   Alkaline Phosphatase 140 (H) 39 - 117 U/L   Total Bilirubin 1.1 0.3 - 1.2 mg/dL   GFR calc non Af Amer 86 (L) >90 mL/min   GFR calc Af Amer >90 >90 mL/min   Anion gap 9 5 - 15  CBC with Differential  Result Value Ref Range   WBC 6.4 4.0 - 10.5 K/uL   RBC 4.41 3.87 - 5.11 MIL/uL   Hemoglobin 14.6 12.0 - 15.0 g/dL   HCT 41.1 36.0 - 46.0 %   MCV 93.2 78.0 - 100.0 fL   MCH 33.1 26.0 -  34.0 pg   MCHC 35.5 30.0 - 36.0 g/dL   RDW 12.6 11.5 - 15.5 %   Platelets 125 (L) 150 - 400 K/uL   Neutrophils Relative % 68 43 - 77 %   Neutro Abs 4.3 1.7 - 7.7 K/uL   Lymphocytes Relative 25 12 - 46 %   Lymphs Abs 1.6 0.7 - 4.0 K/uL   Monocytes Relative 6 3 - 12 %   Monocytes Absolute 0.4 0.1 - 1.0 K/uL   Eosinophils Relative 1 0 - 5 %   Eosinophils Absolute 0.1 0.0 - 0.7 K/uL   Basophils Relative 0 0 - 1 %   Basophils Absolute 0.0 0.0 - 0.1 K/uL  Urine microscopic-add on  Result Value Ref Range   Squamous Epithelial / LPF RARE RARE   WBC, UA 0-2 <3 WBC/hpf   Bacteria, UA RARE RARE   Urine-Other RARE YEAST   POC CBG, ED  Result Value Ref Range   Glucose-Capillary >600 (HH) 70 - 99 mg/dL   Comment 1 Confirm Test in Lab     1650:  Pt delusional; IVC paperwork completed. Will need medical admit for elevated glucose/glucose control. IVF NS x2L started, as well as IV insulin gtt. Dx and testing d/w pt.  Questions answered.  Verb understanding, agreeable to admit.  T/C to [facility]. Anastasio Champion, case discussed, including:  HPI, pertinent PM/SHx, VS/PE, dx testing, ED course and treatment:  Agreeable to admit, requests to write temporary orders, obtain stepdown bed to team APAdmits.     Francine Graven, DO 08/28/14 1341

## 2014-08-25 NOTE — ED Notes (Signed)
Critical value given to Dr. Thurnell Garbe, Glu 721.

## 2014-08-25 NOTE — ED Notes (Signed)
PT was stated there are poisonous gas particles floating around her house poisoning her and that her boyfriend has been feeding her glucose tablets to make her sick. MD made aware.

## 2014-08-25 NOTE — ED Notes (Addendum)
PT stated she woke up this morning anxious and felt like she was going to have a seizure and states she had a few panic attacks. PT also states she has been nauseated today but ate breakfast and lunch and drank a litter of water with no vomiting or diarrhea. Pt stated she thinks her boyfriend gave her a sugar pill this evening. PT stated she also smoke majuana this morning to try to help with nausea.

## 2014-08-25 NOTE — H&P (Signed)
[facility] History and Physical  [name]:[id number] DOB: 10-05-1965 DOA: 08/25/2014  Referring physician: ER PCP: No PCP Per Patient   Chief Complaint: Polydipsia, polyuria, hyperglycemia.  HPI: [name] is a 49 y.o. female  This 49 year old lady came to the emergency room because of feeling anxious but also says that her sugar has been elevated. She found this out when she came to the emergency room. She has a long-standing psychiatric history of schizoaffective disorder and I don't believe she has been taking any regular medication for this. She thinks that her boyfriend, who she describes as her roommate, is feeding her sugar pills that are elevating her sugar. Evaluation in the emergency room showed that the patient has a blood glucose of 721 with a sodium of 125. She has not been taking any medications for her diabetes. She denies any chest pain, palpitations, dyspnea, limb weakness, altered mental status acutely. She is now being admitted for treatment of her hyperosmolar state.   Review of Systems:  Apart from symptoms above, all systems are negative.  Past Medical History  Diagnosis Date  . Bipolar 1 disorder     pt denies, says was misdiagnosed  . Diabetes mellitus   . Fibromyalgia   . Anxiety   . Panic attack   . Chronic back pain   . Chronic chest wall pain   . Chronic right shoulder pain   . Schizoaffective disorder   . Psychotic disorder    Past Surgical History  Procedure Laterality Date  . Mandible surgery    . Cholecystectomy     Social History:  reports that she has been smoking Cigarettes.  She has a 30 pack-year smoking history. She has never used smokeless tobacco. She reports that she uses illicit drugs (Marijuana). She reports that she does not drink alcohol.  Allergies  Allergen Reactions  . Cocaine Anaphylaxis and Other (See Comments)    Eyes Swell Shut  . Procaine Hcl Anaphylaxis    Eyes swell shut  . Aripiprazole  Other (See Comments)    Gives me "Knots in legs"  . Cogentin [Benztropine Mesylate] Other (See Comments)    Knots in legs  . Geodon [Ziprasidone Hcl] Other (See Comments)    Knots in Legs  . Haldol [Haloperidol Decanoate] Nausea And Vomiting    Leg stiffness and Knots in legs    Family History  Problem Relation Age of Onset  . Heart failure Mother   . Heart failure Other       Prior to Admission medications   Medication Sig Start Date End Date Taking? Authorizing Provider  Multiple Vitamin (MULTIVITAMIN WITH MINERALS) TABS tablet Take 1 tablet by mouth daily.   Yes Historical Provider, MD  pseudoephedrine-acetaminophen (TYLENOL SINUS) 30-500 MG TABS Take 2 tablets by mouth every 4 (four) hours as needed (for allergies).   Yes Historical Provider, MD  amoxicillin-clavulanate (AUGMENTIN) 875-125 MG per tablet Take 1 tablet by mouth 2 (two) times daily. Patient not taking: Reported on 08/25/2014 04/15/14   Ashley Murrain, NP  HYDROcodone-acetaminophen (NORCO/VICODIN) 5-325 MG per tablet Take 1 tablet by mouth every 4 (four) hours as needed. Patient not taking: Reported on 08/25/2014 04/15/14   Ashley Murrain, NP   Physical Exam: Filed Vitals:   08/25/14 1507 08/25/14 1530 08/25/14 1632 08/25/14 1730  BP: 129/94 103/85 132/93 116/69  Pulse: 106 105 93 68  Temp: 98.1 F (36.7 C)     TempSrc: Oral     Resp: 18  Height: 5\' 4"  (1.626 m)     Weight: 122.471 kg (270 lb)     SpO2: 97% 92% 96% 95%    Wt Readings from Last 3 Encounters:  08/25/14 122.471 kg (270 lb)  04/15/14 120.203 kg (265 lb)  03/13/14 119.75 kg (264 lb)    General:  Appears calm and comfortable. She is alert and oriented. She is not clinically dehydrated. There is no small respiration. Eyes: PERRL, normal lids, irises & conjunctiva ENT: grossly normal hearing, lips & tongue Neck: no LAD, masses or thyromegaly Cardiovascular: RRR, no m/r/g. No LE edema. Telemetry: SR, no arrhythmias  Respiratory: CTA bilaterally,  no w/r/r. Normal respiratory effort. Abdomen: soft, ntnd Skin: no rash or induration seen on limited exam Musculoskeletal: grossly normal tone BUE/BLE Psychiatric: She has disordered thinking, consistent with her schizoaffective disorder. She appears to have delusions and paranoia. Neurologic: grossly non-focal.          Labs on Admission:  Basic Metabolic Panel:  Recent Labs Lab 08/25/14 1523  NA 125*  K 3.6  CL 93*  CO2 23  GLUCOSE 721*  BUN 7  CREATININE 0.80  CALCIUM 9.0   Liver Function Tests:  Recent Labs Lab 08/25/14 1523  AST 46*  ALT 42*  ALKPHOS 140*  BILITOT 1.1  PROT 7.9  ALBUMIN 3.7   No results for input(s): LIPASE, AMYLASE in the last 168 hours. No results for input(s): AMMONIA in the last 168 hours. CBC:  Recent Labs Lab 08/25/14 1523  WBC 6.4  NEUTROABS 4.3  HGB 14.6  HCT 41.1  MCV 93.2  PLT 125*   Cardiac Enzymes: No results for input(s): CKTOTAL, CKMB, CKMBINDEX, TROPONINI in the last 168 hours.  BNP (last 3 results) No results for input(s): PROBNP in the last 8760 hours. CBG:  Recent Labs Lab 08/25/14 1512 08/25/14 1744  GLUCAP >600* 421*    Radiological Exams on Admission: No results found.    Assessment/Plan   1. Hyperosmolar nonketotic state in a patient with type 2 diabetes mellitus. She'll be treated with intravenous insulin and intravenous fluids. DKA order set will be used in this instance. When her diabetes is controlled, she will likely need oral hypoglycemic agents that hopefully she will take as an outpatient and follow-up with a primary care physician for this. 2. Schizoaffective disorder. This will need to be followed up as a outpatient. 3. Cannabis abuse.  Further recommendations will depend on patient's hospital progress.   Code Status: Full code.   DVT Prophylaxis: Heparin.  Family Communication: I discussed the plan with the patient at the bedside.  Disposition Plan: Home when medically stable.    Time spent: 60 minutes.  Doree Albee [facility] Pager [phone].

## 2014-08-26 DIAGNOSIS — L03313 Cellulitis of chest wall: Secondary | ICD-10-CM

## 2014-08-26 DIAGNOSIS — E118 Type 2 diabetes mellitus with unspecified complications: Secondary | ICD-10-CM

## 2014-08-26 LAB — BASIC METABOLIC PANEL
ANION GAP: 4 — AB (ref 5–15)
Anion gap: 5 (ref 5–15)
BUN: 4 mg/dL — ABNORMAL LOW (ref 6–23)
BUN: <5 mg/dL — ABNORMAL LOW (ref 6–23)
CHLORIDE: 105 mmol/L (ref 96–112)
CO2: 25 mmol/L (ref 19–32)
CO2: 24 mmol/L (ref 19–32)
Calcium: 8.2 mg/dL — ABNORMAL LOW (ref 8.4–10.5)
Calcium: 7.9 mg/dL — ABNORMAL LOW (ref 8.4–10.5)
Chloride: 103 mmol/L (ref 96–112)
Creatinine, Ser: 0.59 mg/dL (ref 0.50–1.10)
Creatinine, Ser: 0.64 mg/dL (ref 0.50–1.10)
GFR calc Af Amer: >90 mL/min (ref >90)
GFR calc Af Amer: >90 mL/min (ref >90)
GFR calc non Af Amer: >90 mL/min (ref >90)
Glucose, Bld: 150 mg/dL — ABNORMAL HIGH (ref 70–99)
Glucose, Bld: 215 mg/dL — ABNORMAL HIGH (ref 70–99)
Potassium: 2.8 mmol/L — ABNORMAL LOW (ref 3.5–5.1)
Potassium: 3.5 mmol/L (ref 3.5–5.1)
SODIUM: 133 mmol/L — AB (ref 135–145)
Sodium: 133 mmol/L — ABNORMAL LOW (ref 135–145)

## 2014-08-26 LAB — GLUCOSE, CAPILLARY
GLUCOSE-CAPILLARY: 189 mg/dL — AB (ref 70–99)
GLUCOSE-CAPILLARY: 229 mg/dL — AB (ref 70–99)
GLUCOSE-CAPILLARY: 191 mg/dL — AB (ref 70–99)
Glucose-Capillary: 122 mg/dL — ABNORMAL HIGH (ref 70–99)
Glucose-Capillary: 157 mg/dL — ABNORMAL HIGH (ref 70–99)
Glucose-Capillary: 313 mg/dL — ABNORMAL HIGH (ref 70–99)
Glucose-Capillary: 199 mg/dL — ABNORMAL HIGH (ref 70–99)

## 2014-08-26 MED ORDER — POTASSIUM CHLORIDE CRYS ER 10 MEQ PO TBCR
40.0000 meq | EXTENDED_RELEASE_TABLET | Freq: Once | ORAL | Status: AC
  Administered 2014-08-26: 40 meq via ORAL
  Filled 2014-08-26: qty 4

## 2014-08-26 MED ORDER — INSULIN ASPART 100 UNIT/ML ~~LOC~~ SOLN
0.0000 [IU] | Freq: Three times a day (TID) | SUBCUTANEOUS | Status: DC
  Administered 2014-08-26: 3 [IU] via SUBCUTANEOUS

## 2014-08-26 MED ORDER — GLIPIZIDE 5 MG PO TABS: 10.0000 mg | ORAL_TABLET | Freq: Two times a day (BID) | ORAL | Status: DC

## 2014-08-26 MED ORDER — INSULIN ASPART 100 UNIT/ML ~~LOC~~ SOLN
0.0000 [IU] | Freq: Three times a day (TID) | SUBCUTANEOUS | Status: DC
  Administered 2014-08-26: 4 [IU] via SUBCUTANEOUS
  Administered 2014-08-26: 15 [IU] via SUBCUTANEOUS
  Administered 2014-08-27: 7 [IU] via SUBCUTANEOUS
  Administered 2014-08-27: 4 [IU] via SUBCUTANEOUS
  Administered 2014-08-27: 7 [IU] via SUBCUTANEOUS
  Administered 2014-08-28 (×2): 4 [IU] via SUBCUTANEOUS

## 2014-08-26 MED ORDER — MUPIROCIN 2 % EX OINT
1.0000 "application " | TOPICAL_OINTMENT | Freq: Two times a day (BID) | CUTANEOUS | Status: DC
  Administered 2014-08-26 – 2014-08-28 (×5): 1 via NASAL
  Filled 2014-08-26 (×3): qty 22

## 2014-08-26 MED ORDER — INSULIN GLARGINE 100 UNIT/ML ~~LOC~~ SOLN
10.0000 [IU] | Freq: Once | SUBCUTANEOUS | Status: AC
  Administered 2014-08-26: 10 [IU] via SUBCUTANEOUS
  Filled 2014-08-26: qty 0.1

## 2014-08-26 MED ORDER — INSULIN ASPART 100 UNIT/ML ~~LOC~~ SOLN: 0.0000 [IU] | Freq: Every day | SUBCUTANEOUS | Status: DC

## 2014-08-26 MED ORDER — ACETAMINOPHEN 325 MG PO TABS
650.0000 mg | ORAL_TABLET | Freq: Four times a day (QID) | ORAL | Status: DC | PRN
  Administered 2014-08-26 – 2014-08-28 (×6): 650 mg via ORAL
  Filled 2014-08-26 (×6): qty 2

## 2014-08-26 MED ORDER — METFORMIN HCL 500 MG PO TABS: 1000.0000 mg | ORAL_TABLET | Freq: Two times a day (BID) | ORAL | Status: DC

## 2014-08-26 MED ORDER — SULFAMETHOXAZOLE-TRIMETHOPRIM 800-160 MG PO TABS
1.0000 | ORAL_TABLET | Freq: Two times a day (BID) | ORAL | Status: DC
  Administered 2014-08-26 – 2014-08-28 (×5): 1 via ORAL
  Filled 2014-08-26 (×5): qty 1

## 2014-08-26 MED ORDER — ONDANSETRON HCL 4 MG/2ML IJ SOLN
4.0000 mg | Freq: Four times a day (QID) | INTRAMUSCULAR | Status: DC | PRN
  Administered 2014-08-26 – 2014-08-28 (×3): 4 mg via INTRAVENOUS
  Filled 2014-08-26 (×3): qty 2

## 2014-08-26 MED ORDER — GLIPIZIDE 5 MG PO TABS
10.0000 mg | ORAL_TABLET | Freq: Two times a day (BID) | ORAL | Status: DC
  Administered 2014-08-26 – 2014-08-28 (×5): 10 mg via ORAL
  Filled 2014-08-26 (×5): qty 2

## 2014-08-26 MED ORDER — METFORMIN HCL 500 MG PO TABS
1000.0000 mg | ORAL_TABLET | Freq: Two times a day (BID) | ORAL | Status: DC
  Administered 2014-08-26 – 2014-08-28 (×5): 1000 mg via ORAL
  Filled 2014-08-26 (×5): qty 2

## 2014-08-26 MED ORDER — POTASSIUM CHLORIDE IN NACL 40-0.9 MEQ/L-% IV SOLN
Freq: Once | INTRAVENOUS | Status: AC
  Administered 2014-08-26: 75 mL/h via INTRAVENOUS

## 2014-08-26 MED ORDER — HYDROCODONE-ACETAMINOPHEN 5-325 MG PO TABS: 1.0000 | ORAL_TABLET | Freq: Four times a day (QID) | ORAL | Status: DC | PRN

## 2014-08-26 MED ORDER — CHLORHEXIDINE GLUCONATE CLOTH 2 % EX PADS
6.0000 | MEDICATED_PAD | Freq: Every day | CUTANEOUS | Status: DC
  Administered 2014-08-27: 6 via TOPICAL

## 2014-08-26 NOTE — Progress Notes (Signed)
[facility] PROGRESS NOTE  [name] VFI:[id number] DOB: [birth year] DOA: 08/25/2014 PCP: No PCP Per Patient  Assessment/Plan: 1. Hyperosmolar hyperglycemic nonketotic state. Has improved with IV fluids and insulin infusion per protocol. She has been transitioned from insulin infusion to subcutaneous insulin. She is currently on sliding scale with meals. She reports she has not been on a diabetes medication in several years. Previously she was taking Lantus as well as metformin. This was confirmed with her pharmacy who reports last time diabetes medications were filled was in 2012. At that time, she was prescribed Lantus 10 units daily at bedtime as well as metformin 500 mg daily. We'll start the patient on oral hypoglycemics including metformin 1000 mg twice a day as well as glipizide 10 mg twice a day. Follow-up hemoglobin A1c. Continue to follow blood sugars for the next 24 hours to ensure stability. 2. Diabetes, type II, uncontrolled. See management as above. 3. Hyponatremia. Related to hyperglycemia. Improved. 4. Mild cellulitis under her breast line. Start patient on oral Bactrim. Once her antibiotic course is complete, she will need further breast imaging including mammograms for cancer screening as an outpatient. 5. Schizoaffective disorder, bipolar type. Review of medical history indicates the patient has a history of schizoaffective disorder. [facility]. Her speech at this time is somewhat tangential and often difficult to follow. She does not appear to have any insight into her mental health. In the ER she had stated that "there are poisonous gas particle swelling around her house that are poisoning her" and that her boyfriend is "feeding the sugar pills to make me sick". Patient was involuntarily committed in the emergency room. At this time, she becomes very aggressive and agitated when the subject of mental health is brought  up. She will need a psychiatry evaluation prior to discharge. We'll request psychiatry consultation once she is medically cleared, which I anticipate would be tomorrow.  Code Status: full code Family Communication: discussed with patient Disposition Plan: pending psychiatry evaluation   Consultants:    Procedures:    Antibiotics:  Bactrim 1/30  HPI/Subjective: Patient reports that she been feeling thirsty and noticed increased urination for quite some time now. He reports previously being on diabetic medications including Lantus and oral hypoglycemics several years ago, but "no longer needed it". When the subject of mental health is brought up, patient becomes increasingly agitated, aggressive, and reports that she does not have any mental health issues.  Objective: Filed Vitals:   08/26/14 0900  BP: 132/69  Pulse:   Temp:   Resp: 17    Intake/Output Summary (Last 24 hours) at 08/26/14 1033 Last data filed at 08/26/14 0400  Gross per 24 hour  Intake 1699.89 ml  Output      0 ml  Net 1699.89 ml   Filed Weights   08/25/14 1507 08/26/14 0400  Weight: 122.471 kg (270 lb) 122 kg (268 lb 15.4 oz)    Exam:   General:  NAD   Cardiovascular: S1, S2 RRR  Respiratory: CTA B  Abdomen: soft, nt, nd, bs+  Musculoskeletal: no edema b/l   Skin: mild erythema and small area of induration under right breast line, also has a similar lesion on left breast, no discharge noted from either lesion. Safety Sitter was present in the room during examination  Data Reviewed: Basic Metabolic Panel:  Recent Labs Lab 08/25/14 1523 08/25/14 2150 08/26/14 0200 08/26/14 0719  NA 125* 132* 133* 133*  K 3.6  2.8* 2.8* 3.5  CL 93* 102 103 105  CO2 23 25 25 24   GLUCOSE 721* 146* 150* 215*  BUN 7 5* 4* <5*  CREATININE 0.80 0.59 0.59 0.64  CALCIUM 9.0 8.1* 8.2* 7.9*   Liver Function Tests:  Recent Labs Lab 08/25/14 1523  AST 46*  ALT 42*  ALKPHOS 140*  BILITOT 1.1  PROT 7.9   ALBUMIN 3.7   No results for input(s): LIPASE, AMYLASE in the last 168 hours. No results for input(s): AMMONIA in the last 168 hours. CBC:  Recent Labs Lab 08/25/14 1523  WBC 6.4  NEUTROABS 4.3  HGB 14.6  HCT 41.1  MCV 93.2  PLT 125*   Cardiac Enzymes: No results for input(s): CKTOTAL, CKMB, CKMBINDEX, TROPONINI in the last 168 hours. BNP (last 3 results) No results for input(s): PROBNP in the last 8760 hours. CBG:  Recent Labs Lab 08/25/14 2340 08/26/14 0108 08/26/14 0230 08/26/14 0513 08/26/14 0745  GLUCAP 112* 122* 157* 189* 229*    Recent Results (from the past 240 hour(s))  MRSA PCR Screening     Status: Abnormal   Collection Time: 08/25/14  6:50 PM  Result Value Ref Range Status   MRSA by PCR POSITIVE (A) NEGATIVE Final    Comment:        The GeneXpert MRSA Assay (FDA approved for NASAL specimens only), is one component of a comprehensive MRSA colonization surveillance program. It is not intended to diagnose MRSA infection nor to guide or monitor treatment for MRSA infections. RESULT CALLED TO, READ BACK BY AND VERIFIED WITH: NIELSON,T ON 08/25/14 AT 2230 BY LOY,C      Studies: No results found.  Scheduled Meds: . [START ON 08/27/2014] Chlorhexidine Gluconate Cloth  6 each Topical Q0600  . glipiZIDE  10 mg Oral BID AC  . heparin  5,000 Units Subcutaneous 3 times per day  . Influenza vac split quadrivalent PF  0.5 mL Intramuscular Tomorrow-1000  . insulin aspart  0-20 Units Subcutaneous TID WC  . insulin aspart  0-5 Units Subcutaneous QHS  . metFORMIN  1,000 mg Oral BID WC  . mupirocin ointment  1 application Nasal BID   Continuous Infusions:   Active Problems:   OBESITY, MORBID   Schizoaffective disorder, bipolar type   Cannabis abuse   Psychosis   Hyperosmolar non-ketotic state in patient with type 2 diabetes mellitus    Time spent: 74mins    [name]  [facility] Pager [phone]. If 7PM-7AM, please contact  night-coverage at [email or web address], password [email or web address] 08/26/2014, 10:33 AM  LOS: 1 day

## 2014-08-27 LAB — GLUCOSE, CAPILLARY
GLUCOSE-CAPILLARY: 243 mg/dL — AB (ref 70–99)
Glucose-Capillary: 188 mg/dL — ABNORMAL HIGH (ref 70–99)
Glucose-Capillary: 211 mg/dL — ABNORMAL HIGH (ref 70–99)
Glucose-Capillary: 112 mg/dL — ABNORMAL HIGH (ref 70–99)

## 2014-08-27 LAB — BASIC METABOLIC PANEL WITH GFR
Anion gap: 5 (ref 5–15)
BUN: 6 mg/dL (ref 6–23)
CO2: 24 mmol/L (ref 19–32)
Calcium: 8.3 mg/dL — ABNORMAL LOW (ref 8.4–10.5)
Chloride: 108 mmol/L (ref 96–112)
Creatinine, Ser: 0.59 mg/dL (ref 0.50–1.10)
GFR calc Af Amer: >90 mL/min
GFR calc non Af Amer: >90 mL/min
Glucose, Bld: 247 mg/dL — ABNORMAL HIGH (ref 70–99)
Potassium: 3.5 mmol/L (ref 3.5–5.1)
Sodium: 137 mmol/L (ref 135–145)

## 2014-08-27 MED ORDER — INSULIN GLARGINE 100 UNIT/ML ~~LOC~~ SOLN
10.0000 [IU] | Freq: Every day | SUBCUTANEOUS | Status: DC
Start: 2014-08-27 — End: 2014-08-28
  Administered 2014-08-27 – 2014-08-28 (×2): 10 [IU] via SUBCUTANEOUS
  Filled 2014-08-27 (×3): qty 0.1

## 2014-08-27 NOTE — Progress Notes (Signed)
Pt had a telepsych eval with social worker today but needs one with the NP in order to rescind the IVC order. Spoke with CSW @ [facility] and it will be tomorrow before NP can see pt. Will continue to monitor

## 2014-08-27 NOTE — Plan of Care (Signed)
Problem: Phase II Progression Outcomes Goal: CBGs less than or equal to 200 Outcome: Progressing Tonight blood sugar 198, Goal: Progress activity as tolerated unless otherwise ordered Outcome: Completed/Met Date Met:  08/27/14 Patient out of bed for bath and multiple times to commode, remains steady Goal: Discharge plan established Outcome: Progressing Plan to behavioral health at discharge Goal: Tolerating diet Outcome: Completed/Met Date Met:  08/27/14 Carb modified,
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
## 2014-08-27 NOTE — Progress Notes (Addendum)
CSW attempted to contact the patient's brother Berdine Addison but his voicemail was full and unable to leave a message.    CSW consulted with Theodoro Clock, NP who recommends that the patient follow up with outpatient services for continued mental health treatment.    CSW spoke with Rojelio Brenner, RN about disposition and she agreed to inform the attending physician.     Chesley Noon, MSW, LCSW, LCAS-A Evening Clinical Social Worker [phone]

## 2014-08-27 NOTE — Progress Notes (Signed)
CSW spoke with [facility], RN as she requested the consult and was asked to wait about 15 to 20 for their ability to set up. CSW attempted to call the tele machine and it was not ready yet.      Chesley Noon, MSW, LCSW, LCAS-A Evening Clinical Social Worker [phone]

## 2014-08-28 DIAGNOSIS — F29 Unspecified psychosis not due to a substance or known physiological condition: Secondary | ICD-10-CM | POA: Insufficient documentation

## 2014-08-28 DIAGNOSIS — R739 Hyperglycemia, unspecified: Secondary | ICD-10-CM | POA: Insufficient documentation

## 2014-08-28 DIAGNOSIS — E119 Type 2 diabetes mellitus without complications: Secondary | ICD-10-CM

## 2014-08-28 LAB — HEMOGLOBIN A1C
HEMOGLOBIN A1C: 12.1 % — AB (ref 4.8–5.6)
Mean Plasma Glucose: 301 mg/dL

## 2014-08-28 LAB — GLUCOSE, CAPILLARY
GLUCOSE-CAPILLARY: 163 mg/dL — AB (ref 70–99)
Glucose-Capillary: 189 mg/dL — ABNORMAL HIGH (ref 70–99)

## 2014-08-28 MED ORDER — ONDANSETRON HCL 4 MG PO TABS: 4.0000 mg | ORAL_TABLET | Freq: Three times a day (TID) | ORAL | Status: DC | PRN

## 2014-08-28 MED ORDER — SULFAMETHOXAZOLE-TRIMETHOPRIM 800-160 MG PO TABS: 1.0000 | ORAL_TABLET | Freq: Two times a day (BID) | ORAL | Status: DC

## 2014-08-28 MED ORDER — GLIPIZIDE 10 MG PO TABS: 10.0000 mg | ORAL_TABLET | Freq: Two times a day (BID) | ORAL | Status: DC

## 2014-08-28 MED ORDER — INSULIN GLARGINE 100 UNITS/ML SOLOSTAR PEN: 10.0000 [IU] | PEN_INJECTOR | Freq: Every day | SUBCUTANEOUS | Status: DC

## 2014-08-28 MED ORDER — METFORMIN HCL 1000 MG PO TABS: 1000.0000 mg | ORAL_TABLET | Freq: Two times a day (BID) | ORAL | Status: DC

## 2014-08-28 NOTE — Progress Notes (Signed)
Spoke with patient about diabetes and home regimen for diabetes control. Patient reports that she has a history of diabetes and she use to take oral medication and insulin for diabetes in the past. Inquired about why she was not currently taking any medications for diabetes. Patient reports that she stopped taking all of her medications for diabetes about a year and half ago because her "glucose was always good" when she checked it.  Inquired about knowledge about A1C and patient reports that she has heard of an A1C but she does not know what an A1C is exactly. Discussed A1C results (12.1% on 08/26/14) and explained what an A1C is, basic pathophysiology of DM Type 2, basic home care, importance of checking CBGs and maintaining good CBG control to prevent long-term and short-term complications. Explained how elevated glucose causes damage and can lead to potential complications. Stressed importance of continuing to monitor glucose and taking medications as prescribed with the purpose of keeping glucose controlled. Patient states that she no longer has a glucometer or testing supplies. Asked patient to check glucose 3-4 times per day and to keep a log of glucose values she can take with her to follow up visits. Also asked that she take medications as prescribed by the doctor.  Patient verbalized understanding of information discussed and she states that she has no further questions at this time related to diabetes. Talked with Jacqlyn Larsen, RN to let her know patient had been seen and to let her know that patient will need a prescription for a glucometer and testing supplies at time of discharge. Also discussed with Janett Billow, RN, CM.  Thanks, Barnie Alderman, RN, MSN, CCRN Diabetes Coordinator Inpatient Diabetes Program [phone] (Team Pager) [phone] ([facility] office) [phone] [facility] office)

## 2014-08-28 NOTE — Progress Notes (Signed)
Reviewed discharge instructions and prescriptions with pt, allowed time to ask questions.  Pt to ED lobby to discharge with family and obtain items from security at this time.  Courtney NT accompanied pt.

## 2014-08-28 NOTE — Plan of Care (Signed)
Problem: Food- and Nutrition-Related Knowledge Deficit (NB-1.1) Goal: Nutrition education Formal process to instruct or train a patient/client in a skill or to impart knowledge to help patients/clients voluntarily manage or modify food choices and eating behavior to maintain or improve health. Outcome: Adequate for Discharge  RD consulted for nutrition education regarding diabetes.     Lab Results  Component Value Date    HGBA1C 12.1* 08/26/2014    RD provided "Carbohydrate Counting for People with Diabetes" handout from the Academy of Nutrition and Dietetics. Discussed different food groups and their effects on blood sugar, emphasizing carbohydrate-containing foods. Provided list of carbohydrates and recommended serving sizes of common foods.  Discussed importance of controlled and consistent carbohydrate intake throughout the day. Provided examples of ways to balance meals/snacks and encouraged intake of high-fiber, whole grain complex carbohydrates. Teach back method used.  Encouraged pt to avoid sugar-sweetened beverages and decrease intake of fast foods.  Expect fair compliance.  Body mass index is 46.48 kg/(m^2). Pt meets criteria for obesity class III based on current BMI.  Current diet order is CHO modified/Heart Healthy, patient is consuming approximately 75% of meals at this time. Labs and medications reviewed. No further nutrition interventions warranted at this time. RD contact information provided. If additional nutrition issues arise, please re-consult RD.  Colman Cater MS,RD,CSG,LDN Office: [phone] Pager: [phone]

## 2014-08-28 NOTE — Care Management Utilization Note (Signed)
UR completed 

## 2014-08-28 NOTE — Consult Note (Signed)
[facility] Telepsychiatry Consult   Reason for Consult:  Psychosis Referring Physician:  EDP  [name] is an 49 y.o. female. Total Time spent with patient: 25 minutes  Assessment: AXIS I:  Psychotic Disorder NOS AXIS II:  Deferred AXIS III:   Past Medical History  Diagnosis Date  . Bipolar 1 disorder     pt denies, says was misdiagnosed  . Diabetes mellitus   . Fibromyalgia   . Anxiety   . Panic attack   . Chronic back pain   . Chronic chest wall pain   . Chronic right shoulder pain   . Schizoaffective disorder   . Psychotic disorder    AXIS IV:  other psychosocial or environmental problems AXIS V:  51-60 moderate symptoms  Plan:  No evidence of imminent risk to self or others at present.    Subjective:   [name] is a 49 y.o. female patient admitted with acute psychosis and a blood glucose of 712. Pt was reportedly delusional at the time making random statements about sugar pills, which is not her baseline. She has since improved and was alert/oriented, calm, cooperative and no evidence of delusions or psychosis as of the counselor assessment on 08/27/14, 2 days after her initial evaluation. Pt presents as above to this NP during assessment, and appears to be at baseline. She is denying SI, HI, and AVH, contracting for safety, and demonstrates no evidence of psychosis, feelings of persecution/paranoia, nor responding to internal stimuli. Pt has a history of underlying bipolar disorder, although patient denies ever having this and reports being misdiagnosed. Pt was seen in [facility] in July 2015 for delusions, responding well to treatment inpatient and was stable for discharge. Pt reports that social work has coordinated with her to followup with psychiatry/counseling. [place] [facility] to follow-up as well.   HPI: Per [facility] Attending MD:  Review of medical history indicates the patient has a history of schizoaffective disorder. [facility] in  [facility]/2014. She does not appear to have any insight into her mental health. In the ER she had stated that "there are poisonous gas particle swelling around her house that are poisoning her" and that her boyfriend is "feeding the sugar pills to make me sick". Patient was involuntarily committed in the emergency room. She becomes very aggressive and agitated when the subject of mental health is brought up. She will need a psychiatry evaluation prior to discharge. We'll request psychiatry consultation today. She is medically cleared for discharge.   HPI Elements:   Location:  Psychiatric. Quality:  Stable, improving. Severity:  Moderate. Timing:  Intermittent. Duration:  Transient. Context:  Exacerbation of underlying bipolar/schizoaffective disorder. Review of Systems  Constitutional: Negative.   HENT: Negative.   Eyes: Negative.   Respiratory: Negative.   Cardiovascular: Negative.   Gastrointestinal: Negative.   Genitourinary: Negative.   Musculoskeletal: Negative.   Skin: Negative.   Neurological: Negative.   Endo/Heme/Allergies: Negative.   Psychiatric/Behavioral: Negative for depression. The patient is not nervous/anxious.     Family History  Problem Relation Age of Onset  . Heart failure Mother   . Heart failure Other     Past Psychiatric History: Past Medical History  Diagnosis Date  . Bipolar 1 disorder     pt denies, says was misdiagnosed  . Diabetes mellitus   . Fibromyalgia   . Anxiety   . Panic attack   . Chronic back pain   . Chronic chest wall pain   .  Chronic right shoulder pain   . Schizoaffective disorder   . Psychotic disorder     reports that she has been smoking Cigarettes.  She has a 30 pack-year smoking history. She has never used smokeless tobacco. She reports that she uses illicit drugs (Marijuana). She reports that she does not drink alcohol. Family History  Problem Relation Age of Onset  . Heart failure Mother   . Heart failure Other    Family  History Substance Abuse: Yes, Describe: Family Supports: Yes, List: Living Arrangements: Non-relatives/Friends Can pt return to current living arrangement?: Yes Abuse/Neglect [facility]) Physical Abuse: Denies Verbal Abuse: Denies Sexual Abuse: Denies Allergies:   Allergies  Allergen Reactions  . Cocaine Anaphylaxis and Other (See Comments)    Eyes Swell Shut  . Procaine Hcl Anaphylaxis    Eyes swell shut  . Aripiprazole Other (See Comments)    Gives me "Knots in legs"  . Cogentin [Benztropine Mesylate] Other (See Comments)    Knots in legs  . Geodon [Ziprasidone Hcl] Other (See Comments)    Knots in Legs  . Haldol [Haloperidol Decanoate] Nausea And Vomiting    Leg stiffness and Knots in legs    ACT Assessment Complete:  Yes:    Educational Status    Risk to Self: Risk to self with the past 6 months Suicidal Ideation: No-Not Currently/Within Last 6 Months Suicidal Intent: No-Not Currently/Within Last 6 Months Is patient at risk for suicide?: No Suicidal Plan?: No-Not Currently/Within Last 6 Months Access to Means: No What has been your use of drugs/alcohol within the last 12 months?: marijuana Previous Attempts/Gestures: No Intentional Self Injurious Behavior: None Family Suicide History: No Recent stressful life event(s): Conflict (Comment), Other (Comment) (medical) Persecutory voices/beliefs?: No Depression: No Substance abuse history and/or treatment for substance abuse?: Yes  Risk to Others: Risk to Others within the past 6 months Homicidal Ideation: No-Not Currently/Within Last 6 Months Thoughts of Harm to Others: No-Not Currently Present/Within Last 6 Months Current Homicidal Intent: No-Not Currently/Within Last 6 Months Current Homicidal Plan: No-Not Currently/Within Last 6 Months Access to Homicidal Means: No History of harm to others?: No Assessment of Violence: None Noted Does patient have access to weapons?: No Criminal Charges Pending?: No Does patient have  a court date: No  Abuse: Abuse/Neglect Assessment (Assessment to be complete while patient is alone) Physical Abuse: Denies Verbal Abuse: Denies Sexual Abuse: Denies Exploitation of patient/patient's resources: Denies Self-Neglect: Denies  Prior Inpatient Therapy: Prior Inpatient Therapy Prior Inpatient Therapy: Yes Prior Therapy Dates: 2015 Prior Therapy Facilty/Provider(s): [facility] Reason for Treatment: Psychosis  Prior Outpatient Therapy: Prior Outpatient Therapy Prior Outpatient Therapy: Yes Prior Therapy Dates: 2015 Prior Therapy Facilty/Provider(s): [facility] Reason for Treatment: Schizoaffective  Additional Information: Additional Information 1:1 In Past 12 Months?: No CIRT Risk: No Elopement Risk: No Does patient have medical clearance?: Yes    Objective: Blood pressure 111/50, pulse 65, temperature 98.1 F (36.7 C), temperature source Oral, resp. rate 22, height $RemoveBe'5\' 4"'MkVueGcKr$  (1.626 m), weight 122.9 kg (270 lb 15.1 oz), last menstrual period 01/24/2013, SpO2 95 %.Body mass index is 46.48 kg/(m^2). Results for orders placed or performed during the hospital encounter of 08/25/14 (from the past 72 hour(s))  POC CBG, ED     Status: Abnormal   Collection Time: 08/25/14  3:12 PM  Result Value Ref Range   Glucose-Capillary >600 (HH) 70 - 99 mg/dL   Comment 1 Confirm Test in Lab   Urinalysis, Routine w reflex microscopic     Status: Abnormal  Collection Time: 08/25/14  3:19 PM  Result Value Ref Range   Color, Urine YELLOW YELLOW   APPearance CLEAR CLEAR   Specific Gravity, Urine <1.005 (L) 1.005 - 1.030   pH 6.0 5.0 - 8.0   Glucose, UA >1000 (A) NEGATIVE mg/dL   Hgb urine dipstick NEGATIVE NEGATIVE   Bilirubin Urine NEGATIVE NEGATIVE   Ketones, ur NEGATIVE NEGATIVE mg/dL   Protein, ur NEGATIVE NEGATIVE mg/dL   Urobilinogen, UA 0.2 0.0 - 1.0 mg/dL   Nitrite NEGATIVE NEGATIVE   Leukocytes, UA NEGATIVE NEGATIVE  Drug screen panel, emergency     Status: Abnormal   Collection Time:  08/25/14  3:19 PM  Result Value Ref Range   Opiates NONE DETECTED NONE DETECTED   Cocaine NONE DETECTED NONE DETECTED   Benzodiazepines NONE DETECTED NONE DETECTED   Amphetamines NONE DETECTED NONE DETECTED   Tetrahydrocannabinol POSITIVE (A) NONE DETECTED   Barbiturates NONE DETECTED NONE DETECTED    Comment:        DRUG SCREEN FOR MEDICAL PURPOSES ONLY.  IF CONFIRMATION IS NEEDED FOR ANY PURPOSE, NOTIFY LAB WITHIN 5 DAYS.        LOWEST DETECTABLE LIMITS FOR URINE DRUG SCREEN Drug Class       Cutoff (ng/mL) Amphetamine      1000 Barbiturate      200 Benzodiazepine   657 Tricyclics       846 Opiates          300 Cocaine          300 THC              50   Urine microscopic-add on     Status: None   Collection Time: 08/25/14  3:19 PM  Result Value Ref Range   Squamous Epithelial / LPF RARE RARE   WBC, UA 0-2 <3 WBC/hpf   Bacteria, UA RARE RARE   Urine-Other RARE YEAST   Comprehensive metabolic panel     Status: Abnormal   Collection Time: 08/25/14  3:23 PM  Result Value Ref Range   Sodium 125 (L) 135 - 145 mmol/L   Potassium 3.6 3.5 - 5.1 mmol/L   Chloride 93 (L) 96 - 112 mmol/L   CO2 23 19 - 32 mmol/L   Glucose, Bld 721 (HH) 70 - 99 mg/dL    Comment: CRITICAL RESULT CALLED TO, READ BACK BY AND VERIFIED WITH: C.BORTZ AT 1619 ON 08/25/14 BY S.VANHOORNE    BUN 7 6 - 23 mg/dL   Creatinine, Ser 0.80 0.50 - 1.10 mg/dL   Calcium 9.0 8.4 - 10.5 mg/dL   Total Protein 7.9 6.0 - 8.3 g/dL   Albumin 3.7 3.5 - 5.2 g/dL   AST 46 (H) 0 - 37 U/L   ALT 42 (H) 0 - 35 U/L   Alkaline Phosphatase 140 (H) 39 - 117 U/L   Total Bilirubin 1.1 0.3 - 1.2 mg/dL   GFR calc non Af Amer 86 (L) >90 mL/min   GFR calc Af Amer >90 >90 mL/min    Comment: (NOTE) The eGFR has been calculated using the CKD EPI equation. This calculation has not been validated in all clinical situations. eGFR's persistently <90 mL/min signify possible Chronic Kidney Disease.    Anion gap 9 5 - 15  CBC with  Differential     Status: Abnormal   Collection Time: 08/25/14  3:23 PM  Result Value Ref Range   WBC 6.4 4.0 - 10.5 K/uL   RBC 4.41 3.87 - 5.11 MIL/uL  Hemoglobin 14.6 12.0 - 15.0 g/dL   HCT 41.1 36.0 - 46.0 %   MCV 93.2 78.0 - 100.0 fL   MCH 33.1 26.0 - 34.0 pg   MCHC 35.5 30.0 - 36.0 g/dL   RDW 12.6 11.5 - 15.5 %   Platelets 125 (L) 150 - 400 K/uL   Neutrophils Relative % 68 43 - 77 %   Neutro Abs 4.3 1.7 - 7.7 K/uL   Lymphocytes Relative 25 12 - 46 %   Lymphs Abs 1.6 0.7 - 4.0 K/uL   Monocytes Relative 6 3 - 12 %   Monocytes Absolute 0.4 0.1 - 1.0 K/uL   Eosinophils Relative 1 0 - 5 %   Eosinophils Absolute 0.1 0.0 - 0.7 K/uL   Basophils Relative 0 0 - 1 %   Basophils Absolute 0.0 0.0 - 0.1 K/uL  Pregnancy, urine     Status: None   Collection Time: 08/25/14  4:35 PM  Result Value Ref Range   Preg Test, Ur NEGATIVE NEGATIVE    Comment:        THE SENSITIVITY OF THIS METHODOLOGY IS >20 mIU/mL.   CBG monitoring, ED     Status: Abnormal   Collection Time: 08/25/14  5:44 PM  Result Value Ref Range   Glucose-Capillary 421 (H) 70 - 99 mg/dL  MRSA PCR Screening     Status: Abnormal   Collection Time: 08/25/14  6:50 PM  Result Value Ref Range   MRSA by PCR POSITIVE (A) NEGATIVE    Comment:        The GeneXpert MRSA Assay (FDA approved for NASAL specimens only), is one component of a comprehensive MRSA colonization surveillance program. It is not intended to diagnose MRSA infection nor to guide or monitor treatment for MRSA infections. RESULT CALLED TO, READ BACK BY AND VERIFIED WITH: NIELSON,T ON 08/25/14 AT 2230 BY LOY,C   Glucose, capillary     Status: Abnormal   Collection Time: 08/25/14  6:55 PM  Result Value Ref Range   Glucose-Capillary 353 (H) 70 - 99 mg/dL   Comment 1 Documented in Chart    Comment 2 Notify RN   Glucose, capillary     Status: Abnormal   Collection Time: 08/25/14  8:25 PM  Result Value Ref Range   Glucose-Capillary 310 (H) 70 - 99 mg/dL    Comment 1 Notify RN   Glucose, capillary     Status: Abnormal   Collection Time: 08/25/14  9:27 PM  Result Value Ref Range   Glucose-Capillary 188 (H) 70 - 99 mg/dL   Comment 1 Notify RN   Basic metabolic panel (stat then every 4 hours)     Status: Abnormal   Collection Time: 08/25/14  9:50 PM  Result Value Ref Range   Sodium 132 (L) 135 - 145 mmol/L    Comment: DELTA CHECK NOTED   Potassium 2.8 (L) 3.5 - 5.1 mmol/L    Comment: DELTA CHECK NOTED   Chloride 102 96 - 112 mmol/L    Comment: DELTA CHECK NOTED   CO2 25 19 - 32 mmol/L   Glucose, Bld 146 (H) 70 - 99 mg/dL   BUN 5 (L) 6 - 23 mg/dL   Creatinine, Ser 0.59 0.50 - 1.10 mg/dL   Calcium 8.1 (L) 8.4 - 10.5 mg/dL   GFR calc non Af Amer >90 >90 mL/min   GFR calc Af Amer >90 >90 mL/min    Comment: (NOTE) The eGFR has been calculated using the CKD EPI equation.  This calculation has not been validated in all clinical situations. eGFR's persistently <90 mL/min signify possible Chronic Kidney Disease.    Anion gap 5 5 - 15  Glucose, capillary     Status: Abnormal   Collection Time: 08/25/14 10:21 PM  Result Value Ref Range   Glucose-Capillary 142 (H) 70 - 99 mg/dL   Comment 1 Notify RN   Glucose, capillary     Status: Abnormal   Collection Time: 08/25/14 11:40 PM  Result Value Ref Range   Glucose-Capillary 112 (H) 70 - 99 mg/dL   Comment 1 Notify RN   Glucose, capillary     Status: Abnormal   Collection Time: 08/26/14  1:08 AM  Result Value Ref Range   Glucose-Capillary 122 (H) 70 - 99 mg/dL   Comment 1 Notify RN   Basic metabolic panel (stat then every 4 hours)     Status: Abnormal   Collection Time: 08/26/14  2:00 AM  Result Value Ref Range   Sodium 133 (L) 135 - 145 mmol/L   Potassium 2.8 (L) 3.5 - 5.1 mmol/L   Chloride 103 96 - 112 mmol/L   CO2 25 19 - 32 mmol/L   Glucose, Bld 150 (H) 70 - 99 mg/dL   BUN 4 (L) 6 - 23 mg/dL   Creatinine, Ser 0.59 0.50 - 1.10 mg/dL   Calcium 8.2 (L) 8.4 - 10.5 mg/dL   GFR calc non  Af Amer >90 >90 mL/min   GFR calc Af Amer >90 >90 mL/min    Comment: (NOTE) The eGFR has been calculated using the CKD EPI equation. This calculation has not been validated in all clinical situations. eGFR's persistently <90 mL/min signify possible Chronic Kidney Disease.    Anion gap 5 5 - 15  Glucose, capillary     Status: Abnormal   Collection Time: 08/26/14  2:30 AM  Result Value Ref Range   Glucose-Capillary 157 (H) 70 - 99 mg/dL   Comment 1 Notify RN   Glucose, capillary     Status: Abnormal   Collection Time: 08/26/14  5:13 AM  Result Value Ref Range   Glucose-Capillary 189 (H) 70 - 99 mg/dL   Comment 1 Notify RN   Basic metabolic panel (stat then every 4 hours)     Status: Abnormal   Collection Time: 08/26/14  7:19 AM  Result Value Ref Range   Sodium 133 (L) 135 - 145 mmol/L   Potassium 3.5 3.5 - 5.1 mmol/L    Comment: DELTA CHECK NOTED   Chloride 105 96 - 112 mmol/L   CO2 24 19 - 32 mmol/L   Glucose, Bld 215 (H) 70 - 99 mg/dL   BUN <5 (L) 6 - 23 mg/dL   Creatinine, Ser 0.64 0.50 - 1.10 mg/dL   Calcium 7.9 (L) 8.4 - 10.5 mg/dL   GFR calc non Af Amer >90 >90 mL/min   GFR calc Af Amer >90 >90 mL/min    Comment: (NOTE) The eGFR has been calculated using the CKD EPI equation. This calculation has not been validated in all clinical situations. eGFR's persistently <90 mL/min signify possible Chronic Kidney Disease.    Anion gap 4 (L) 5 - 15  Glucose, capillary     Status: Abnormal   Collection Time: 08/26/14  7:45 AM  Result Value Ref Range   Glucose-Capillary 229 (H) 70 - 99 mg/dL   Comment 1 Documented in Chart    Comment 2 Notify RN   Glucose, capillary     Status:  Abnormal   Collection Time: 08/26/14 11:29 AM  Result Value Ref Range   Glucose-Capillary 313 (H) 70 - 99 mg/dL  Glucose, capillary     Status: Abnormal   Collection Time: 08/26/14  4:39 PM  Result Value Ref Range   Glucose-Capillary 191 (H) 70 - 99 mg/dL   Comment 1 Documented in Chart     Comment 2 Notify RN   Glucose, capillary     Status: Abnormal   Collection Time: 08/26/14  9:25 PM  Result Value Ref Range   Glucose-Capillary 199 (H) 70 - 99 mg/dL   Comment 1 Notify RN   Basic metabolic panel     Status: Abnormal   Collection Time: 08/27/14  6:05 AM  Result Value Ref Range   Sodium 137 135 - 145 mmol/L   Potassium 3.5 3.5 - 5.1 mmol/L   Chloride 108 96 - 112 mmol/L   CO2 24 19 - 32 mmol/L   Glucose, Bld 247 (H) 70 - 99 mg/dL   BUN 6 6 - 23 mg/dL   Creatinine, Ser 0.59 0.50 - 1.10 mg/dL   Calcium 8.3 (L) 8.4 - 10.5 mg/dL   GFR calc non Af Amer >90 >90 mL/min   GFR calc Af Amer >90 >90 mL/min    Comment: (NOTE) The eGFR has been calculated using the CKD EPI equation. This calculation has not been validated in all clinical situations. eGFR's persistently <90 mL/min signify possible Chronic Kidney Disease.    Anion gap 5 5 - 15  Glucose, capillary     Status: Abnormal   Collection Time: 08/27/14  7:59 AM  Result Value Ref Range   Glucose-Capillary 243 (H) 70 - 99 mg/dL  Glucose, capillary     Status: Abnormal   Collection Time: 08/27/14 11:38 AM  Result Value Ref Range   Glucose-Capillary 188 (H) 70 - 99 mg/dL  Glucose, capillary     Status: Abnormal   Collection Time: 08/27/14  4:30 PM  Result Value Ref Range   Glucose-Capillary 211 (H) 70 - 99 mg/dL   Comment 1 Notify RN    Comment 2 Documented in Chart   Glucose, capillary     Status: Abnormal   Collection Time: 08/27/14  9:40 PM  Result Value Ref Range   Glucose-Capillary 112 (H) 70 - 99 mg/dL   Comment 1 Notify RN   Glucose, capillary     Status: Abnormal   Collection Time: 08/28/14  7:31 AM  Result Value Ref Range   Glucose-Capillary 163 (H) 70 - 99 mg/dL   Comment 1 Notify RN    Labs are reviewed, pt had initial glucose of 712, now trending mid 100's consistently; asymptomatic.  Medications reviewed and no changes made.  Current Facility-Administered Medications  Medication Dose Route  Frequency Provider Last Rate Last Dose  . acetaminophen (TYLENOL) tablet 650 mg  650 mg Oral Q6H PRN Kathie Dike, MD   650 mg at 08/28/14 0124  . Chlorhexidine Gluconate Cloth 2 % PADS 6 each  6 each Topical Q0600 Doree Albee, MD   6 each at 08/27/14 0548  . dextrose 50 % solution 25 mL  25 mL Intravenous PRN Francine Graven, DO      . glipiZIDE (GLUCOTROL) tablet 10 mg  10 mg Oral BID AC Kathie Dike, MD   10 mg at 08/28/14 0951  . heparin injection 5,000 Units  5,000 Units Subcutaneous 3 times per day Doree Albee, MD   5,000 Units at 08/28/14 0508  .  HYDROcodone-acetaminophen (NORCO/VICODIN) 5-325 MG per tablet 1 tablet  1 tablet Oral Q6H PRN Kathie Dike, MD      . insulin aspart (novoLOG) injection 0-20 Units  0-20 Units Subcutaneous TID WC Kathie Dike, MD   4 Units at 08/28/14 0950  . insulin aspart (novoLOG) injection 0-5 Units  0-5 Units Subcutaneous QHS Kathie Dike, MD   0 Units at 08/26/14 2137  . insulin glargine (LANTUS) injection 10 Units  10 Units Subcutaneous Q lunch Kathie Dike, MD   10 Units at 08/27/14 1508  . metFORMIN (GLUCOPHAGE) tablet 1,000 mg  1,000 mg Oral BID WC Kathie Dike, MD   1,000 mg at 08/28/14 0951  . mupirocin ointment (BACTROBAN) 2 % 1 application  1 application Nasal BID Doree Albee, MD   1 application at 62/70/35 [phone]  . ondansetron (ZOFRAN) injection 4 mg  4 mg Intravenous Q6H PRN Kathie Dike, MD   4 mg at 08/27/14 1208  . sulfamethoxazole-trimethoprim (BACTRIM DS,SEPTRA DS) 800-160 MG per tablet 1 tablet  1 tablet Oral Q12H Kathie Dike, MD   1 tablet at 08/28/14 0951    Psychiatric Specialty Exam:     Blood pressure 111/50, pulse 65, temperature 98.1 F (36.7 C), temperature source Oral, resp. rate 22, height $RemoveBe'5\' 4"'YcVeUTsNa$  (1.626 m), weight 122.9 kg (270 lb 15.1 oz), last menstrual period 01/24/2013, SpO2 95 %.Body mass index is 46.48 kg/(m^2).  General Appearance: Casual and Fairly Groomed  Engineer, water::  Good  Speech:   Clear and Coherent and Normal Rate  Volume:  Normal  Mood:  Euthymic  Affect:  Appropriate and Congruent  Thought Process:  Coherent and Goal Directed  Orientation:  Full (Time, Place, and Person)  Thought Content:  WDL  Suicidal Thoughts:  No  Homicidal Thoughts:  No  Memory:  Immediate;   Good Recent;   Good Remote;   Fair  Judgement:  Impaired  Insight:  Fair  Psychomotor Activity:  Normal  Concentration:  Fair  Recall:  [employer] of Knowledge:Good  Language: Good  Akathisia:  No  Handed:  Right  AIMS (if indicated):     Assets:  Communication Skills Desire for Improvement Housing  Sleep:      Musculoskeletal: Strength & Muscle Tone: within normal limits Gait & Station: normal Patient leans: N/A  Treatment Plan Summary: -Discharge home with resources given for outpatient psychiatry/counseling (pt reports Social Worker has already assisted with this followup; it is on the EPIC chart also). -[facility]  Benjamine Mola, FNP-BC 08/28/2014 10:10 AM

## 2014-08-28 NOTE — Care Management Note (Addendum)
    Page 1 of 1   08/28/2014     2:53:39 PM CARE MANAGEMENT NOTE 08/28/2014  Patient:  JAMARRIA, REAL   Account Number:  [id number]  Date Initiated:  08/28/2014  Documentation initiated by:  Jolene Provost  Subjective/Objective Assessment:   Pt is from home, lives with husband. Pt has no HH services, DME's or med needs prior to admission. Pt has mental illness that may impact her chronic disease management. Pt plans to discharge home with [facility] RN.     Action/Plan:   Romualdo Bolk of [facility], per pt's choice, has been notified of referral and will obtain pt information from chart. Pt has no further CM needs at this time.   Anticipated DC Date:  08/28/2014   Anticipated DC Plan:  [place]  CM consult      [facility] Choice  HOME HEALTH   Choice offered to / List presented to:  C-1 Patient        [place] arranged  HH-1 RN      [place].   Status of service:  Completed, signed off Medicare Important Message given?   (If response is "NO", the following Medicare IM given date fields will be blank) Date Medicare IM given:   Medicare IM given by:   Date Additional Medicare IM given:   Additional Medicare IM given by:    Discharge Disposition:    Per UR Regulation:  Reviewed for med. necessity/level of care/duration of stay  If discussed at [place] of Stay Meetings, dates discussed:    Comments:  08/28/2014 [place], RN, MSN, CM Pt listed as not having pcp. Pt given list of PCP's in area accepting new pt's. Pt recognized Dr Cindie Laroche on the list and said she has seen him in the past. Follow up appointment will be made with [facility]. Pt given Rx for glucose meter and supplies.  08/28/2014 [place], RN, MSN, CM

## 2014-08-28 NOTE — Discharge Summary (Signed)
Physician Discharge Summary  [name] PIR:[id number] DOB: 05/07/1966 DOA: 08/25/2014  PCP: Maricela Curet, MD  Admit date: 08/25/2014 Discharge date: 08/28/2014  Time spent: 58mnutes  Recommendations for Outpatient Follow-up:  1. Follow-up with primary care physician a 1-2 weeks 2. Follow-up with psychiatrist outpatient.  Discharge Diagnoses:  Active Problems:   OBESITY, MORBID   Schizoaffective disorder, bipolar type   Cannabis abuse   Psychosis   Hyperosmolar non-ketotic state in patient with type 2 diabetes mellitus   Hyperglycemia without ketosis   Psychoses   Type 2 diabetes mellitus not at goal  cellulitis  Discharge Condition: Improved  Diet recommendation: low salt, low carb  Filed Weights   08/25/14 1507 08/26/14 0400 08/27/14 0500  Weight: 122.471 kg (270 lb) 122 kg (268 lb 15.4 oz) 122.9 kg (270 lb 15.1 oz)    History of present illness:  This patient presented to the hospital with polydipsia, polyuria and hyperglycemia. She appeared to have psychosis on admission and felt that there was a "poisonous gas in her house" and that "her boyfriend was trying to poison her with sugar pills". She was noted to have a blood sugar of 721 on admission. The patient reports having elevated sugars in the past and was taking medicines approximately 2 years ago, but has not taken any since then. She was admitted to the hospital for further treatments. She was involuntarily committed in the emergency room  Hospital Course:  Patient was admitted to the stepdown unit and started on an insulin infusion and IV fluids per protocol. Patient's blood sugars improved and she was transitioned to subcutaneous insulin. Hemoglobin A1c was found to be elevated at 12.1. She was started on metformin, glipizide and low-dose Lantus. Her blood sugars have significantly improved since then. She received diabetes education and will follow up with her primary care physician for further  management.  Regarding her schizoaffective disorder, she was seen by psychiatry who did not feel she was a threat to herself and did not feel that she met criteria for inpatient psychiatric hospitalization. They've cleared her for discharge and had rescinded involuntary commitment orders. They have recommended outpatient follow-up with psychiatry for further management.  She did have mild cellulitis under her breast line. She was started on a course of Bactrim. She will need further follow-up as an outpatient. Would also recommend further cancer screening with mammograms further breast evaluation.  Patient is otherwise stable for discharge  Procedures:    Consultations:  Psychiatry  Discharge Exam: Filed Vitals:   08/28/14 0700  BP: 111/50  Pulse: 65  Temp: 98.1 F (36.7 C)  Resp: 22    General: NAD Cardiovascular: S1, S2 RRR Respiratory: CTA B  Discharge Instructions   Discharge Instructions    Call MD for:  persistant dizziness or light-headedness    Complete by:  As directed      Diet - low sodium heart healthy    Complete by:  As directed      Increase activity slowly    Complete by:  As directed           Discharge Medication List as of 08/28/2014  2:56 PM    START taking these medications   Details  glipiZIDE (GLUCOTROL) 10 MG tablet Take 1 tablet (10 mg total) by mouth 2 (two) times daily before a meal., Starting 08/28/2014, Until Discontinued, Print    insulin glargine (LANTUS) 100 unit/mL SOPN Inject 0.1 mLs (10 Units total) into the skin daily., Starting 08/28/2014, Until Discontinued,  Print    metFORMIN (GLUCOPHAGE) 1000 MG tablet Take 1 tablet (1,000 mg total) by mouth 2 (two) times daily with a meal., Starting 08/28/2014, Until Discontinued, Print    ondansetron (ZOFRAN) 4 MG tablet Take 1 tablet (4 mg total) by mouth every 8 (eight) hours as needed for nausea or vomiting., Starting 08/28/2014, Until Discontinued, Print    sulfamethoxazole-trimethoprim  (BACTRIM DS,SEPTRA DS) 800-160 MG per tablet Take 1 tablet by mouth every 12 (twelve) hours., Starting 08/28/2014, Until Discontinued, Print      CONTINUE these medications which have NOT CHANGED   Details  Multiple Vitamin (MULTIVITAMIN WITH MINERALS) TABS tablet Take 1 tablet by mouth daily., Until Discontinued, Historical Med    pseudoephedrine-acetaminophen (TYLENOL SINUS) 30-500 MG TABS Take 2 tablets by mouth every 4 (four) hours as needed (for allergies)., Until Discontinued, Historical Med      STOP taking these medications     amoxicillin-clavulanate (AUGMENTIN) 875-125 MG per tablet      HYDROcodone-acetaminophen (NORCO/VICODIN) 5-325 MG per tablet        Allergies  Allergen Reactions  . Cocaine Anaphylaxis and Other (See Comments)    Eyes Swell Shut  . Procaine Hcl Anaphylaxis    Eyes swell shut  . Aripiprazole Other (See Comments)    Gives me "Knots in legs"  . Cogentin [Benztropine Mesylate] Other (See Comments)    Knots in legs  . Geodon [Ziprasidone Hcl] Other (See Comments)    Knots in Legs  . Haldol [Haloperidol Decanoate] Nausea And Vomiting    Leg stiffness and Knots in legs   Follow-up Information    Follow up with [facility] On 09/04/2014.   Why:  11:00 AM. Please get there earlier to complete paperwork.    Contact information:   [phone]      Follow up On 09/04/2014.   Why:  appt with dr.[email or web address] for follow up on feb 8,2016       The results of significant diagnostics from this hospitalization (including imaging, microbiology, ancillary and laboratory) are listed below for reference.    Significant Diagnostic Studies: No results found.  Microbiology: Recent Results (from the past 240 hour(s))  MRSA PCR Screening     Status: Abnormal   Collection Time: 08/25/14  6:50 PM  Result Value Ref Range Status   MRSA by PCR POSITIVE (A) NEGATIVE Final    Comment:        The GeneXpert MRSA Assay (FDA approved for NASAL specimens only), is one  component of a comprehensive MRSA colonization surveillance program. It is not intended to diagnose MRSA infection nor to guide or monitor treatment for MRSA infections. RESULT CALLED TO, READ BACK BY AND VERIFIED WITH: NIELSON,T ON 08/25/14 AT 2230 BY LOY,C      Labs: Basic Metabolic Panel:  Recent Labs Lab 08/25/14 1523 08/25/14 2150 08/26/14 0200 08/26/14 0719 08/27/14 0605  NA 125* 132* 133* 133* 137  K 3.6 2.8* 2.8* 3.5 3.5  CL 93* 102 103 105 108  CO2 _0 GLUCOSE 721* 146* 150* 215* 247*  BUN 7 5* 4* <5* 6  CREATININE 0.80 0.59 0.59 0.64 0.59  CALCIUM 9.0 8.1* 8.2* 7.9* 8.3*   Liver Function Tests:  Recent Labs Lab 08/25/14 1523  AST 46*  ALT 42*  ALKPHOS 140*  BILITOT 1.1  PROT 7.9  ALBUMIN 3.7   No results for input(s): LIPASE, AMYLASE in the last 168 hours. No results for input(s): AMMONIA in the last 168 hours.  CBC:  Recent Labs Lab 08/25/14 1523  WBC 6.4  NEUTROABS 4.3  HGB 14.6  HCT 41.1  MCV 93.2  PLT 125*   Cardiac Enzymes: No results for input(s): CKTOTAL, CKMB, CKMBINDEX, TROPONINI in the last 168 hours. BNP: BNP (last 3 results) No results for input(s): BNP in the last 8760 hours.  ProBNP (last 3 results) No results for input(s): PROBNP in the last 8760 hours.  CBG:  Recent Labs Lab 08/27/14 1138 08/27/14 1630 08/27/14 2140 08/28/14 0731 08/28/14 1148  GLUCAP 188* 211* 112* 163* 189*       Signed:  Appolonia Ackert  [facility] 08/28/2014, 7:38 PM

## 2014-08-28 NOTE — Clinical Social Work Psychosocial (Signed)
Clinical Social Work Department BRIEF PSYCHOSOCIAL ASSESSMENT 08/28/2014  Patient:  [name], [name]     Account Number:  [id number]     Admit date:  08/25/2014  Clinical Social Worker:  Wyatt Haste  Date/Time:  08/28/2014 01:48 PM  Referred by:  Physician  Date Referred:  08/28/2014 Referred for  Behavioral Health Issues   Other Referral:   Interview type:  Patient Other interview type:   husband    PSYCHOSOCIAL DATA Living Status:  HUSBAND Admitted from facility:   Level of care:   Primary support name:   Primary support relationship to patient:  SPOUSE Degree of support available:   supportive    CURRENT CONCERNS Current Concerns  Behavioral Health Issues   Other Concerns:    SOCIAL WORK ASSESSMENT / PLAN CSW met with pt and pt's husband at bedside. Pt alert and oriented and reports she has been d/c to home. She has been cleared by psychiatry per RN who spoke to NP who completed evaluation. Pt lives with her husband and her mother and mother-in-law live on either side of her. She states she has a strong support system. CSW discussed outpatient providers for follow up. Pt states she was seen at [facility] "a long time ago" and does not want to wait as long as she did in the past to see a psychiatrist. She requests to go to [facility]. CSW made appointment with therapist on 09/04/14 at 11 am and can be referred to psychiatrist there. Pt has transportation and is aware of appointment. She feels anxiety is her biggest problem. CSW faxed paperwork to clerk of court to rescind IVC and confirmed that it was received.   Assessment/plan status:  Psychosocial Support/Ongoing Assessment of Needs Other assessment/ plan:   Information/referral to community resources:   [facility]    PATIENT'S/FAMILY'S RESPONSE TO PLAN OF CARE: Pt appreciative of assistance with appointment. No other needs reported. CSW will sign off.       Benay Pike, [place]

## 2014-08-28 NOTE — Progress Notes (Signed)
Called unit 300 to check on Telepsych status. Encouraged nursing staff to delegate a nurse tech or other staff to put machine in room due to time-sensitive nature of consult. Staff in agreement to call us on the machine when set up.  Benjamine Mola, FNP-BC 08/28/2014    10:05 AM

## 2014-08-28 NOTE — Progress Notes (Signed)
Called unit 300 to ask Telepsych to be set up. Staff in agreement to call us on the machine when set up.  Benjamine Mola, FNP-BC 08/28/2014    09:37AM

## 2014-08-28 NOTE — Progress Notes (Signed)
Went into room to D/C IVs for pt discharge, pt had removed IVs themselves. Cleaned area around IV sites and applied clean guaze.

## 2014-10-19 ENCOUNTER — Emergency Department (HOSPITAL_COMMUNITY)
Admission: EM | Admit: 2014-10-19 | Discharge: 2014-10-19 | Disposition: A | Discharge status: Home / Self Care | Payer: MEDICAID | Attending: Emergency Medicine | Admitting: Emergency Medicine

## 2014-10-19 ENCOUNTER — Encounter (HOSPITAL_COMMUNITY): Payer: Self-pay

## 2014-10-19 DIAGNOSIS — E119 Type 2 diabetes mellitus without complications: Secondary | ICD-10-CM | POA: Diagnosis not present

## 2014-10-19 DIAGNOSIS — Z79899 Other long term (current) drug therapy: Secondary | ICD-10-CM | POA: Insufficient documentation

## 2014-10-19 DIAGNOSIS — Z792 Long term (current) use of antibiotics: Secondary | ICD-10-CM | POA: Insufficient documentation

## 2014-10-19 DIAGNOSIS — Z720 Tobacco use: Secondary | ICD-10-CM | POA: Insufficient documentation

## 2014-10-19 DIAGNOSIS — F419 Anxiety disorder, unspecified: Secondary | ICD-10-CM | POA: Insufficient documentation

## 2014-10-19 DIAGNOSIS — Z794 Long term (current) use of insulin: Secondary | ICD-10-CM | POA: Insufficient documentation

## 2014-10-19 MED ORDER — LORAZEPAM 1 MG PO TABS
1.0000 mg | ORAL_TABLET | Freq: Once | ORAL | Status: AC
  Administered 2014-10-19: 1 mg via ORAL
  Filled 2014-10-19: qty 1

## 2014-10-19 NOTE — Discharge Instructions (Signed)
Follow-up with [facility].  Avoid drugs and alcohol

## 2014-10-19 NOTE — ED Provider Notes (Signed)
CSN: [id number]     Arrival date & time 10/19/14  2048 History  This chart was scribed for Andrea Christen, MD by Delphia Grates, ED Scribe. This patient was seen in room APA17/APA17 and the patient's care was started at 9:11 PM.   Chief Complaint  Patient presents with  . Anxiety    The history is provided by the patient. No language interpreter was used.     HPI Comments: [name] is a 49 y.o. female, with history of bipolar 1 disorder, schizoaffective disorder, and psychotic disorder, brought in by ambulance, who presents to the Emergency Department complaining of a panic attack that occurred PTA. Patient states she feels "not so safe" and reports she has a "few enemies" that do not want to see her live. She states she is currently not taken any medications because she needs to "feel safe". She is also complaining of a flair up of her sinuses and reports her BP is elevated. She denies SI/HI.    Past Medical History  Diagnosis Date  . Bipolar 1 disorder     pt denies, says was misdiagnosed  . Diabetes mellitus   . Fibromyalgia   . Anxiety   . Panic attack   . Chronic back pain   . Chronic chest wall pain   . Chronic right shoulder pain   . Schizoaffective disorder   . Psychotic disorder    Past Surgical History  Procedure Laterality Date  . Mandible surgery    . Cholecystectomy     Family History  Problem Relation Age of Onset  . Heart failure Mother   . Heart failure Other    History  Substance Use Topics  . Smoking status: Current Every Day Smoker -- 1.00 packs/day for 30 years    Types: Cigarettes  . Smokeless tobacco: Never Used  . Alcohol Use: No   OB History    Gravida Para Term Preterm AB TAB SAB Ectopic Multiple Living   4    2  2         Review of Systems  A complete 10 system review of systems was obtained and all systems are negative except as noted in the HPI and PMH.    Allergies  Cocaine; Procaine hcl; Aripiprazole; Cogentin; Geodon; and  Haldol  Home Medications   Prior to Admission medications   Medication Sig Start Date End Date Taking? Authorizing Provider  Multiple Vitamin (MULTIVITAMIN WITH MINERALS) TABS tablet Take 1 tablet by mouth daily.   Yes Historical Provider, MD  glipiZIDE (GLUCOTROL) 10 MG tablet Take 1 tablet (10 mg total) by mouth 2 (two) times daily before a meal. Patient not taking: Reported on 10/19/2014 08/28/14   Kathie Dike, MD  insulin glargine (LANTUS) 100 unit/mL SOPN Inject 0.1 mLs (10 Units total) into the skin daily. Patient not taking: Reported on 10/19/2014 08/28/14   Kathie Dike, MD  metFORMIN (GLUCOPHAGE) 1000 MG tablet Take 1 tablet (1,000 mg total) by mouth 2 (two) times daily with a meal. Patient not taking: Reported on 10/19/2014 08/28/14   Kathie Dike, MD  sulfamethoxazole-trimethoprim (BACTRIM DS,SEPTRA DS) 800-160 MG per tablet Take 1 tablet by mouth every 12 (twelve) hours. Patient not taking: Reported on 10/19/2014 08/28/14   Kathie Dike, MD   Triage Vitals: BP 114/61 mmHg  Pulse 98  Temp(Src) 98.1 F (36.7 C) (Oral)  Resp 24  Ht 5\' 4"  (1.626 m)  Wt 270 lb (122.471 kg)  BMI 46.32 kg/m2  SpO2 97%  LMP 01/24/2013  Physical Exam  Constitutional: She is oriented to person, place, and time. She appears well-developed and well-nourished.  HENT:  Head: Normocephalic and atraumatic.  Eyes: Conjunctivae and EOM are normal. Pupils are equal, round, and reactive to light.  Neck: Normal range of motion. Neck supple.  Cardiovascular: Normal rate and regular rhythm.   Pulmonary/Chest: Effort normal and breath sounds normal.  Abdominal: Soft. Bowel sounds are normal.  Musculoskeletal: Normal range of motion.  Neurological: She is alert and oriented to person, place, and time.  Skin: Skin is warm and dry.  Psychiatric: Her speech is rapid and/or pressured.  Pressured speech. Flight of ideas.  Nursing note and vitals reviewed.   ED Course  Procedures (including critical care  time)  DIAGNOSTIC STUDIES: Oxygen Saturation is 97% on room air, adequate by my interpretation.    COORDINATION OF CARE: At 2116 Discussed treatment plan with patient. Patient agrees.   Labs Review Labs Reviewed - No data to display  Imaging Review No results found.   EKG Interpretation None      MDM   Final diagnoses:  Anxiety   Conflicting history given by patient to nurse and doctor. She is not psychotic. No suicidal or homicidal ideation. She was observed for one hour. She feels better after Ativan 1 mg. Patient will be discharged home     Andrea Christen, MD 10/20/14 [phone]

## 2014-10-19 NOTE — ED Notes (Signed)
Patient also states she has a "flair up of her sinuses" and its making it hard to breath. Patient states she had anxiety and made her blood pressure up.

## 2014-10-19 NOTE — ED Notes (Signed)
Patient via [facility]. Per EMS patient was having a panic attack because patient states she was having an allergic reaction to either that her boyfriend uses to make cocaine and heroin.

## 2014-10-19 NOTE — ED Notes (Signed)
Patient verbalizes understanding of discharge instructions home care and follow up care. patient ambulatory out of department at this time

## 2015-02-18 ENCOUNTER — Emergency Department (HOSPITAL_COMMUNITY): Payer: Medicaid Other

## 2015-02-18 ENCOUNTER — Emergency Department (HOSPITAL_COMMUNITY)
Admission: EM | Admit: 2015-02-18 | Discharge: 2015-02-18 | Disposition: A | Discharge status: Home / Self Care | Payer: Medicaid Other | Attending: Emergency Medicine | Admitting: Emergency Medicine

## 2015-02-18 ENCOUNTER — Encounter (HOSPITAL_COMMUNITY): Payer: Self-pay | Admitting: Cardiology

## 2015-02-18 DIAGNOSIS — Z720 Tobacco use: Secondary | ICD-10-CM | POA: Insufficient documentation

## 2015-02-18 DIAGNOSIS — Z8739 Personal history of other diseases of the musculoskeletal system and connective tissue: Secondary | ICD-10-CM | POA: Insufficient documentation

## 2015-02-18 DIAGNOSIS — Z79899 Other long term (current) drug therapy: Secondary | ICD-10-CM | POA: Insufficient documentation

## 2015-02-18 DIAGNOSIS — E119 Type 2 diabetes mellitus without complications: Secondary | ICD-10-CM | POA: Diagnosis not present

## 2015-02-18 DIAGNOSIS — E669 Obesity, unspecified: Secondary | ICD-10-CM | POA: Insufficient documentation

## 2015-02-18 DIAGNOSIS — R079 Chest pain, unspecified: Secondary | ICD-10-CM | POA: Diagnosis present

## 2015-02-18 DIAGNOSIS — R0789 Other chest pain: Secondary | ICD-10-CM | POA: Diagnosis not present

## 2015-02-18 DIAGNOSIS — Z8659 Personal history of other mental and behavioral disorders: Secondary | ICD-10-CM | POA: Diagnosis not present

## 2015-02-18 DIAGNOSIS — Z794 Long term (current) use of insulin: Secondary | ICD-10-CM | POA: Insufficient documentation

## 2015-02-18 DIAGNOSIS — G8929 Other chronic pain: Secondary | ICD-10-CM | POA: Diagnosis not present

## 2015-02-18 LAB — CBC
HCT: 39.6 % (ref 36.0–46.0)
Hemoglobin: 13.8 g/dL (ref 12.0–15.0)
MCH: 33.3 pg (ref 26.0–34.0)
MCHC: 34.8 g/dL (ref 30.0–36.0)
MCV: 95.4 fL (ref 78.0–100.0)
Platelets: 130 10*3/uL — ABNORMAL LOW (ref 150–400)
RBC: 4.15 MIL/uL (ref 3.87–5.11)
RDW: 12.8 % (ref 11.5–15.5)
WBC: 5.7 10*3/uL (ref 4.0–10.5)

## 2015-02-18 LAB — TROPONIN I: Troponin I: <0.03 ng/mL (ref <0.031)

## 2015-02-18 LAB — BASIC METABOLIC PANEL
ANION GAP: 7 (ref 5–15)
BUN: 5 mg/dL — ABNORMAL LOW (ref 6–20)
CHLORIDE: 106 mmol/L (ref 101–111)
CO2: 24 mmol/L (ref 22–32)
Calcium: 8.7 mg/dL — ABNORMAL LOW (ref 8.9–10.3)
Creatinine, Ser: 0.64 mg/dL (ref 0.44–1.00)
GFR calc non Af Amer: >60 mL/min (ref >60)
Glucose, Bld: 112 mg/dL — ABNORMAL HIGH (ref 65–99)
Potassium: 3.3 mmol/L — ABNORMAL LOW (ref 3.5–5.1)
Sodium: 137 mmol/L (ref 135–145)

## 2015-02-18 MED ORDER — KETOROLAC TROMETHAMINE 30 MG/ML IJ SOLN
30.0000 mg | Freq: Once | INTRAMUSCULAR | Status: AC
  Administered 2015-02-18: 30 mg via INTRAVENOUS
  Filled 2015-02-18: qty 1

## 2015-02-18 MED ORDER — POTASSIUM CHLORIDE CRYS ER 20 MEQ PO TBCR
40.0000 meq | EXTENDED_RELEASE_TABLET | Freq: Once | ORAL | Status: AC
  Administered 2015-02-18: 40 meq via ORAL
  Filled 2015-02-18: qty 2

## 2015-02-18 NOTE — Discharge Instructions (Signed)
Take ibuprofen 400 mg 3 times a day as needed for pain. Try using heat on the sore area of your chest, 3-4 times a day. Call your doctor in the morning to arrange a follow-up appointment.     Chest Wall Pain Chest wall pain is pain in or around the bones and muscles of your chest. It may take up to 6 weeks to get better. It may take longer if you must stay physically active in your work and activities.  CAUSES  Chest wall pain may happen on its own. However, it may be caused by:  A viral illness like the flu.  Injury.  Coughing.  Exercise.  Arthritis.  Fibromyalgia.  Shingles. HOME CARE INSTRUCTIONS   Avoid overtiring physical activity. Try not to strain or perform activities that cause pain. This includes any activities using your chest or your abdominal and side muscles, especially if heavy weights are used.  Put ice on the sore area.  Put ice in a plastic bag.  Place a towel between your skin and the bag.  Leave the ice on for 15-20 minutes per hour while awake for the first 2 days.  Only take over-the-counter or prescription medicines for pain, discomfort, or fever as directed by your caregiver. SEEK IMMEDIATE MEDICAL CARE IF:   Your pain increases, or you are very uncomfortable.  You have a fever.  Your chest pain becomes worse.  You have new, unexplained symptoms.  You have nausea or vomiting.  You feel sweaty or lightheaded.  You have a cough with phlegm (sputum), or you cough up blood. MAKE SURE YOU:   Understand these instructions.  Will watch your condition.  Will get help right away if you are not doing well or get worse. Document Released: 07/14/2005 Document Revised: 10/06/2011 Document Reviewed: 03/10/2011 [facility] Patient Information 2015 [place], [place]. This information is not intended to replace advice given to you by your health care provider. Make sure you discuss any questions you have with your health care provider.

## 2015-02-18 NOTE — ED Notes (Addendum)
Chest pain since this morning that has been getting worse.  Gave 1 sl ntg and 4 baby aspirin at home.  No relief after ntg. CBG 114.  Pt does not take any medicines or see her PCP.   Pt states she has this pain off and on for the past two years ,  That the pain usually goes away on it own.

## 2015-02-18 NOTE — ED Provider Notes (Signed)
CSN: [id number]     Arrival date & time 02/18/15  1401 History   First MD Initiated Contact with Patient 02/18/15 1441     Chief Complaint  Patient presents with  . Chest Pain     (Consider location/radiation/quality/duration/timing/severity/associated sxs/prior Treatment) HPI   [name] is a 49 y.o. female who presents for evaluation of chest pain. The pain was there upon awaking this morning. The pain feels like "a bullet". The pain is 10 over 10, and constant. She denies associated fever, chills, nausea, vomiting, diaphoresis, weakness or dizziness. She states that she has congestive heart failure, but does not take medicine for it or see a doctor for it. She is taking her  medicines as prescribed. There are no other known modifying factors.    Past Medical History  Diagnosis Date  . Bipolar 1 disorder     pt denies, says was misdiagnosed  . Diabetes mellitus   . Fibromyalgia   . Anxiety   . Panic attack   . Chronic back pain   . Chronic chest wall pain   . Chronic right shoulder pain   . Schizoaffective disorder   . Psychotic disorder    Past Surgical History  Procedure Laterality Date  . Mandible surgery    . Cholecystectomy     Family History  Problem Relation Age of Onset  . Heart failure Mother   . Heart failure Other    History  Substance Use Topics  . Smoking status: Current Every Day Smoker -- 1.00 packs/day for 30 years    Types: Cigarettes  . Smokeless tobacco: Never Used  . Alcohol Use: No   OB History    Gravida Para Term Preterm AB TAB SAB Ectopic Multiple Living   4    2  2         Review of Systems  All other systems reviewed and are negative.     Allergies  Cocaine; Procaine hcl; Aripiprazole; Cogentin; Geodon; and Haldol  Home Medications   Prior to Admission medications   Medication Sig Start Date End Date Taking? Authorizing Provider  glipiZIDE (GLUCOTROL) 10 MG tablet Take 1 tablet (10 mg total) by mouth 2 (two) times  daily before a meal. Patient not taking: Reported on 10/19/2014 08/28/14   Kathie Dike, MD  insulin glargine (LANTUS) 100 unit/mL SOPN Inject 0.1 mLs (10 Units total) into the skin daily. Patient not taking: Reported on 10/19/2014 08/28/14   Kathie Dike, MD  metFORMIN (GLUCOPHAGE) 1000 MG tablet Take 1 tablet (1,000 mg total) by mouth 2 (two) times daily with a meal. Patient not taking: Reported on 10/19/2014 08/28/14   Kathie Dike, MD  Multiple Vitamin (MULTIVITAMIN WITH MINERALS) TABS tablet Take 1 tablet by mouth daily.    Historical Provider, MD  sulfamethoxazole-trimethoprim (BACTRIM DS,SEPTRA DS) 800-160 MG per tablet Take 1 tablet by mouth every 12 (twelve) hours. Patient not taking: Reported on 10/19/2014 08/28/14   Kathie Dike, MD   BP 109/64 mmHg  Pulse 55  Temp(Src) 97.5 F (36.4 C) (Oral)  Resp 10  Ht 5\' 4"  (1.626 m)  Wt 270 lb (122.471 kg)  BMI 46.32 kg/m2  SpO2 91%  LMP 01/24/2013 Physical Exam  Constitutional: She is oriented to person, place, and time. She appears well-developed. She appears distressed (She is clutching her chest, with both hands).  Obese. She appears older than stated age.  HENT:  Head: Normocephalic and atraumatic.  Right Ear: External ear normal.  Left Ear: External ear normal.  Eyes: Conjunctivae and EOM are normal. Pupils are equal, round, and reactive to light.  Neck: Normal range of motion and phonation normal. Neck supple.  Cardiovascular: Normal rate, regular rhythm and normal heart sounds.   Pulmonary/Chest: Effort normal and breath sounds normal. No respiratory distress. She has no wheezes. She has no rales. She exhibits tenderness (Mild diffuse anterior tenderness without crepitation or deformity). She exhibits no bony tenderness.  Abdominal: Soft. There is no tenderness.  Musculoskeletal: Normal range of motion. She exhibits no edema or tenderness.  Neurological: She is alert and oriented to person, place, and time. No cranial nerve  deficit or sensory deficit. She exhibits normal muscle tone. Coordination normal.  Skin: Skin is warm, dry and intact.  Psychiatric: She has a normal mood and affect. Her behavior is normal. Judgment and thought content normal.  Nursing note and vitals reviewed.   ED Course  Procedures (including critical care time)  Constant chest pain for greater than 6 hours, presenting with unchanged EKG. , No clinical evidence for acute coronary process. She will be evaluated with single troponin, treated for pain, and observed for a period of time.  Medications - No data to display  Patient Vitals for the past 24 hrs:  BP Temp Temp src Pulse Resp SpO2 Height Weight  02/18/15 1430 109/64 mmHg - - (!) 55 10 91 % - -  02/18/15 1406 118/68 mmHg 97.5 F (36.4 C) Oral 80 18 96 % 5\' 4"  (1.626 m) 270 lb (122.471 kg)    4:59 PM Reevaluation with update and discussion. After initial assessment and treatment, an updated evaluation reveals she is somewhat more comfortable at this time. Findings discussed with the patient, all questions were answered. [place] Review Labs Reviewed  CBC - Abnormal; Notable for the following:    Platelets 130 (*)    All other components within normal limits  BASIC METABOLIC PANEL  TROPONIN I    Imaging Review Dg Chest 2 View  02/18/2015   CLINICAL DATA:  Chest pain beginning today.  EXAM: CHEST  2 VIEW  COMPARISON:  PA and lateral chest 01/24/2014 and 10/29/2013.  FINDINGS: The lungs are clear. Heart size is normal. No pneumothorax or pleural effusion. No focal bony abnormality.  IMPRESSION: Negative chest.   Electronically Signed   By: Inge Rise M.D.   On: 02/18/2015 15:07     EKG Interpretation   Date/Time:  Sunday February 18 2015 14:09:30 EDT Ventricular Rate:  67 PR Interval:  141 QRS Duration: 103 QT Interval:  419 QTC Calculation: 442 R Axis:   66 Text Interpretation:  Sinus rhythm Low voltage, precordial leads Baseline  wander in  lead(s) I III aVR aVL since last tracing no significant change  Confirmed by Maribeth Jiles  MD, Alister Staver ([id number]) on 02/18/2015 3:03:29 PM      MDM   Final diagnoses:  Chest wall pain    Nonspecific chest pain, most likely related to chest wall discomfort. Doubt PE, ACS, pneumonia, serous bacterial infection or metabolic instability.   Nursing Notes Reviewed/ Care Coordinated Applicable Imaging Reviewed Interpretation of Laboratory Data incorporated into ED treatment  The patient appears reasonably screened and/or stabilized for discharge and I doubt any other medical condition or other [facility] requiring further screening, evaluation, or treatment in the ED at this time prior to discharge.  Plan: Home Medications- IBU; Home Treatments- rest; return here if the recommended treatment, does not improve the symptoms; Recommended follow up- PCP prn  Daleen Bo, MD 02/18/15 1700

## 2015-05-08 ENCOUNTER — Encounter (HOSPITAL_COMMUNITY): Payer: Self-pay | Admitting: *Deleted

## 2015-05-08 ENCOUNTER — Emergency Department (HOSPITAL_COMMUNITY)
Admission: EM | Admit: 2015-05-08 | Discharge: 2015-05-08 | Disposition: A | Discharge status: Home / Self Care | Payer: Medicaid Other | Attending: Emergency Medicine | Admitting: Emergency Medicine

## 2015-05-08 ENCOUNTER — Emergency Department (HOSPITAL_COMMUNITY): Payer: Medicaid Other

## 2015-05-08 DIAGNOSIS — W01198A Fall on same level from slipping, tripping and stumbling with subsequent striking against other object, initial encounter: Secondary | ICD-10-CM | POA: Insufficient documentation

## 2015-05-08 DIAGNOSIS — Z8739 Personal history of other diseases of the musculoskeletal system and connective tissue: Secondary | ICD-10-CM | POA: Diagnosis not present

## 2015-05-08 DIAGNOSIS — E119 Type 2 diabetes mellitus without complications: Secondary | ICD-10-CM | POA: Diagnosis not present

## 2015-05-08 DIAGNOSIS — M545 Low back pain, unspecified: Secondary | ICD-10-CM

## 2015-05-08 DIAGNOSIS — Y92002 Bathroom of unspecified non-institutional (private) residence single-family (private) house as the place of occurrence of the external cause: Secondary | ICD-10-CM | POA: Diagnosis not present

## 2015-05-08 DIAGNOSIS — S3992XA Unspecified injury of lower back, initial encounter: Secondary | ICD-10-CM | POA: Insufficient documentation

## 2015-05-08 DIAGNOSIS — Z720 Tobacco use: Secondary | ICD-10-CM | POA: Diagnosis not present

## 2015-05-08 DIAGNOSIS — Y9389 Activity, other specified: Secondary | ICD-10-CM | POA: Diagnosis not present

## 2015-05-08 DIAGNOSIS — Z8659 Personal history of other mental and behavioral disorders: Secondary | ICD-10-CM | POA: Diagnosis not present

## 2015-05-08 DIAGNOSIS — G8929 Other chronic pain: Secondary | ICD-10-CM | POA: Diagnosis not present

## 2015-05-08 DIAGNOSIS — Y998 Other external cause status: Secondary | ICD-10-CM | POA: Insufficient documentation

## 2015-05-08 DIAGNOSIS — I509 Heart failure, unspecified: Secondary | ICD-10-CM | POA: Insufficient documentation

## 2015-05-08 LAB — CBG MONITORING, ED: Glucose-Capillary: 134 mg/dL — ABNORMAL HIGH (ref 65–99)

## 2015-05-08 MED ORDER — KETOROLAC TROMETHAMINE 60 MG/2ML IM SOLN
60.0000 mg | Freq: Once | INTRAMUSCULAR | Status: AC
  Administered 2015-05-08: 60 mg via INTRAMUSCULAR
  Filled 2015-05-08: qty 2

## 2015-05-08 MED ORDER — METHOCARBAMOL 500 MG PO TABS: 500.0000 mg | ORAL_TABLET | Freq: Two times a day (BID) | ORAL | Status: DC

## 2015-05-08 MED ORDER — METFORMIN HCL 1000 MG PO TABS: 1000.0000 mg | ORAL_TABLET | Freq: Two times a day (BID) | ORAL | Status: DC

## 2015-05-08 MED ORDER — ALBUTEROL SULFATE HFA 108 (90 BASE) MCG/ACT IN AERS: 1.0000 | INHALATION_SPRAY | Freq: Four times a day (QID) | RESPIRATORY_TRACT | Status: DC | PRN

## 2015-05-08 NOTE — ED Notes (Addendum)
Pt brought in by [facility]. EMS reports she fell yesterday about 0300 in the bathroom. No LOC. Pt reports she went to reach for the shower curtain and missed it, pt had no weakness in her legs or dizziness before the fall. C/o mid to lower back pain.

## 2015-05-08 NOTE — ED Notes (Signed)
Pt requesting the inhaler now - PA [place] informed -

## 2015-05-08 NOTE — ED Notes (Addendum)
Pt seen by [place] --pt declined offer of social worker or any kind of assistance- Pt also declined scripts for inhaler and metformin .

## 2015-05-08 NOTE — Discharge Instructions (Signed)

## 2015-05-08 NOTE — ED Notes (Signed)
Pt requesting something for pain -- unable to locate PA sofia at this time.

## 2015-05-08 NOTE — ED Provider Notes (Signed)
CSN: [id number]     Arrival date & time 05/08/15  6283 History   First MD Initiated Contact with Patient 05/08/15 [phone]     Chief Complaint  Patient presents with  . Fall  . Back Pain     (Consider location/radiation/quality/duration/timing/severity/associated sxs/prior Treatment) Patient is a 49 y.o. female presenting with back pain. The history is provided by the patient. No language interpreter was used.  Back Pain Location:  Lumbar spine Quality:  Aching Radiates to:  Does not radiate Pain severity:  Moderate Onset quality:  Sudden Duration:  1 day Timing:  Constant Progression:  Worsening Chronicity:  New Context: falling   Relieved by:  Nothing Worsened by:  Nothing tried Ineffective treatments:  None tried Risk factors: no hx of osteoporosis   Pt reports she fell last night and hit her back.   Pt complains of pain.  Pt reports she has a history of diabetes and chf.   Pt reports she does not take her medications or use her inhaler.  Pt reports she worrys that someone will put cocaine in her inhaler Pt reports people around her use a lot of drugs. Pt reports she occasionally takes a xanax or a pain pill but does not take any of her other medications  Past Medical History  Diagnosis Date  . Bipolar 1 disorder ([place])     pt denies, says was misdiagnosed  . Diabetes mellitus   . Fibromyalgia   . Anxiety   . Panic attack   . Chronic back pain   . Chronic chest wall pain   . Chronic right shoulder pain   . Schizoaffective disorder ([place])   . Psychotic disorder   . CHF (congestive heart failure) [facility])    Past Surgical History  Procedure Laterality Date  . Mandible surgery    . Cholecystectomy     Family History  Problem Relation Age of Onset  . Heart failure Mother   . Heart failure Other    Social History  Substance Use Topics  . Smoking status: Current Every Day Smoker -- 1.00 packs/day for 30 years    Types: Cigarettes  . Smokeless tobacco: Never Used  .  Alcohol Use: No   OB History    Gravida Para Term Preterm AB TAB SAB Ectopic Multiple Living   4    2  2         Review of Systems  Musculoskeletal: Positive for back pain.  All other systems reviewed and are negative.     Allergies  Cocaine; Procaine hcl; Aripiprazole; Cogentin; Geodon; and Haldol  Home Medications   Prior to Admission medications   Medication Sig Start Date End Date Taking? Authorizing Provider  glipiZIDE (GLUCOTROL) 10 MG tablet Take 1 tablet (10 mg total) by mouth 2 (two) times daily before a meal. Patient not taking: Reported on 10/19/2014 08/28/14   Kathie Dike, MD  insulin glargine (LANTUS) 100 unit/mL SOPN Inject 0.1 mLs (10 Units total) into the skin daily. Patient not taking: Reported on 10/19/2014 08/28/14   Kathie Dike, MD  metFORMIN (GLUCOPHAGE) 1000 MG tablet Take 1 tablet (1,000 mg total) by mouth 2 (two) times daily with a meal. Patient not taking: Reported on 10/19/2014 08/28/14   Kathie Dike, MD  sulfamethoxazole-trimethoprim (BACTRIM DS,SEPTRA DS) 800-160 MG per tablet Take 1 tablet by mouth every 12 (twelve) hours. Patient not taking: Reported on 10/19/2014 08/28/14   Kathie Dike, MD   BP 127/78 mmHg  Pulse 77  Temp(Src) 97.5 F (36.4  C) (Oral)  Resp 22  Ht 5\' 1"  (1.549 m)  Wt 270 lb (122.471 kg)  BMI 51.04 kg/m2  SpO2 100%  LMP 01/24/2013 Physical Exam  Constitutional: She is oriented to person, place, and time. She appears well-developed and well-nourished.  HENT:  Head: Normocephalic and atraumatic.  Eyes: Conjunctivae are normal. Pupils are equal, round, and reactive to light.  Neck: Normal range of motion.  Cardiovascular: Normal rate and normal heart sounds.   Pulmonary/Chest: Effort normal.  Abdominal: Soft.  Musculoskeletal: She exhibits tenderness.  Neurological: She is alert and oriented to person, place, and time.  Skin: Skin is warm.  Psychiatric: She has a normal mood and affect.  Nursing note and vitals  reviewed.   ED Course  Procedures (including critical care time) Labs Review Labs Reviewed - No data to display  Imaging Review No results found. I have personally reviewed and evaluated these images and lab results as part of my medical decision-making.   EKG Interpretation None      MDM  Pt declined Education officer, museum consult.  Pt does not feel she needs emergent shelter. Pt reports the person she is concerned about is moving out and police are aware.  No suicidal or homicidal thoughts.     Final diagnoses:  Bilateral low back pain without sciatica    Metformin dx Robaxin Pt declined inhaler,  She is worried her room mate will use it to harm her.     Hollace Kinnier Zearing, PA-C 05/08/15 [place], MD 05/10/15 [phone]

## 2015-05-08 NOTE — ED Notes (Signed)
Patient ambulated to bathroom without assistance. 

## 2015-05-08 NOTE — ED Notes (Signed)
Pt verbalized her displeasure during administration of toradol that she didn't receive any narcotic pain meds. Stated that "druggies can come in here and get everything they want "   Pt ambulated off the unit at this time .

## 2015-08-07 ENCOUNTER — Encounter (HOSPITAL_COMMUNITY): Payer: Self-pay | Admitting: Emergency Medicine

## 2015-08-07 ENCOUNTER — Emergency Department (HOSPITAL_COMMUNITY)
Admission: EM | Admit: 2015-08-07 | Discharge: 2015-08-08 | Disposition: A | Discharge status: Home / Self Care | Payer: Medicaid Other | Attending: Emergency Medicine | Admitting: Emergency Medicine

## 2015-08-07 DIAGNOSIS — Z8739 Personal history of other diseases of the musculoskeletal system and connective tissue: Secondary | ICD-10-CM | POA: Diagnosis not present

## 2015-08-07 DIAGNOSIS — F1290 Cannabis use, unspecified, uncomplicated: Secondary | ICD-10-CM | POA: Diagnosis not present

## 2015-08-07 DIAGNOSIS — R0789 Other chest pain: Secondary | ICD-10-CM | POA: Insufficient documentation

## 2015-08-07 DIAGNOSIS — F17210 Nicotine dependence, cigarettes, uncomplicated: Secondary | ICD-10-CM | POA: Diagnosis not present

## 2015-08-07 DIAGNOSIS — F1210 Cannabis abuse, uncomplicated: Secondary | ICD-10-CM | POA: Diagnosis present

## 2015-08-07 DIAGNOSIS — E119 Type 2 diabetes mellitus without complications: Secondary | ICD-10-CM | POA: Insufficient documentation

## 2015-08-07 DIAGNOSIS — G8929 Other chronic pain: Secondary | ICD-10-CM | POA: Diagnosis not present

## 2015-08-07 DIAGNOSIS — I509 Heart failure, unspecified: Secondary | ICD-10-CM | POA: Insufficient documentation

## 2015-08-07 DIAGNOSIS — Z79899 Other long term (current) drug therapy: Secondary | ICD-10-CM | POA: Insufficient documentation

## 2015-08-07 DIAGNOSIS — F29 Unspecified psychosis not due to a substance or known physiological condition: Secondary | ICD-10-CM | POA: Insufficient documentation

## 2015-08-07 DIAGNOSIS — Z008 Encounter for other general examination: Secondary | ICD-10-CM | POA: Diagnosis present

## 2015-08-07 DIAGNOSIS — Z7984 Long term (current) use of oral hypoglycemic drugs: Secondary | ICD-10-CM | POA: Diagnosis not present

## 2015-08-07 DIAGNOSIS — F250 Schizoaffective disorder, bipolar type: Secondary | ICD-10-CM | POA: Diagnosis present

## 2015-08-07 LAB — URINALYSIS, ROUTINE W REFLEX MICROSCOPIC
Bilirubin Urine: NEGATIVE
Glucose, UA: NEGATIVE mg/dL
Hgb urine dipstick: NEGATIVE
Ketones, ur: NEGATIVE mg/dL
Leukocytes, UA: NEGATIVE
Nitrite: NEGATIVE
PROTEIN: NEGATIVE mg/dL
Specific Gravity, Urine: <1.005 — ABNORMAL LOW (ref 1.005–1.030)
pH: 5.5 (ref 5.0–8.0)

## 2015-08-07 LAB — RAPID URINE DRUG SCREEN, HOSP PERFORMED
AMPHETAMINES: NOT DETECTED
Barbiturates: NOT DETECTED
Benzodiazepines: NOT DETECTED
Cocaine: NOT DETECTED
OPIATES: NOT DETECTED
Tetrahydrocannabinol: POSITIVE — AB

## 2015-08-07 LAB — COMPREHENSIVE METABOLIC PANEL
ALT: 28 U/L (ref 14–54)
AST: 38 U/L (ref 15–41)
Albumin: 3.4 g/dL — ABNORMAL LOW (ref 3.5–5.0)
Alkaline Phosphatase: 96 U/L (ref 38–126)
Anion gap: 7 (ref 5–15)
BILIRUBIN TOTAL: 0.8 mg/dL (ref 0.3–1.2)
BUN: 6 mg/dL (ref 6–20)
CO2: 26 mmol/L (ref 22–32)
Calcium: 9.1 mg/dL (ref 8.9–10.3)
Chloride: 104 mmol/L (ref 101–111)
Creatinine, Ser: 0.74 mg/dL (ref 0.44–1.00)
GFR calc Af Amer: >60 mL/min (ref >60)
Glucose, Bld: 141 mg/dL — ABNORMAL HIGH (ref 65–99)
Potassium: 3.3 mmol/L — ABNORMAL LOW (ref 3.5–5.1)
Sodium: 137 mmol/L (ref 135–145)
TOTAL PROTEIN: 7.2 g/dL (ref 6.5–8.1)

## 2015-08-07 LAB — CBC WITH DIFFERENTIAL/PLATELET
BASOS ABS: 0 10*3/uL (ref 0.0–0.1)
BASOS PCT: 0 %
EOS ABS: 0 10*3/uL (ref 0.0–0.7)
Eosinophils Relative: 1 %
HCT: 40 % (ref 36.0–46.0)
Hemoglobin: 13.9 g/dL (ref 12.0–15.0)
Lymphocytes Relative: 18 %
Lymphs Abs: 1.1 10*3/uL (ref 0.7–4.0)
MCH: 33 pg (ref 26.0–34.0)
MCHC: 34.8 g/dL (ref 30.0–36.0)
MCV: 95 fL (ref 78.0–100.0)
Monocytes Absolute: 0.3 10*3/uL (ref 0.1–1.0)
Monocytes Relative: 5 %
Neutro Abs: 4.8 10*3/uL (ref 1.7–7.7)
Neutrophils Relative %: 76 %
PLATELETS: 137 10*3/uL — AB (ref 150–400)
RBC: 4.21 MIL/uL (ref 3.87–5.11)
RDW: 12.9 % (ref 11.5–15.5)
WBC: 6.3 10*3/uL (ref 4.0–10.5)

## 2015-08-07 LAB — TROPONIN I

## 2015-08-07 LAB — ACETAMINOPHEN LEVEL: Acetaminophen (Tylenol), Serum: <10 ug/mL — ABNORMAL LOW (ref 10–30)

## 2015-08-07 LAB — PREGNANCY, URINE: Preg Test, Ur: NEGATIVE

## 2015-08-07 LAB — SALICYLATE LEVEL

## 2015-08-07 LAB — CBG MONITORING, ED: GLUCOSE-CAPILLARY: 140 mg/dL — AB (ref 65–99)

## 2015-08-07 LAB — ETHANOL

## 2015-08-07 MED ORDER — GLIPIZIDE 10 MG PO TABS
10.0000 mg | ORAL_TABLET | Freq: Two times a day (BID) | ORAL | Status: DC
  Administered 2015-08-08: 10 mg via ORAL
  Filled 2015-08-07 (×5): qty 1

## 2015-08-07 MED ORDER — METFORMIN HCL 500 MG PO TABS
1000.0000 mg | ORAL_TABLET | Freq: Two times a day (BID) | ORAL | Status: DC
  Administered 2015-08-08: 1000 mg via ORAL
  Filled 2015-08-07: qty 2

## 2015-08-07 MED ORDER — ALBUTEROL SULFATE HFA 108 (90 BASE) MCG/ACT IN AERS: 1.0000 | INHALATION_SPRAY | Freq: Four times a day (QID) | RESPIRATORY_TRACT | Status: DC | PRN

## 2015-08-07 MED ORDER — METHOCARBAMOL 500 MG PO TABS
500.0000 mg | ORAL_TABLET | Freq: Two times a day (BID) | ORAL | Status: DC
  Administered 2015-08-08 (×2): 500 mg via ORAL
  Filled 2015-08-07 (×2): qty 1

## 2015-08-07 MED ORDER — GI COCKTAIL ~~LOC~~
30.0000 mL | Freq: Once | ORAL | Status: AC
  Administered 2015-08-07: 30 mL via ORAL
  Filled 2015-08-07: qty 30

## 2015-08-07 MED ORDER — INSULIN GLARGINE 100 UNITS/ML SOLOSTAR PEN: 10.0000 [IU] | PEN_INJECTOR | Freq: Every day | SUBCUTANEOUS | Status: DC

## 2015-08-07 NOTE — ED Notes (Signed)
Per EMS pt called them to the residence for being scared of the man in her house harassing her dogs.  Pt also reported not feeling safe at her home because she did not know this man and he keeps coming inside.  Police called due to unknown person.  Police familiar with the patient and her residence and states that the man is her husband and he lives there.  Husband states that pt has been off of her meds for quite some time now and threw her insulin in the yard and refuses to take her psych meds.  Pt states that she just does not feel well.

## 2015-08-07 NOTE — ED Notes (Signed)
Pt's husband Shakisha Herran states if any info needed please call [phone].

## 2015-08-07 NOTE — ED Provider Notes (Signed)
CSN: [id number]     Arrival date & time 08/07/15  1837 History   First MD Initiated Contact with Patient 08/07/15 1849     Chief Complaint  Patient presents with  . V70.1      The history is provided by the patient, the spouse and the EMS personnel. The history is limited by the condition of the patient (psychosis).  Pt was seen at 1900.  Per EMS and pt's husband: EMS states pt called them to residence because there was "a man in her house harassing her dogs," and "not feeling safe because she did not know this man and he kept coming inside." Police were called. Police know pt and told EMS "the man" was her husband and lives there. Pt's husband told EMS pt has been off her meds "for some time now," and pt "refuses to take her psych meds." Pt also "threw her medicines in the yard." On arrival to ED, pt states she is "just here because my chest hurts" and denies above.    Past Medical History  Diagnosis Date  . Bipolar 1 disorder ([place])     pt denies, says was misdiagnosed  . Diabetes mellitus   . Fibromyalgia   . Anxiety   . Panic attack   . Chronic back pain   . Chronic chest wall pain   . Chronic right shoulder pain   . Schizoaffective disorder ([place])   . Psychotic disorder   . CHF (congestive heart failure) [facility])    Past Surgical History  Procedure Laterality Date  . Mandible surgery    . Cholecystectomy     Family History  Problem Relation Age of Onset  . Heart failure Mother   . Heart failure Other    Social History  Substance Use Topics  . Smoking status: Current Every Day Smoker -- 1.00 packs/day for 30 years    Types: Cigarettes  . Smokeless tobacco: Never Used  . Alcohol Use: No   OB History    Gravida Para Term Preterm AB TAB SAB Ectopic Multiple Living   4    2  2         Review of Systems  Unable to perform ROS: Psychiatric disorder      Allergies  Cocaine; Procaine hcl; Aripiprazole; Cogentin; Geodon; and Haldol  Home Medications   Prior to  Admission medications   Medication Sig Start Date End Date Taking? Authorizing Provider  albuterol (PROVENTIL HFA;VENTOLIN HFA) 108 (90 BASE) MCG/ACT inhaler Inhale 1-2 puffs into the lungs every 6 (six) hours as needed for wheezing or shortness of breath. 05/08/15   Fransico Meadow, PA-C  glipiZIDE (GLUCOTROL) 10 MG tablet Take 1 tablet (10 mg total) by mouth 2 (two) times daily before a meal. Patient not taking: Reported on 10/19/2014 08/28/14   Kathie Dike, MD  insulin glargine (LANTUS) 100 unit/mL SOPN Inject 0.1 mLs (10 Units total) into the skin daily. Patient not taking: Reported on 10/19/2014 08/28/14   Kathie Dike, MD  metFORMIN (GLUCOPHAGE) 1000 MG tablet Take 1 tablet (1,000 mg total) by mouth 2 (two) times daily with a meal. 05/08/15   Fransico Meadow, PA-C  methocarbamol (ROBAXIN) 500 MG tablet Take 1 tablet (500 mg total) by mouth 2 (two) times daily. 05/08/15   Fransico Meadow, PA-C  sulfamethoxazole-trimethoprim (BACTRIM DS,SEPTRA DS) 800-160 MG per tablet Take 1 tablet by mouth every 12 (twelve) hours. Patient not taking: Reported on 10/19/2014 08/28/14   Kathie Dike, MD   BP 115/81  mmHg  Pulse 84  Temp(Src) 98.2 F (36.8 C) (Oral)  Resp 18  Ht 5\' 4"  (1.626 m)  Wt 270 lb (122.471 kg)  BMI 46.32 kg/m2  SpO2 99%  LMP 01/24/2013 Physical Exam  1905: Physical examination:  Nursing notes reviewed; Vital signs and O2 SAT reviewed;  Constitutional: Well developed, Well nourished, Well hydrated, In no acute distress; Head:  Normocephalic, atraumatic; Eyes: EOMI, PERRL, No scleral icterus; ENMT: Mouth and pharynx normal, Mucous membranes moist; Neck: Supple, Full range of motion, No lymphadenopathy; Cardiovascular: Regular rate and rhythm, No gallop; Respiratory: Breath sounds clear & equal bilaterally, No rales, rhonchi, wheezes.  Speaking full sentences with ease, Normal respiratory effort/excursion; Chest: +bilat parasternal and anterior chest wall tender to palp. No deformity, no  soft tissue crepitus, no rash. Movement normal; Abdomen: Soft, Nontender, Nondistended, Normal bowel sounds; Genitourinary: No CVA tenderness; Extremities: Pulses normal, No tenderness, No edema, No calf edema or asymmetry.; Neuro: Awake, alert, vague historian. Major CN grossly intact.  Speech clear. No gross focal motor deficits in extremities.; Skin: Color normal, Warm, Dry.; Psych:  Affect flat, poor eye contact. Rambling speech, tangential, flight of ideas.     ED Course  Procedures (including critical care time)  Labs Review Imaging Review  I have personally reviewed and evaluated these images and lab results as part of my medical decision-making.   EKG Interpretation None      MDM  MDM Reviewed: previous chart, nursing note and vitals Reviewed previous: labs and ECG Interpretation: labs and ECG      ED ECG REPORT   Date: 08/07/2015  Rate: 88  Rhythm: sinus arrhythmia  QRS Axis: normal  Intervals: normal  ST/T Wave abnormalities: normal  Conduction Disutrbances:none  Narrative Interpretation:   Old EKG Reviewed: unchanged; no significant changes from previous EKG dated 02/18/2015.    Results for orders placed or performed during the hospital encounter of 08/07/15  Comprehensive metabolic panel  Result Value Ref Range   Sodium 137 135 - 145 mmol/L   Potassium 3.3 (L) 3.5 - 5.1 mmol/L   Chloride 104 101 - 111 mmol/L   CO2 26 22 - 32 mmol/L   Glucose, Bld 141 (H) 65 - 99 mg/dL   BUN 6 6 - 20 mg/dL   Creatinine, Ser 0.74 0.44 - 1.00 mg/dL   Calcium 9.1 8.9 - 10.3 mg/dL   Total Protein 7.2 6.5 - 8.1 g/dL   Albumin 3.4 (L) 3.5 - 5.0 g/dL   AST 38 15 - 41 U/L   ALT 28 14 - 54 U/L   Alkaline Phosphatase 96 38 - 126 U/L   Total Bilirubin 0.8 0.3 - 1.2 mg/dL   GFR calc non Af Amer >60 >60 mL/min   GFR calc Af Amer >60 >60 mL/min   Anion gap 7 5 - 15  Ethanol  Result Value Ref Range   Alcohol, Ethyl (B) <5 <5 mg/dL  Salicylate level  Result Value Ref Range    Salicylate Lvl 123456 2.8 - 30.0 mg/dL  Acetaminophen level  Result Value Ref Range   Acetaminophen (Tylenol), Serum <10 (L) 10 - 30 ug/mL  CBC with Differential  Result Value Ref Range   WBC 6.3 4.0 - 10.5 K/uL   RBC 4.21 3.87 - 5.11 MIL/uL   Hemoglobin 13.9 12.0 - 15.0 g/dL   HCT 40.0 36.0 - 46.0 %   MCV 95.0 78.0 - 100.0 fL   MCH 33.0 26.0 - 34.0 pg   MCHC 34.8 30.0 - 36.0  g/dL   RDW 12.9 11.5 - 15.5 %   Platelets 137 (L) 150 - 400 K/uL   Neutrophils Relative % 76 %   Neutro Abs 4.8 1.7 - 7.7 K/uL   Lymphocytes Relative 18 %   Lymphs Abs 1.1 0.7 - 4.0 K/uL   Monocytes Relative 5 %   Monocytes Absolute 0.3 0.1 - 1.0 K/uL   Eosinophils Relative 1 %   Eosinophils Absolute 0.0 0.0 - 0.7 K/uL   Basophils Relative 0 %   Basophils Absolute 0.0 0.0 - 0.1 K/uL  Urinalysis, Routine w reflex microscopic  Result Value Ref Range   Color, Urine STRAW (A) YELLOW   APPearance CLEAR CLEAR   Specific Gravity, Urine <1.005 (L) 1.005 - 1.030   pH 5.5 5.0 - 8.0   Glucose, UA NEGATIVE NEGATIVE mg/dL   Hgb urine dipstick NEGATIVE NEGATIVE   Bilirubin Urine NEGATIVE NEGATIVE   Ketones, ur NEGATIVE NEGATIVE mg/dL   Protein, ur NEGATIVE NEGATIVE mg/dL   Nitrite NEGATIVE NEGATIVE   Leukocytes, UA NEGATIVE NEGATIVE  Urine rapid drug screen (hosp performed)  Result Value Ref Range   Opiates NONE DETECTED NONE DETECTED   Cocaine NONE DETECTED NONE DETECTED   Benzodiazepines NONE DETECTED NONE DETECTED   Amphetamines NONE DETECTED NONE DETECTED   Tetrahydrocannabinol POSITIVE (A) NONE DETECTED   Barbiturates NONE DETECTED NONE DETECTED  Pregnancy, urine  Result Value Ref Range   Preg Test, Ur NEGATIVE NEGATIVE  Troponin I  Result Value Ref Range   Troponin I <0.03 <0.031 ng/mL  CBG monitoring, ED  Result Value Ref Range   Glucose-Capillary 140 (H) 65 - 99 mg/dL    2055:  TTS eval pending.   2340:  TTS eval continues pending. Holding orders written.    Francine Graven, DO 08/07/15  2341

## 2015-08-07 NOTE — ED Notes (Signed)
Pt reporting CP and SOB. Per EMS, initial call was cardiac related.

## 2015-08-08 ENCOUNTER — Inpatient Hospital Stay (HOSPITAL_COMMUNITY): Admission: EM | Admit: 2015-08-08 | Payer: Medicaid Other | Source: Intra-hospital | Admitting: Psychiatry

## 2015-08-08 LAB — CBG MONITORING, ED
GLUCOSE-CAPILLARY: 95 mg/dL (ref 65–99)
GLUCOSE-CAPILLARY: 56 mg/dL — AB (ref 65–99)
GLUCOSE-CAPILLARY: 81 mg/dL (ref 65–99)

## 2015-08-08 MED ORDER — INSULIN GLARGINE 100 UNIT/ML ~~LOC~~ SOLN
10.0000 [IU] | Freq: Every day | SUBCUTANEOUS | Status: DC
  Administered 2015-08-08: 10 [IU] via SUBCUTANEOUS
  Filled 2015-08-08 (×2): qty 0.1

## 2015-08-08 NOTE — ED Notes (Signed)
EDP waiting for [facility] to put disposition note in chart

## 2015-08-08 NOTE — ED Notes (Signed)
Called [facility] for transportation to go to [facility] after 0830.

## 2015-08-08 NOTE — ED Notes (Signed)
Called to room.  Pt states she is going to leave ,  Her husband is coming to pick her up.  States we have to reason to hold her here against  Her will
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
## 2015-08-08 NOTE — ED Notes (Signed)
Attempted to call report, no answer

## 2015-08-08 NOTE — ED Notes (Signed)
CBG 56.  Crackers and peanut butter given.

## 2015-08-08 NOTE — ED Notes (Signed)
Report given to [facility] at [facility]

## 2015-08-08 NOTE — ED Notes (Signed)
Spoke with husband on the telephone.  States he is good with his wife coming home and he will come pick her up.  Asked husband about home medications.  He states that she threw her insulin away.  And she flushed her bipolar meds and diabetic meds down the toilet.  Also is out of the albuteral  Inhaler.  Husband is also requesting a medication to help her calm down.

## 2015-08-08 NOTE — ED Notes (Signed)
Charge nurse spoke with meagan at [facility].  Pt to be retelepsyched.

## 2015-08-08 NOTE — ED Provider Notes (Signed)
Psych has re-evaluated pt today: states pt does not meet inpt criteria at this time and requests to d/c pt with outpt resources, pt is no longer psychotic, no SI, no HI; states pt "smoking a lot of weed last night" which may have affected her behavior. Pt's husband wishes to take pt home now. Will d/c stable.   Francine Graven, DO 08/08/15 1724

## 2015-08-08 NOTE — ED Notes (Signed)
Pt c/o chest pain; pt placed on cardiac monitor and ekg performed and given to EDP; EDP made aware and in to see pt

## 2015-08-08 NOTE — ED Notes (Signed)
Charge nurse to call back to get report
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
## 2015-08-08 NOTE — ED Provider Notes (Addendum)
5:50 AM  D/w Faylene Kurtz with TTS who has evaluated patient and discussed with physician extender. They feel patient is psychotic, delusional and meets criteria for inpatient psychiatric treatment. No beds currently available at behavioral health [facility]. They will seek placement.  [place], DO 08/08/15 [id number]   6:10 AM  Pt not complaining of sore, burning right-sided chest pain without radiation. No shortness of breath. States she's had this for "years". Pain is completely reproducible with palpation of her chest wall. She states she's been told in the past that this was musculoskeletal. No left-sided chest pain, chest tightness. No shortness of breath. No diaphoresis. No nausea or vomiting. Hemodynamically stable. Repeat EKG shows no ischemic changes. Discussed with Stanton Kidney with TTS who now states that the patient does have a bed available at behavioral health Hospital. Accepting physician is Dr. Shea Evans.  [place], DO 08/08/15 [id number]    EKG Interpretation  Date/Time:  Wednesday August 08 2015 06:09:53 EST Ventricular Rate:  52 PR Interval:  153 QRS Duration: 109 QT Interval:  455 QTC Calculation: 423 R Axis:   53 Text Interpretation:  Sinus rhythm Atrial premature complex Low voltage, precordial leads No significant change since last tracing Confirmed by Kyomi Hector,  DO, Makale Pindell [phone]) on 08/08/2015 6:13:34 AM        [place], DO 08/08/15 [id number]

## 2015-08-08 NOTE — Discharge Instructions (Signed)
°Emergency Department Resource Guide °1) Find a Doctor and Pay Out of Pocket °Although you won't have to find out who is covered by your insurance plan, it is a good idea to ask around and get recommendations. You will then need to call the office and see if the doctor you have chosen will accept you as a new patient and what types of options they offer for patients who are self-pay. Some doctors offer discounts or will set up payment plans for their patients who do not have insurance, but you will need to ask so you aren't surprised when you get to your appointment. ° °2) Contact Your Local Health Department °Not all health departments have doctors that can see patients for sick visits, but many do, so it is worth a call to see if yours does. If you don't know where your local health department is, you can check in your phone book. The CDC also has a tool to help you locate your state's health department, and many state websites also have listings of all of their local health departments. ° °3) Find a Walk-in Clinic °If your illness is not likely to be very severe or complicated, you may want to try a walk in clinic. These are popping up all over the country in pharmacies, drugstores, and shopping centers. They're usually staffed by nurse practitioners or physician assistants that have been trained to treat common illnesses and complaints. They're usually fairly quick and inexpensive. However, if you have serious medical issues or chronic medical problems, these are probably not your best option. ° °No Primary Care Doctor: °- Call Health Connect at  832-8000 - they can help you locate a primary care doctor that  accepts your insurance, provides certain services, etc. °- Physician Referral Service- 1-800-533-3463 ° °Chronic Pain Problems: °Organization         Address  Phone   Notes  °[facility]  (336) 297-2271 Patients need to be referred by their primary care doctor.  ° °Medication  Assistance: °Organization         Address  Phone   Notes  °[place] [facility] [address] °[place], [place] [address] (336) 641-8030 --Must be a resident of [place] °-- Must have NO insurance coverage whatsoever (no Medicaid/ Medicare, etc.) °-- The pt. MUST have a primary care doctor that directs their care regularly and follows them in the community °  °[facility]  (866) 331-1348   °[employer]  (888) 892-1162   ° °Agencies that provide inexpensive medical care: °Organization         Address  Phone   Notes  °[facility]  (336) 832-8035   °[facility]    (336) 832-7272   °[facility] [address] °[place], [place] [address] (336) 832-4777   °[facility] [address], °[place] (336) 271-4999   °[facility]    (336) 373-0678   °[facility]    (336) 272-1050   °[facility] ° [address], [place] Phone:  (336) 832-4444, Fax:  (336) 832-4440 Hours of Operation:  9 am - 6 pm, M-F.  Also accepts Medicaid/Medicare and self-pay.  °[facility] ° [address], [place] Phone: (336) 832-3150, Fax: (336) [phone]. Hours of Operation:  8:30 am - 5:30 pm, M-F.  Also accepts Medicaid and self-pay.  °[facility] [place] [address], [place] Phone: (336) 878-6027   °[facility] [address], [place], [place] (336)723-1848, Ext. 123 Mondays & Thursdays: 7-9 AM.  First 15 patients are seen on a first come, first serve basis. °  ° °Medicaid-accepting [place] Providers: ° °Organization         Address  Phone   Notes  °[facility] [address], [place] (336) 641-2100 Also accepts self-pay patients.  °[facility] [address], [place] ° (336) 856-9996   °[facility] [address], [place]  (336) 288-8857   °[facility] [address] [place] [address], [place] (336) 299-7000   °Veita Bland [address], [place]  ° (336) 373-1557 Only accepts [place] Access Medicaid patients after they have their name applied to their card.  ° °Self-Pay (no insurance) in [place]: ° °Organization         Address  Phone   Notes  °Sickle Cell Patients, [facility] [address], [place] (336) 832-1970   °[facility] [address], [place] (336) 832-4400   °[facility] [place] ° [address], [place] (336) 992-4800   °[facility]/Dr. Osei-Bonsu ° [address] [place] [address], [place] or [address], [place] (336) 841-8500 Phone number for both [place] and [place] locations is the same.  °[facility] [address], [place] (336) 299-0000   °[facility] [address] [place] [address], [place] or [address] (336) 852-7530 °(336) 878-2260   °[facility] [address], [place] (336) 350-1642, phone; (336) 294-5005, fax Sees patients 1st and 3rd Saturday of every month.  Must not qualify for public or private insurance (i.e. Medicaid, Medicare, [place] Health Choice, Veterans' Benefits) • Household income should be no more than 200% of the poverty level •The clinic cannot treat you if you are pregnant or think you are pregnant • Sexually transmitted diseases are not treated at the clinic.  ° ° °Dental Care: °Organization         Address  Phone  Notes  °[place] [facility] [address], [place] (336) 641-6152 Accepts children up to age 21 who are enrolled in Medicaid or [place] Health Choice; pregnant women with a Medicaid card; and children who have applied for Medicaid or [place] Health Choice, but were declined, whose parents can pay a reduced fee at time of service.  °[place]  [facility] [place]  [address], [place] (336) 641-7733 Accepts children up to age 21 who are enrolled in Medicaid or [place] Health Choice; pregnant women with a Medicaid card; and children who have applied for Medicaid or [place] Health Choice, but were declined, whose parents can pay a reduced fee at time of service.  °[facility] ° [address], [place] (336) 641-4533 Patients are seen by appointment only. Walk-ins are not accepted. [facility] will see patients 18 years of age and older. °Monday - Tuesday (8am-5pm) °Most Wednesdays (8:30-5pm) °$30 per visit, cash only  °[facility] ° [address], [place] (336) 641-4533 Patients are seen by appointment only. Walk-ins are not accepted. [facility] will see patients 18 years of age and older. °One   Wednesday Evening (Monthly: Volunteer Based).  $30 per visit, cash only  °[facility]  (919) 537-3737 for adults; Children under age 4, call Graduate Pediatric Dentistry at (919) [phone]. Children aged 4-14, please call (919) [phone] to request a pediatric application. ° Dental services are provided in all areas of dental care including fillings, crowns and bridges, complete and partial dentures, implants, gum treatment, root canals, and extractions. Preventive care is also provided. Treatment is provided to both adults and children. °Patients are selected via a lottery and there is often a waiting list. °  °[facility] [address], °[place] ° (336) 763-8833 [email or web address].drcivils.[email or web address] °  °[facility] [address], [place], [place] (336)723-1848, Ext. 123 Second and Fourth Thursday of each month, opens at 6:30 AM; Clinic ends at 9 AM.  Patients are seen on a first-come first-served basis, and a limited number are seen during each clinic.  ° °[facility] ° [address], [place], [place] (336) 723-7904    Eligibility Requirements °You must have lived in [place], [place], or [place] counties for at least the last three months. °  You cannot be eligible for state or federal sponsored healthcare insurance, including Veterans Administration, Medicaid, or Medicare. °  You generally cannot be eligible for healthcare insurance through your employer.  °  How to apply: °Eligibility screenings are held every Tuesday and Wednesday afternoon from 1:00 pm until 4:00 pm. You do not need an appointment for the interview!  °[facility] [address], [place], [place] 336-631-2330   °[facility]  336-342-8273   °[facility]  336-703-3100   °[facility]  336-570-6415   ° °Behavioral Health Resources in the Community: °Intensive Outpatient Programs °Organization         Address  Phone  Notes  °[facility] [address], [place], [place] 336-878-6098   °[facility] [address], [place], [place] 336-832-9800   °[facility] [address], [place], [place] ° 336-882-2125   °[facility] [address],  °[place], [place] 1-800-853-5163 or 336-641-4981   °Substance Abuse Resources °Organization         Address  Phone  Notes  °[facility]  336-882-2125   °[facility]  336-784-9470   °[facility]  336-285-9073   °[facility]  336-845-3988   °Residential & Outpatient Substance Abuse Program  1-800-659-3381   °Psychological Services °Organization         Address  Phone  Notes  °[facility]  336- 832-9600   °[facility]  336- 378-7881   °[facility] [address], [place] 1-800-853-5163 or 336-641-4981   ° °Mobile Crisis Teams °Organization         Address  Phone  Notes  °[facility], Mobile Crisis Care Unit  1-877-626-1772   °Assertive °[facility] ° [address]  [place], [place] 336-834-9664   °Sharon DeEsch [address] °[place] 336-554-5454   ° °Self-Help/Support Groups °Organization         Address  Phone             Notes  °[facility] [place] - variety of support groups  336- 373-1402 Call for more information  °Narcotics Anonymous (NA), [facility] [address], °[place]  2 meetings at this location  ° °  Residential Treatment Programs Organization         Address  Phone  Notes  [facility] [address]    [place]  1-(712)536-5189   [facility]  [address], [place] [id number], [place], [place]   [place], [place] [phone] Admissions: 8am-3pm M-F  [facility] [place] [address],    [place], [place] [id number]   [facility] [address] [place], [place], [place]   [facility] [address]  [place], [place]   [facility] - Intensive Outpatient [address], [place], [place]   [facility] [place]  [place], [place] 1-(504)384-6906 or [phone]   [facility] [address], [place], [place] Accepts Medicaid  [facility] [address]  [place] 1-314-428-7636 Substance Abuse/Addiction Treatment   [facility] Organization         Address  Phone  Notes  [facility]  [phone]   Domenic Schwab, PhD [address] [place], [place]   787-016-0628 or 929-360-9914   [facility] [place], [place] [phone]   [facility] [address] [place], [place] 704-008-3910 Insurance/Medicaid/sponsorship through [facility] and Families [address] [place]                                    [place], [place] [phone] [place] [address], [place] [phone]    Dr. Adele Schilder  ([phone]   [facility] Dept. 1) [address], [place] 2) [place] 3)  [address], [place] [phone]  [phone]   [place] [phone] or [phone] (After Hours)      Take your usual prescriptions as previously directed.  Call your regular medical doctor tomorrow to schedule a follow up appointment within the next 2 days to review your medications. Call the mental health resources given to you today to schedule a follow up appointment within the next week.  Return to the Emergency Department immediately sooner if worsening.

## 2015-08-08 NOTE — BHH Counselor (Signed)
Per Inocencio Homes, AC: Pt accepted to [facility] and assigned bed 501-2, attending Dr. Shea Evans.  Called Dr. Leonides Schanz to advise.  Faylene Kurtz, MS, CRC, [place] Triage Specialist [facility]

## 2016-10-06 IMAGING — DX DG CHEST 2V
2 series · 2 of 2 positions shown · non-contrast
Comparison: PA and lateral chest 01/24/2014 and 10/29/2013.

CLINICAL DATA: Chest pain beginning today.

EXAM:
CHEST  2 VIEW

[chest pa]
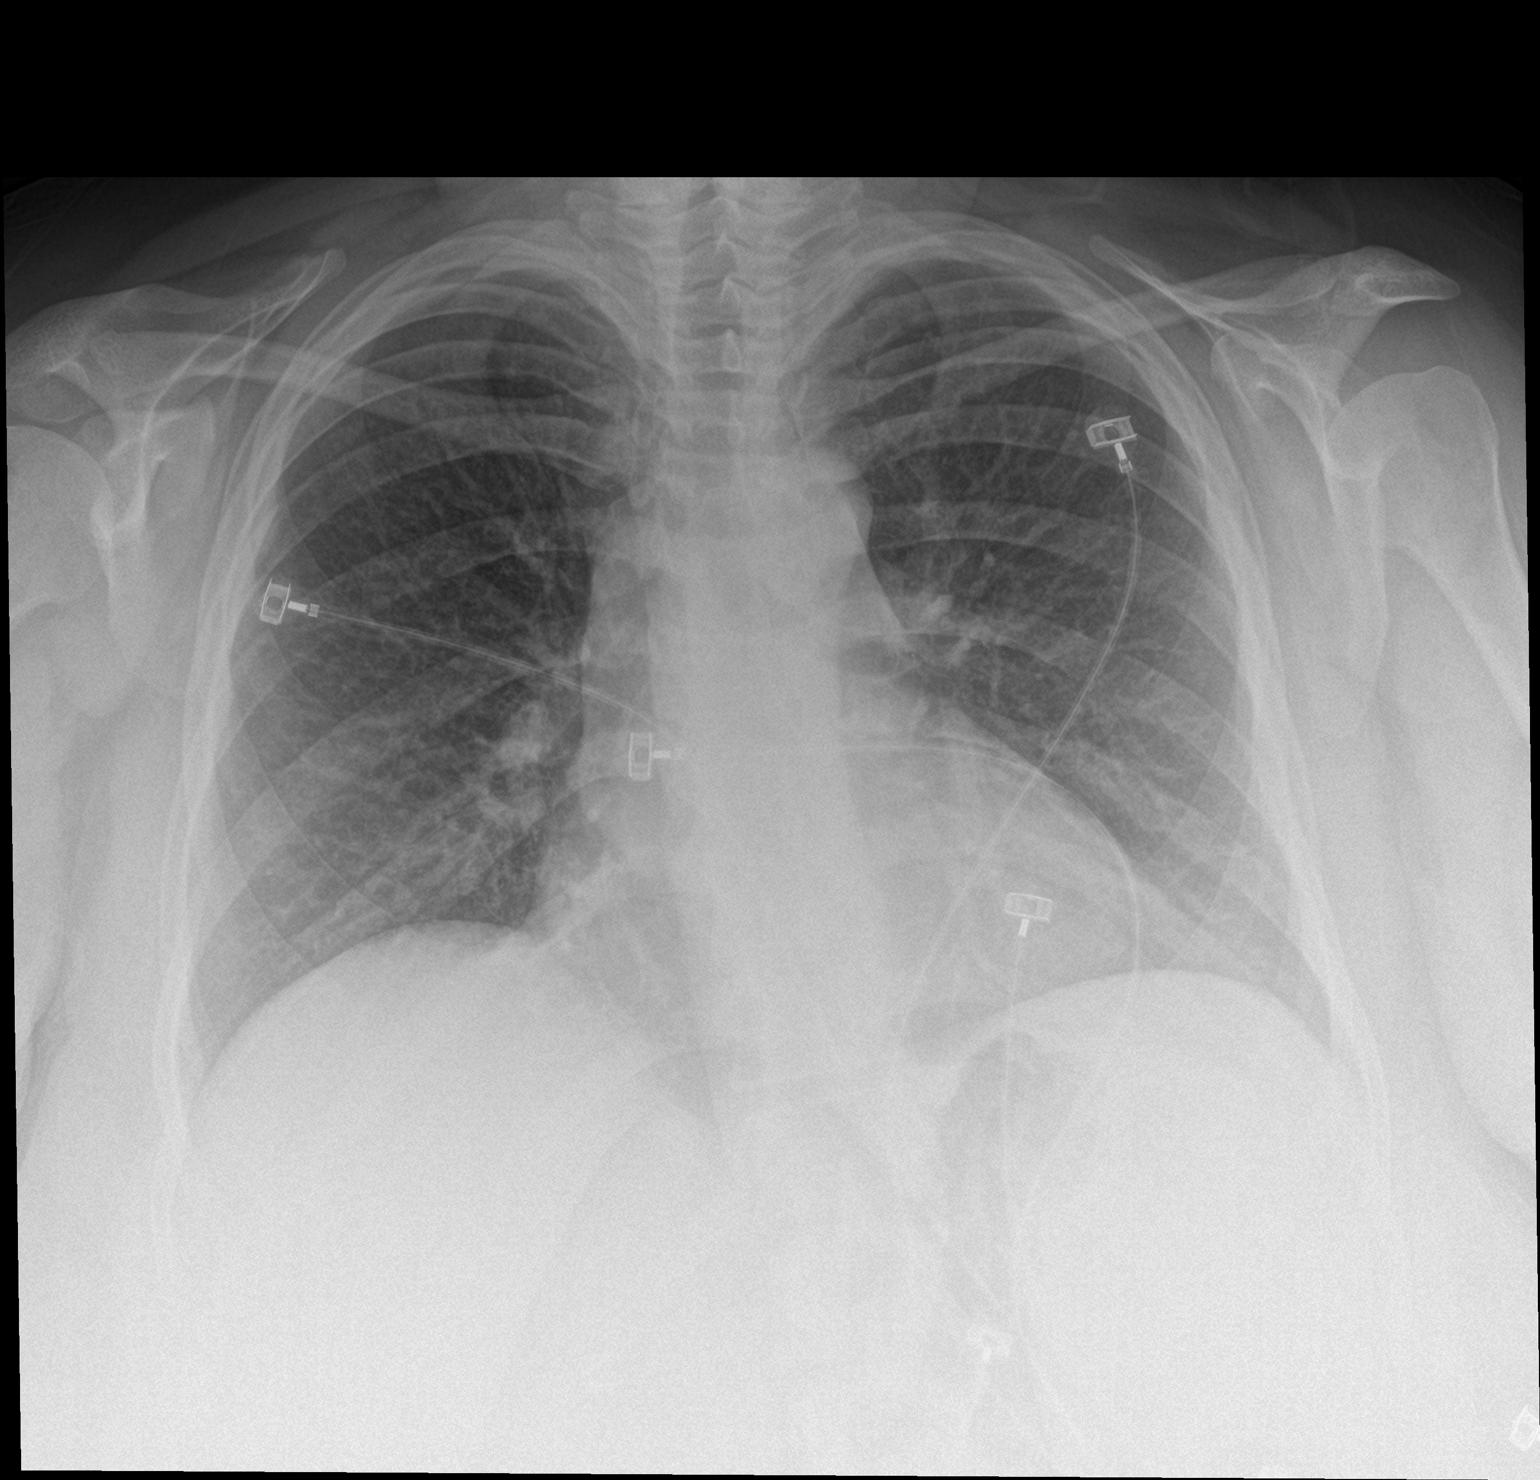

[chest lat]
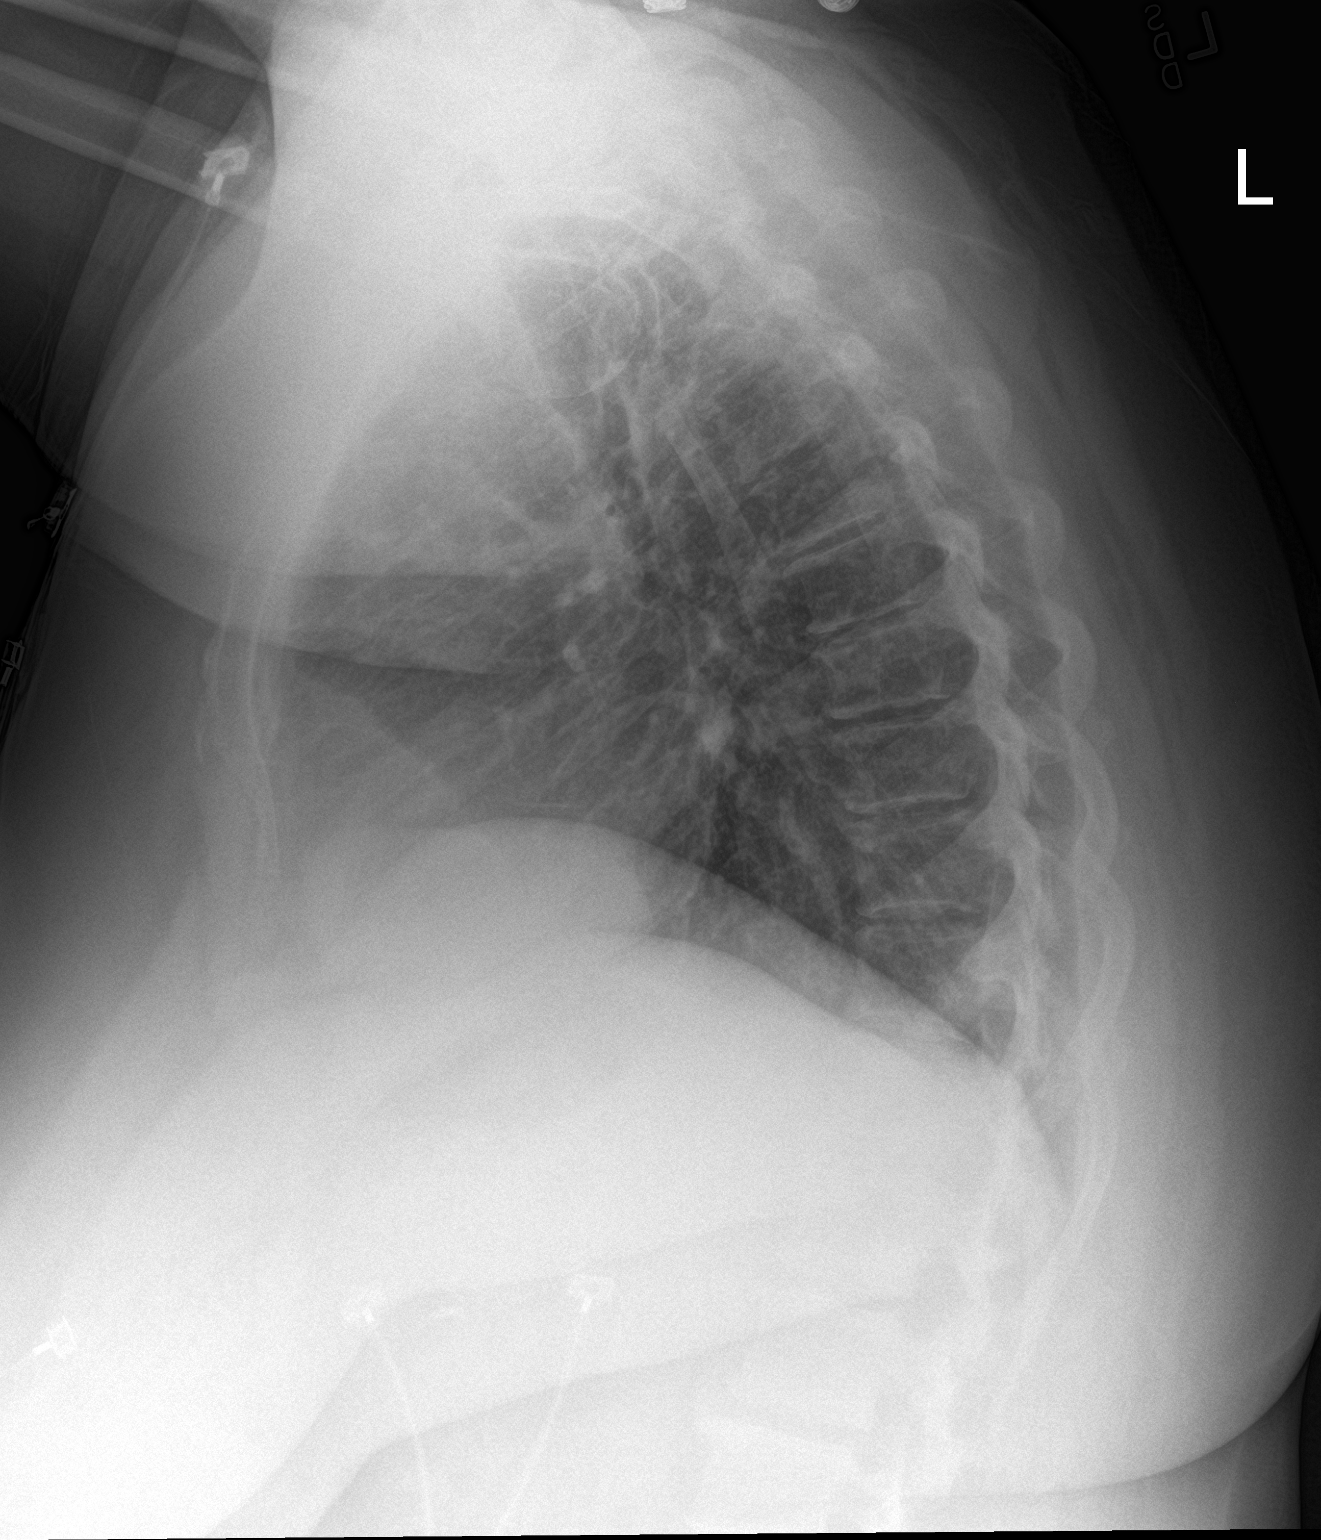

[2 of 2 positions shown; findings below may reference images not displayed]

FINDINGS: The lungs are clear. Heart size is normal. No pneumothorax or
pleural effusion. No focal bony abnormality.
IMPRESSION: Negative chest.

## 2017-11-15 ENCOUNTER — Other Ambulatory Visit: Payer: Self-pay

## 2017-11-15 ENCOUNTER — Encounter (HOSPITAL_COMMUNITY): Payer: Self-pay | Admitting: Emergency Medicine

## 2017-11-15 ENCOUNTER — Emergency Department (HOSPITAL_COMMUNITY): Payer: Medicaid Other

## 2017-11-15 ENCOUNTER — Emergency Department (HOSPITAL_COMMUNITY)
Admission: EM | Admit: 2017-11-15 | Discharge: 2017-11-15 | Disposition: A | Discharge status: Home / Self Care | Payer: Medicaid Other | Attending: Emergency Medicine | Admitting: Emergency Medicine

## 2017-11-15 DIAGNOSIS — J45909 Unspecified asthma, uncomplicated: Secondary | ICD-10-CM | POA: Insufficient documentation

## 2017-11-15 DIAGNOSIS — I110 Hypertensive heart disease with heart failure: Secondary | ICD-10-CM | POA: Insufficient documentation

## 2017-11-15 DIAGNOSIS — E119 Type 2 diabetes mellitus without complications: Secondary | ICD-10-CM | POA: Diagnosis not present

## 2017-11-15 DIAGNOSIS — D696 Thrombocytopenia, unspecified: Secondary | ICD-10-CM | POA: Diagnosis not present

## 2017-11-15 DIAGNOSIS — E876 Hypokalemia: Secondary | ICD-10-CM | POA: Insufficient documentation

## 2017-11-15 DIAGNOSIS — F17210 Nicotine dependence, cigarettes, uncomplicated: Secondary | ICD-10-CM | POA: Diagnosis not present

## 2017-11-15 DIAGNOSIS — I509 Heart failure, unspecified: Secondary | ICD-10-CM | POA: Diagnosis not present

## 2017-11-15 DIAGNOSIS — F22 Delusional disorders: Secondary | ICD-10-CM | POA: Diagnosis not present

## 2017-11-15 DIAGNOSIS — R0789 Other chest pain: Secondary | ICD-10-CM | POA: Diagnosis not present

## 2017-11-15 DIAGNOSIS — R0602 Shortness of breath: Secondary | ICD-10-CM | POA: Diagnosis not present

## 2017-11-15 LAB — CBC
HCT: 38.5 % (ref 36.0–46.0)
Hemoglobin: 13.5 g/dL (ref 12.0–15.0)
MCH: 34 pg (ref 26.0–34.0)
MCHC: 35.1 g/dL (ref 30.0–36.0)
MCV: 97 fL (ref 78.0–100.0)
PLATELETS: 82 10*3/uL — AB (ref 150–400)
RBC: 3.97 MIL/uL (ref 3.87–5.11)
RDW: 13.2 % (ref 11.5–15.5)
WBC: 3 10*3/uL — ABNORMAL LOW (ref 4.0–10.5)

## 2017-11-15 LAB — BASIC METABOLIC PANEL
Anion gap: 9 (ref 5–15)
CHLORIDE: 105 mmol/L (ref 101–111)
CO2: 24 mmol/L (ref 22–32)
CREATININE: 0.62 mg/dL (ref 0.44–1.00)
Calcium: 8.6 mg/dL — ABNORMAL LOW (ref 8.9–10.3)
GFR calc Af Amer: >60 mL/min (ref >60)
GFR calc non Af Amer: >60 mL/min (ref >60)
Glucose, Bld: 112 mg/dL — ABNORMAL HIGH (ref 65–99)
Potassium: 3.1 mmol/L — ABNORMAL LOW (ref 3.5–5.1)
SODIUM: 138 mmol/L (ref 135–145)

## 2017-11-15 LAB — TROPONIN I: Troponin I: <0.03 ng/mL (ref <0.03)

## 2017-11-15 MED ORDER — LORAZEPAM 0.5 MG PO TABS
0.5000 mg | ORAL_TABLET | Freq: Once | ORAL | Status: AC
  Administered 2017-11-15: 0.5 mg via ORAL
  Filled 2017-11-15: qty 1

## 2017-11-15 MED ORDER — KETOROLAC TROMETHAMINE 30 MG/ML IJ SOLN
30.0000 mg | Freq: Once | INTRAMUSCULAR | Status: AC
  Administered 2017-11-15: 30 mg via INTRAVENOUS
  Filled 2017-11-15: qty 1

## 2017-11-15 MED ORDER — POTASSIUM CHLORIDE CRYS ER 20 MEQ PO TBCR
40.0000 meq | EXTENDED_RELEASE_TABLET | Freq: Once | ORAL | Status: AC
  Administered 2017-11-15: 40 meq via ORAL
  Filled 2017-11-15: qty 2
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
## 2017-11-15 NOTE — ED Notes (Signed)
Pt ambulated to [facility]

## 2017-11-15 NOTE — ED Provider Notes (Signed)
[facility] EMERGENCY DEPARTMENT Provider Note   CSN: [id number] Arrival date & time: 11/15/17  1018     History   Chief Complaint Chief Complaint  Patient presents with  . Chest Pain    HPI [name] is a 52 y.o. female.  52 year old female with prior psychiatric disease, CHF diabetes who is been not taking her meds for 8 years presents to the ED with what she describes as heart spasms that have been going on since yesterday.  She states she has had these before and they are usually related to toxins in the air.  There is sometimes associated with shortness of breath.  They come and go and seem to improve when she tries to relax herself.  There is been no fever no cough no abdominal pain vomiting or diarrhea.  She denies any HI SI.  The history is provided by the patient.  Chest Pain   This is a recurrent problem. The current episode started yesterday. Episode frequency: frequent. The problem has not changed since onset.The pain is associated with an emotional upset. The pain is present in the substernal region. The pain is moderate. The quality of the pain is described as stabbing. The pain does not radiate. Associated symptoms include shortness of breath. Pertinent negatives include no abdominal pain, no back pain, no cough, no diaphoresis, no fever, no hemoptysis, no nausea, no palpitations, no syncope and no vomiting. She has tried rest for the symptoms. The treatment provided mild relief.  Her past medical history is significant for anxiety/panic attacks, CHF and diabetes.  Pertinent negatives for past medical history include no seizures.    Past Medical History:  Diagnosis Date  . Anxiety   . Bipolar 1 disorder ([place])    pt denies, says was misdiagnosed  . CHF (congestive heart failure) ([place])   . Chronic back pain   . Chronic chest wall pain   . Chronic right shoulder pain   . Diabetes mellitus   . Fibromyalgia   . Hypertension   . Panic attack   . Psychotic  disorder ([place])   . Schizoaffective disorder [facility])     Patient Active Problem List   Diagnosis Date Noted  . Hyperglycemia without ketosis   . Psychoses ([place])   . Type 2 diabetes mellitus not at goal [facility])   . Psychosis ([place]) 08/25/2014  . Hyperosmolar non-ketotic state in patient with type 2 diabetes mellitus ([place]) 08/25/2014  . Generalized anxiety disorder 01/26/2014  . 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 Psychotic disorder ([place]) 01/25/2014  . Cannabis abuse 11/03/2011  . Schizoaffective disorder, bipolar type ([place]) 11/02/2011  . DM 03/01/2009  . OBESITY, MORBID 03/01/2009  . BIPOLAR DISORDER UNSPECIFIED 03/01/2009  . CIGARETTE SMOKER 03/01/2009  . MARIJUANA ABUSE 03/01/2009  . ASTHMA 03/01/2009  . FIBROMYALGIA 03/01/2009  . DRUG ABUSE, HX OF 03/01/2009    Past Surgical History:  Procedure Laterality Date  . CHOLECYSTECTOMY    . MANDIBLE SURGERY       OB History    Gravida  4   Para      Term      Preterm      AB  2   Living        SAB  2   TAB      Ectopic      Multiple      Live Births               Home Medications  Prior to Admission medications   Medication Sig Start Date End Date Taking? Authorizing Provider  albuterol (PROVENTIL HFA;VENTOLIN HFA) 108 (90 BASE) MCG/ACT inhaler Inhale 1-2 puffs into the lungs every 6 (six) hours as needed for wheezing or shortness of breath. Patient not taking: Reported on 08/07/2015 05/08/15   Fransico Meadow, PA-C  glipiZIDE (GLUCOTROL) 10 MG tablet Take 1 tablet (10 mg total) by mouth 2 (two) times daily before a meal. Patient not taking: Reported on 10/19/2014 08/28/14   Kathie Dike, MD  insulin glargine (LANTUS) 100 unit/mL SOPN Inject 0.1 mLs (10 Units total) into the skin daily. Patient not taking: Reported on 10/19/2014 08/28/14   Kathie Dike, MD  metFORMIN (GLUCOPHAGE) 1000 MG tablet Take 1 tablet (1,000 mg total) by mouth 2 (two) times daily with a meal. Patient not taking:  Reported on 08/07/2015 05/08/15   Fransico Meadow, PA-C  methocarbamol (ROBAXIN) 500 MG tablet Take 1 tablet (500 mg total) by mouth 2 (two) times daily. Patient not taking: Reported on 08/07/2015 05/08/15   Sidney Ace    Family History Family History  Problem Relation Age of Onset  . Heart failure Mother   . Heart failure Other     Social History Social History   Tobacco Use  . Smoking status: Current Every Day Smoker    Packs/day: 1.00    Years: 30.00    Pack years: 30.00    Types: Cigarettes  . Smokeless tobacco: Never Used  Substance Use Topics  . Alcohol use: No  . Drug use: Yes    Types: Marijuana     Allergies   Cocaine; Procaine hcl; Aripiprazole; Cogentin [benztropine mesylate]; Geodon [ziprasidone hcl]; Haldol [haloperidol decanoate]; and Seroquel [quetiapine fumarate]   Review of Systems Review of Systems  Constitutional: Negative for chills, diaphoresis and fever.  HENT: Negative for ear pain and sore throat.   Eyes: Negative for pain and visual disturbance.  Respiratory: Positive for shortness of breath. Negative for cough and hemoptysis.   Cardiovascular: Positive for chest pain. Negative for palpitations and syncope.  Gastrointestinal: Negative for abdominal pain, nausea and vomiting.  Genitourinary: Negative for dysuria and hematuria.  Musculoskeletal: Negative for arthralgias and back pain.  Skin: Negative for color change and rash.  Neurological: Negative for seizures and syncope.  Psychiatric/Behavioral: Negative for suicidal ideas.  All other systems reviewed and are negative.    Physical Exam Updated Vital Signs BP 138/86 (BP Location: Left Arm)   Pulse 80   Temp 97.8 F (36.6 C) (Oral)   Resp 18   Ht 5\' 4"  (1.626 m)   Wt 113.4 kg (250 lb)   LMP 01/24/2013   SpO2 99%   BMI 42.91 kg/m   Physical Exam  Constitutional: She appears well-developed and well-nourished. No distress.  HENT:  Head: Normocephalic and atraumatic.    Eyes: Conjunctivae are normal.  Neck: Neck supple.  Cardiovascular: Normal rate and regular rhythm.  No murmur heard. Pulmonary/Chest: Effort normal and breath sounds normal. No respiratory distress.  Abdominal: Soft. There is no tenderness.  Musculoskeletal: She exhibits no edema.       Right lower leg: Normal. She exhibits no tenderness and no edema.       Left lower leg: Normal. She exhibits no tenderness and no edema.  Neurological: She is alert. GCS eye subscore is 4. GCS verbal subscore is 5. GCS motor subscore is 6.  Skin: Skin is warm and dry. Capillary refill takes less than 2 seconds.  Psychiatric: She has a normal mood and affect. Her speech is normal and behavior is normal. She is not actively hallucinating. Thought content is paranoid. She is attentive.  Nursing note and vitals reviewed.    ED Treatments / Results  Labs (all labs ordered are listed, but only abnormal results are displayed) Labs Reviewed  BASIC METABOLIC PANEL - Abnormal; Notable for the following components:      Result Value   Potassium 3.1 (*)    Glucose, Bld 112 (*)    BUN <5 (*)    Calcium 8.6 (*)    All other components within normal limits  CBC - Abnormal; Notable for the following components:   WBC 3.0 (*)    Platelets 82 (*)    All other components within normal limits  TROPONIN I    EKG EKG Interpretation  Date/Time:  Sunday November 15 2017 11:13:08 EDT Ventricular Rate:  57 PR Interval:    QRS Duration: 107 QT Interval:  494 QTC Calculation: 481 R Axis:   59 Text Interpretation:  Sinus rhythm Atrial premature complex Low voltage, precordial leads similar to prior 1/17 Confirmed by Aletta Edouard [phone]) on 11/15/2017 11:17:57 AM   Radiology Dg Chest 2 View  Result Date: 11/15/2017 CLINICAL DATA:  Pt started having chest pain yesterday, left side weakness of face and body EXAM: CHEST - 2 VIEW COMPARISON:  02/18/2015 FINDINGS: Cardiomediastinal silhouette is normal. There are no  focal consolidations or pleural effusions. No pulmonary edema. IMPRESSION: No active cardiopulmonary disease. Electronically Signed   By: Nolon Nations M.D.   On: 11/15/2017 11:21    Procedures Procedures (including critical care time)  Medications Ordered in ED Medications  LORazepam (ATIVAN) tablet 0.5 mg (0.5 mg Oral Given 11/15/17 1119)  potassium chloride SA (K-DUR,KLOR-CON) CR tablet 40 mEq (40 mEq Oral Given 11/15/17 1225)  ketorolac (TORADOL) 30 MG/ML injection 30 mg (30 mg Intravenous Given 11/15/17 1225)     Initial Impression / Assessment and Plan / ED Course  I have reviewed the triage vital signs and the nursing notes.  Pertinent labs & imaging results that were available during my care of the patient were reviewed by me and considered in my medical decision making (see chart for details).  Clinical Course as of Nov 18 1018  Sun Nov 15, 2017  1307 Found no obvious findings to account for the patient's symptoms.  She is been normal sinus on the monitor with no ectopy and there is no sign of cardiac injury.  Her labs were significant for a slightly low potassium which was orally repleted.  Her white count her platelets are low.  I do not feel she needs hospitalization for further work-up at this and we will give her the number for outpatient clinics.   [MB]    Clinical Course User Index [MB] Hayden Rasmussen, MD     Final Clinical Impressions(s) / ED Diagnoses   Final diagnoses:  Atypical chest pain  Hypokalemia  Thrombocytopenia [facility])    ED Discharge Orders    None       Hayden Rasmussen, MD 11/17/17 1020

## 2017-11-15 NOTE — Discharge Instructions (Addendum)
You were evaluated in the emergency department for a complaint of heart spasms.  We did not find an obvious cause of your symptoms.  There was no evidence of any heart injury or abnormal heart rhythm during your emergency visit.  You will need to follow-up with your primary care doctor for further evaluation.  You should return to the emergency department for any worsening symptoms.

## 2017-11-15 NOTE — ED Triage Notes (Signed)
Pt started having chest pain yesterday.  Pt states she has not seen a doctor in over 8 years.

## 2023-12-30 ENCOUNTER — Encounter (HOSPITAL_COMMUNITY): Payer: Self-pay

## 2023-12-30 ENCOUNTER — Emergency Department (HOSPITAL_COMMUNITY)
Admission: EM | Admit: 2023-12-30 | Discharge: 2023-12-31 | Disposition: A | Discharge status: Other Acute Inpatient Hospital | Attending: Emergency Medicine | Admitting: Emergency Medicine

## 2023-12-30 ENCOUNTER — Other Ambulatory Visit: Payer: Self-pay

## 2023-12-30 DIAGNOSIS — Z794 Long term (current) use of insulin: Secondary | ICD-10-CM | POA: Insufficient documentation

## 2023-12-30 DIAGNOSIS — F29 Unspecified psychosis not due to a substance or known physiological condition: Secondary | ICD-10-CM | POA: Insufficient documentation

## 2023-12-30 DIAGNOSIS — Z7984 Long term (current) use of oral hypoglycemic drugs: Secondary | ICD-10-CM | POA: Insufficient documentation

## 2023-12-30 DIAGNOSIS — I509 Heart failure, unspecified: Secondary | ICD-10-CM | POA: Diagnosis not present

## 2023-12-30 DIAGNOSIS — F22 Delusional disorders: Secondary | ICD-10-CM | POA: Diagnosis not present

## 2023-12-30 DIAGNOSIS — Z79899 Other long term (current) drug therapy: Secondary | ICD-10-CM | POA: Diagnosis not present

## 2023-12-30 DIAGNOSIS — I110 Hypertensive heart disease with heart failure: Secondary | ICD-10-CM | POA: Insufficient documentation

## 2023-12-30 DIAGNOSIS — E119 Type 2 diabetes mellitus without complications: Secondary | ICD-10-CM | POA: Diagnosis not present

## 2023-12-30 LAB — COMPREHENSIVE METABOLIC PANEL WITH GFR
ALT: 27 U/L (ref 0–44)
AST: 37 U/L (ref 15–41)
Albumin: 3.3 g/dL — ABNORMAL LOW (ref 3.5–5.0)
Alkaline Phosphatase: 107 U/L (ref 38–126)
Anion gap: 11 (ref 5–15)
BUN: 5 mg/dL — ABNORMAL LOW (ref 6–20)
CO2: 22 mmol/L (ref 22–32)
Calcium: 8.8 mg/dL — ABNORMAL LOW (ref 8.9–10.3)
Chloride: 105 mmol/L (ref 98–111)
Creatinine, Ser: 0.71 mg/dL (ref 0.44–1.00)
GFR, Estimated: >60 mL/min (ref >60)
Glucose, Bld: 106 mg/dL — ABNORMAL HIGH (ref 70–99)
Potassium: 3.5 mmol/L (ref 3.5–5.1)
Sodium: 138 mmol/L (ref 135–145)
Total Bilirubin: 1 mg/dL (ref 0.0–1.2)
Total Protein: 7.1 g/dL (ref 6.5–8.1)

## 2023-12-30 LAB — URINALYSIS, ROUTINE W REFLEX MICROSCOPIC
Bilirubin Urine: NEGATIVE
Glucose, UA: NEGATIVE mg/dL
Hgb urine dipstick: NEGATIVE
Ketones, ur: NEGATIVE mg/dL
Leukocytes,Ua: NEGATIVE
Nitrite: NEGATIVE
Protein, ur: NEGATIVE mg/dL
Specific Gravity, Urine: 1.001 — ABNORMAL LOW (ref 1.005–1.030)
pH: 7 (ref 5.0–8.0)

## 2023-12-30 LAB — CBC WITH DIFFERENTIAL/PLATELET
Abs Immature Granulocytes: 0.01 10*3/uL (ref 0.00–0.07)
Basophils Absolute: 0 10*3/uL (ref 0.0–0.1)
Basophils Relative: 1 %
Eosinophils Absolute: 0.1 10*3/uL (ref 0.0–0.5)
Eosinophils Relative: 1 %
HCT: 38.2 % (ref 36.0–46.0)
Hemoglobin: 13.4 g/dL (ref 12.0–15.0)
Immature Granulocytes: 0 %
Lymphocytes Relative: 29 %
Lymphs Abs: 1.3 10*3/uL (ref 0.7–4.0)
MCH: 33.6 pg (ref 26.0–34.0)
MCHC: 35.1 g/dL (ref 30.0–36.0)
MCV: 95.7 fL (ref 80.0–100.0)
Monocytes Absolute: 0.3 10*3/uL (ref 0.1–1.0)
Monocytes Relative: 7 %
Neutro Abs: 2.7 10*3/uL (ref 1.7–7.7)
Neutrophils Relative %: 62 %
Platelets: 125 10*3/uL — ABNORMAL LOW (ref 150–400)
RBC: 3.99 MIL/uL (ref 3.87–5.11)
RDW: 12.8 % (ref 11.5–15.5)
WBC: 4.3 10*3/uL (ref 4.0–10.5)
nRBC: 0 % (ref 0.0–0.2)

## 2023-12-30 LAB — RAPID URINE DRUG SCREEN, HOSP PERFORMED
Amphetamines: NOT DETECTED
Barbiturates: NOT DETECTED
Benzodiazepines: NOT DETECTED
Cocaine: NOT DETECTED
Opiates: NOT DETECTED
Tetrahydrocannabinol: NOT DETECTED

## 2023-12-30 LAB — ETHANOL: Alcohol, Ethyl (B): <15 mg/dL (ref <15)

## 2023-12-30 NOTE — ED Provider Notes (Addendum)
 [place] EMERGENCY DEPARTMENT AT [facility] Provider Note   CSN: [id number] Arrival date & time: 12/30/23  2122     History  Chief Complaint  Patient presents with   Psychiatric Evaluation    [name] is a 58 y.o. female.  Pt is a 58 yo female with pmhx significant for bipolar d/o, DM, anxiety, schizoaffective d/o, fibromyalgia, htn, and chf.  Pt was brought here by police because of psychosis.  Pt said she can feel the radioactive trash in her neighborhood and does not want to be poisoned.  She denies any si/hi.       Home Medications Prior to Admission medications   Medication Sig Start Date End Date Taking? Authorizing Provider  albuterol  (PROVENTIL  HFA;VENTOLIN  HFA) 108 (90 BASE) MCG/ACT inhaler Inhale 1-2 puffs into the lungs every 6 (six) hours as needed for wheezing or shortness of breath. Patient not taking: Reported on 08/07/2015 05/08/15   Sofia, Leslie K, PA-C  glipiZIDE  (GLUCOTROL ) 10 MG tablet Take 1 tablet (10 mg total) by mouth 2 (two) times daily before a meal. Patient not taking: Reported on 10/19/2014 08/28/14   Gwendalyn Lemma, MD  insulin  glargine (LANTUS ) 100 unit/mL SOPN Inject 0.1 mLs (10 Units total) into the skin daily. Patient not taking: Reported on 10/19/2014 08/28/14   Gwendalyn Lemma, MD  metFORMIN  (GLUCOPHAGE ) 1000 MG tablet Take 1 tablet (1,000 mg total) by mouth 2 (two) times daily with a meal. Patient not taking: Reported on 08/07/2015 05/08/15   Sandi Crosby, PA-C  methocarbamol  (ROBAXIN ) 500 MG tablet Take 1 tablet (500 mg total) by mouth 2 (two) times daily. Patient not taking: Reported on 08/07/2015 05/08/15   Sofia, Leslie K, PA-C      Allergies    Cocaine, Procaine hcl, Aripiprazole, Cogentin  [benztropine  mesylate], Geodon  [ziprasidone  hcl], Haldol  [haloperidol  decanoate], and Seroquel [quetiapine fumarate]    Review of Systems   Review of Systems  Psychiatric/Behavioral:  The patient is nervous/anxious.         Psychosis  All other systems reviewed and are negative.   Physical Exam Updated Vital Signs BP (!) 160/99   Pulse 90   Temp 98 F (36.7 C) (Oral)   Resp 18   Ht 5\' 4"  (1.626 m)   Wt 113.4 kg   LMP 01/24/2013   SpO2 100%   BMI 42.91 kg/m  Physical Exam Vitals and nursing note reviewed.  Constitutional:      Appearance: Normal appearance.  HENT:     Head: Normocephalic and atraumatic.     Right Ear: External ear normal.     Left Ear: External ear normal.     Nose: Nose normal.     Mouth/Throat:     Mouth: Mucous membranes are moist.     Pharynx: Oropharynx is clear.  Eyes:     Conjunctiva/sclera: Conjunctivae normal.     Pupils: Pupils are equal, round, and reactive to light.  Cardiovascular:     Rate and Rhythm: Normal rate and regular rhythm.     Pulses: Normal pulses.     Heart sounds: Normal heart sounds.  Pulmonary:     Effort: Pulmonary effort is normal.     Breath sounds: Normal breath sounds.  Abdominal:     General: Abdomen is flat. Bowel sounds are normal.     Palpations: Abdomen is soft.  Musculoskeletal:        General: Normal range of motion.     Cervical back: Normal range of motion and  neck supple.  Skin:    General: Skin is warm.     Capillary Refill: Capillary refill takes less than 2 seconds.  Neurological:     General: No focal deficit present.     Mental Status: She is alert and oriented to person, place, and time.  Psychiatric:        Thought Content: Thought content is paranoid.     ED Results / Procedures / Treatments   Labs (all labs ordered are listed, but only abnormal results are displayed) Labs Reviewed  COMPREHENSIVE METABOLIC PANEL WITH GFR - Abnormal; Notable for the following components:      Result Value   Glucose, Bld 106 (*)    BUN 5 (*)    Calcium 8.8 (*)    Albumin 3.3 (*)    All other components within normal limits  CBC WITH DIFFERENTIAL/PLATELET - Abnormal; Notable for the following components:   Platelets 125  (*)    All other components within normal limits  URINALYSIS, ROUTINE W REFLEX MICROSCOPIC - Abnormal; Notable for the following components:   Color, Urine COLORLESS (*)    Specific Gravity, Urine 1.001 (*)    All other components within normal limits  ETHANOL  RAPID URINE DRUG SCREEN, HOSP PERFORMED  HEMOGLOBIN A1C  CBG MONITORING, ED    EKG None  Radiology No results found.  Procedures Procedures    Medications Ordered in ED Medications - No data to display  ED Course/ Medical Decision Making/ A&P                                 Medical Decision Making Amount and/or Complexity of Data Reviewed Labs: ordered.   This patient presents to the ED for concern of psychosis, this involves an extensive number of treatment options, and is a complaint that carries with it a high risk of complications and morbidity.  The differential diagnosis includes electrolyte abn, psych, infection   Co morbidities that complicate the patient evaluation  bipolar d/o, DM, anxiety, schizoaffective d/o, fibromyalgia, htn, and chf   Additional history obtained:  Additional history obtained from epic chart review External records from outside source obtained and reviewed including police   Lab Tests:  I Ordered, and personally interpreted labs.  The pertinent results include:  cbc nl, cmp nl, etoh neg, ua nl; uds neg  Medicines ordered and prescription drug management:  I have reviewed the patients home medicines and have made adjustments as needed  Critical Interventions:  Tts consult   Consultations Obtained:  I requested consultation with TTS,  and discussed lab and imaging findings as well as pertinent plan - consult pending   Problem List / ED Course:  Psychosis:  tts consult pending.  Pt is medically clear.  Psych hold orders placed.  No psych meds on med rec. Hx dm:  bs is normal.  Pt said she's not taking any dm meds.  I am going to hold off on dm meds for now and  monitor bs.  Hgb A1c ordered.  Diabetic diet ordered   Reevaluation:  After the interventions noted above, I reevaluated the patient and found that they have :improved   Social Determinants of Health:  Lives at home   Dispostion:  Pending at shift change    Final Clinical Impression(s) / ED Diagnoses Final diagnoses:  Psychosis, unspecified psychosis type (HCC)    Rx / DC Orders ED Discharge Orders  None         Sueellen Emery, MD 12/30/23 1478    Sueellen Emery, MD 12/30/23 Goble Last    Sueellen Emery, MD 12/30/23 [phone]

## 2023-12-30 NOTE — ED Notes (Signed)
Pt wanded by security in triage. 

## 2023-12-30 NOTE — ED Triage Notes (Signed)
 Pt dropped off by [facility] dept for medical clearance. Pt says she lives with her spouse on [address] And she can "feel the radioactive trash in the neighborhood, it's poison in her blood and she doesn't want to be poisoned. Pt denies SI or HI.

## 2023-12-30 NOTE — ED Notes (Signed)
 Pt attempting to provide urine sample

## 2023-12-31 ENCOUNTER — Inpatient Hospital Stay (HOSPITAL_COMMUNITY)
Admission: AD | Admit: 2023-12-31 | Discharge: 2024-01-07 | DRG: 885 | Disposition: A | Discharge status: Home / Self Care | Source: Intra-hospital | Attending: Psychiatry | Admitting: Psychiatry

## 2023-12-31 ENCOUNTER — Encounter (HOSPITAL_COMMUNITY): Payer: Self-pay | Admitting: Psychiatry

## 2023-12-31 DIAGNOSIS — F332 Major depressive disorder, recurrent severe without psychotic features: Secondary | ICD-10-CM | POA: Diagnosis present

## 2023-12-31 DIAGNOSIS — Z794 Long term (current) use of insulin: Secondary | ICD-10-CM | POA: Diagnosis not present

## 2023-12-31 DIAGNOSIS — M797 Fibromyalgia: Secondary | ICD-10-CM | POA: Diagnosis present

## 2023-12-31 DIAGNOSIS — F17210 Nicotine dependence, cigarettes, uncomplicated: Secondary | ICD-10-CM | POA: Diagnosis present

## 2023-12-31 DIAGNOSIS — I10 Essential (primary) hypertension: Secondary | ICD-10-CM | POA: Diagnosis present

## 2023-12-31 DIAGNOSIS — E119 Type 2 diabetes mellitus without complications: Secondary | ICD-10-CM | POA: Diagnosis present

## 2023-12-31 DIAGNOSIS — F200 Paranoid schizophrenia: Secondary | ICD-10-CM | POA: Diagnosis present

## 2023-12-31 DIAGNOSIS — F250 Schizoaffective disorder, bipolar type: Principal | ICD-10-CM | POA: Diagnosis present

## 2023-12-31 LAB — GLUCOSE, CAPILLARY: Glucose-Capillary: 115 mg/dL — ABNORMAL HIGH (ref 70–99)

## 2023-12-31 LAB — HEMOGLOBIN A1C
Hgb A1c MFr Bld: 4.8 % (ref 4.8–5.6)
Mean Plasma Glucose: 91.06 mg/dL

## 2023-12-31 LAB — CBG MONITORING, ED
Glucose-Capillary: 101 mg/dL — ABNORMAL HIGH (ref 70–99)
Glucose-Capillary: 99 mg/dL (ref 70–99)
Glucose-Capillary: 87 mg/dL (ref 70–99)

## 2023-12-31 MED ORDER — OLANZAPINE 10 MG IM SOLR: 5.0000 mg | Freq: Three times a day (TID) | INTRAMUSCULAR | Status: DC | PRN

## 2023-12-31 MED ORDER — MAGNESIUM HYDROXIDE 400 MG/5ML PO SUSP
30.0000 mL | Freq: Every day | ORAL | Status: DC | PRN
  Administered 2024-01-01: 30 mL via ORAL
  Filled 2023-12-31: qty 30

## 2023-12-31 MED ORDER — INSULIN ASPART 100 UNIT/ML IJ SOLN: 0.0000 [IU] | Freq: Every day | INTRAMUSCULAR | Status: DC

## 2023-12-31 MED ORDER — INSULIN ASPART 100 UNIT/ML IJ SOLN: 0.0000 [IU] | Freq: Three times a day (TID) | INTRAMUSCULAR | Status: DC

## 2023-12-31 MED ORDER — ALUM & MAG HYDROXIDE-SIMETH 200-200-20 MG/5ML PO SUSP: 30.0000 mL | ORAL | Status: DC | PRN

## 2023-12-31 MED ORDER — OLANZAPINE 5 MG PO TBDP: 5.0000 mg | ORAL_TABLET | Freq: Three times a day (TID) | ORAL | Status: DC | PRN

## 2023-12-31 MED ORDER — MELATONIN 5 MG PO TABS
10.0000 mg | ORAL_TABLET | Freq: Every day | ORAL | Status: DC
  Administered 2023-12-31 – 2024-01-06 (×7): 10 mg via ORAL
  Filled 2023-12-31 (×7): qty 2

## 2023-12-31 MED ORDER — OLANZAPINE 10 MG IM SOLR: 10.0000 mg | Freq: Three times a day (TID) | INTRAMUSCULAR | Status: DC | PRN

## 2023-12-31 MED ORDER — MELATONIN 5 MG PO TABS: 5.0000 mg | ORAL_TABLET | Freq: Every evening | ORAL | Status: DC | PRN

## 2023-12-31 NOTE — ED Notes (Signed)
 Pt given warm blanket and ice water per request
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
## 2023-12-31 NOTE — [facility] Group Notes (Signed)
 Adult Psychoeducational Group Note  Date:  12/31/2023 Time:  9:21 PM  Group Topic/Focus:  Wrap-Up Group:   The focus of this group is to help patients review their daily goal of treatment and discuss progress on daily workbooks.  Participation Level:  Active  Participation Quality:  Attentive  Affect:  Appropriate  Cognitive:  Alert  Insight: Improving  Engagement in Group:  Engaged  Modes of Intervention:  Discussion  Additional Comments:  Pt attended and participated in wrap up group.  Thomasine Flick Cassandra 12/31/2023, 9:21 PM

## 2023-12-31 NOTE — BH Assessment (Signed)
 Clinician messaged Arpita B. Ragland, RN: "Hey. It's [name] with TTS. Is the pt able to engage in the assessment, if so the pt will need to be placed in a private room. Also is the pt medically cleared?"    Clinician awaiting response.   Rosi Converse, MS, [facility], [facility] Triage Specialist [phone]

## 2023-12-31 NOTE — ED Notes (Signed)
 Pt off unit to [facility]

## 2023-12-31 NOTE — Tx Team (Signed)
 Initial Treatment Plan 12/31/2023 7:05 PM [name]:[id number]    PATIENT STRESSORS: Health problems   Medication change or noncompliance     PATIENT STRENGTHS: Capable of independent living  Communication skills  Supportive family/friends    PATIENT IDENTIFIED PROBLEMS: Acute Psychosis (paranoia, disorganized).    Medication noncompliance "Dr. Cathlean Co stopped me from taking my medications".    Health Concerns (Diabetes, HTN, CHF, Fibromyalgia)             DISCHARGE CRITERIA:  Improved stabilization in mood, thinking, and/or behavior Verbal commitment to aftercare and medication compliance  PRELIMINARY DISCHARGE PLAN: Outpatient therapy Return to previous living arrangement  PATIENT/FAMILY INVOLVEMENT: This treatment plan has been presented to and reviewed with the patient, [name]. The patient have been given the opportunity to ask questions and make suggestions.  Andrea Cronk, RN 12/31/2023, 7:05 PM

## 2023-12-31 NOTE — Progress Notes (Signed)
 Per [facility], pt has been accepted to [facility] on 12/31/2023 Bed Assignment:  504-1  Pt meets inpatient criteria per:  Bonnita Buttner, NP  Attending Physician will be: Dr. Zouev  Report can be called to: [phone]  Pt can arrive anytime after 4PM  Care Team Notified:  Davia Erps, NP, Avelina Bode, RN    [place], [facility] [phone]

## 2023-12-31 NOTE — ED Notes (Signed)
 [facility] called to transport this patient. Nurse made aware.

## 2023-12-31 NOTE — ED Notes (Addendum)
 Pt given sandwich and water.

## 2023-12-31 NOTE — ED Notes (Signed)
 Pt dressed out into burgundy scrubs, pt robe, shirt, pants, cigarettes in cig case placed in pt labled bag and placed in [facility] locker

## 2024-01-01 ENCOUNTER — Encounter (HOSPITAL_COMMUNITY): Payer: Self-pay

## 2024-01-01 DIAGNOSIS — F250 Schizoaffective disorder, bipolar type: Secondary | ICD-10-CM | POA: Diagnosis not present

## 2024-01-01 LAB — GLUCOSE, CAPILLARY
Glucose-Capillary: 91 mg/dL (ref 70–99)
Glucose-Capillary: 79 mg/dL (ref 70–99)
Glucose-Capillary: 138 mg/dL — ABNORMAL HIGH (ref 70–99)
Glucose-Capillary: 116 mg/dL — ABNORMAL HIGH (ref 70–99)

## 2024-01-01 MED ORDER — IBUPROFEN 600 MG PO TABS
600.0000 mg | ORAL_TABLET | Freq: Once | ORAL | Status: AC | PRN
  Administered 2024-01-01: 600 mg via ORAL
  Filled 2024-01-01: qty 1

## 2024-01-01 NOTE — Progress Notes (Signed)
   01/01/24 2035  Charting Type  Charting Type Shift assessment  Safety Check Verification  Has the RN verified the 15 minute safety check completion? Yes  Neurological  Neuro (WDL) WDL  HEENT  HEENT (WDL) WDL  Respiratory  Respiratory (WDL) WDL  Cardiac  Cardiac (WDL) WDL  Vascular  Vascular (WDL) WDL  Integumentary  Integumentary (WDL) WDL (no changes since admit)  Braden Scale (Ages 8 and up)  Sensory Perceptions 4  Moisture 4  Activity 4  Mobility 4  Nutrition 3  Musculoskeletal  Musculoskeletal (WDL) X (hx of fibromyalgia)  Gastrointestinal  Gastrointestinal (WDL) WDL  GU Assessment  Genitourinary (WDL) WDL  Neurological  Level of Consciousness Alert

## 2024-01-01 NOTE — Progress Notes (Signed)
   12/31/23 2200  Psych Admission Type (Psych Patients Only)  Admission Status Voluntary  Psychosocial Assessment  Patient Complaints Anxiety  Eye Contact Fair  Facial Expression Anxious  Affect Appropriate to circumstance  Speech Tangential;Soft  Interaction Assertive  Motor Activity Fidgety  Appearance/Hygiene Disheveled  Behavior Characteristics Cooperative  Mood Suspicious  Thought Process  Coherency Circumstantial  Content Paranoia;Preoccupation  Delusions Persecutory  Perception WDL  Hallucination None reported or observed  Judgment Impaired  Confusion None  Danger to Self  Current suicidal ideation? Denies  Danger to Others  Danger to Others None reported or observed

## 2024-01-01 NOTE — [facility] Group Notes (Signed)
 Adult Psychoeducational Group Note  Date:  01/01/2024 Time:  10:38 AM  Group Topic/Focus:  Goals Group:   The focus of this group is to help patients establish daily goals to achieve during treatment and discuss how the patient can incorporate goal setting into their daily lives to aide in recovery.  Participation Level:  Did Not Attend  Participation Quality:  na  Affect:  na  Cognitive:  na  Insight: na  Engagement in Group:  na  Modes of Intervention:  na  Additional Comments:  Did not attend group   Willene Holian 01/01/2024, 10:38 AM

## 2024-01-01 NOTE — Group Note (Signed)
 Date:  01/01/2024 Time:  8:42 PM  Group Topic/Focus:  Wrap-Up Group:   The focus of this group is to help patients review their daily goal of treatment and discuss progress on daily workbooks.    Participation Level:  Active  Participation Quality:  Appropriate  Affect:  Appropriate  Cognitive:  Appropriate  Insight: Appropriate  Engagement in Group:  Improving  Modes of Intervention:  Discussion  Additional Comments:  Pt stated her goal for today was to focus on her treatment plan. Pt stated she accomplished her goal today. Pt stated she talked with her doctor and with her social worker about her care today. Pt rated her overall day a  10. Pt stated she was able to contact her husband today which improved her overall day. Pt stated she felt better about herself tonight. Pt stated she was able to attend all meals today. Pt stated she took all medications provided today. Pt stated her appetite was pretty good today. Pt rated her sleep last night was pretty good.  Pt stated the goal tonight was to get some rest. Pt stated she had some physical pain tonight. Pt stated she had some severe pain in her right shoulder. Pt rated the severe pain in her right shoulder a 9 on the pain level scale. Pt nurse was updated on the situation. Pt deny visual hallucinations and auditory issues tonight. Pt denies thoughts of harming herself or others. Pt stated she would alert staff if anything changed  Dwaine Gip 01/01/2024, 8:42 PM

## 2024-01-01 NOTE — Plan of Care (Signed)

## 2024-01-01 NOTE — Progress Notes (Signed)
   01/01/24 2035  Psych Admission Type (Psych Patients Only)  Admission Status Voluntary  Psychosocial Assessment  Patient Complaints Anxiety  Eye Contact Brief  Facial Expression Anxious  Affect Appropriate to circumstance;Preoccupied  Speech Tangential  Interaction Assertive  Motor Activity Slow  Appearance/Hygiene Disheveled  Behavior Characteristics Cooperative  Mood Suspicious;Pleasant  Thought Process  Coherency Circumstantial  Content Paranoia  Delusions Persecutory  Perception WDL  Hallucination None reported or observed  Judgment Impaired  Confusion None  Danger to Self  Current suicidal ideation? Denies  Danger to Others  Danger to Others None reported or observed

## 2024-01-01 NOTE — Group Note (Signed)
 Recreation Therapy Group Note   Group Topic:Healthy Decision Making  Group Date: 01/01/2024 Start Time: 1010 End Time: 1025 Facilitators: Eljay Lave-McCall, LRT,CTRS Location: [address] Dayroom   Group Topic: Communication, Team Building, Problem Solving   Goal Area(s) Addresses:  Patient will effectively work with peer towards shared goal.  Patient will identify skill used to make activity successful.  Patient will identify how skills used during activity can be used to reach post d/c goals.    Behavioral Response:    Intervention: STEM Activity    Activity: In team's, using 20 small plastic cups, patients were asked to build the tallest free standing tower possible.     Education: Pharmacist, community, Building control surveyor.    Education Outcome: Acknowledges education/In group clarification offered/Needs additional education.    Affect/Mood: N/A   Participation Level: Did not attend    Clinical Observations/Individualized Feedback:     Plan: Continue to engage patient in RT group sessions 2-3x/week.   Britni Driscoll-McCall, LRT,CTRS 01/01/2024 12:19 PM

## 2024-01-01 NOTE — H&P (Signed)
 Psychiatric Admission Assessment Adult  Patient Identification: [name]  MRN:  [id number]  Date of Evaluation:  01/01/2024  Chief Complaint:  MDD (major depressive disorder), recurrent episode, severe (HCC) [F33.2]  Principal Diagnosis: Schizoaffective disorder, bipolar type (HCC)  Diagnosis:  Principal Problem:   Schizoaffective disorder, bipolar type (HCC)  History of Present Illness: This is an admission evaluation for this 58 year old Caucasian female with history of schizoaffective disorder and probably substance use disorder. Patient has been admitted/treated for mental health related issues remotely and this [facility]. Chart review has indicated that the last time patient was in this hospital was 2015, almost 10 years ago.  At the time, she was treated for schizoaffective disorder.  At the time patient was discharged on BuSpar  10 mg, Tegretol  XR 200 mg, olanzapine  5 mg and trazodone  50 mg.  Patient has no other record of psychiatric hospitalizations since 2015.  However, she is being admitted to this [facility] this time from the [facility] in [place] .  Chart review indicated that patient was a police drop-off with complaint of psychosis as she was feeling like the radioactive trash in her neighborhood may be poisoning her. A review of her current lab results has shown basically normal levels. However, will obtain, lipid panel.  Her toxicology & UDS results were clear.  During this evaluation, patient reports,   "The [place] police took to the [facility] two nights ago. I called them because I had an allergic reaction to a chemical called anti-toxins. When I had it, I had a hard time breathing. The toxins got into my system through my breathing. You can't see the toxins because they are in the air. It was very scary at the time I was having problem breathing two nights ago. When ever I'm around toxins, my hands/arms will shake & my legs will feel like jello.  I don't think that anyone caused it happen, it's just we live in [place], [place] where there are a lot of toxins in the air because of all the factories in that area. I used to take medicines for my mental health. I have not taken those medicines in 5 years. Those medicines used to make so tired & groggy. I talked with my doctor then & he told me to stop taking those medicines. After I stopped taking those medicines, I have been doing great. I have been sleeping good. I don't feel groggy no more. I don't know if I want to get back on any medicines for mental stuff when I have been off of them for 5 years. I do not use drugs, but I smoke weed sometimes, not all the time. I smoked some blunt last week. That was it. I'm not depressed, just a little anxious because my husband works & I have cats & dogs. I have taking Risperdal ; Depakote, tramadol , Trazodone  & Paxil. I don't know if I want to go back to taking medicines after 5 years. I will need a little something for anxiety".  Objective: Patient presents alert, oriented x 3. She seems to be aware of the reason for her hospitalization. She presents as a good historian. Remembers most of the medicines she had taken in the past & her previous psychiatric provider (Dr. Cathlean Co). Although debating whether to be or not be on medications at this time, she did request something for anxiety. She currently denies any SIHI, AVH, delusional thoughts or paranoia. She does not appear to be responding to any internal stimuli.  This case was discussed with the attending psychiatrist. Latuda was recommended as patient is allergic to Abilify, Haldol  dec, Geodon , Seroquel, Cogentin . Apparently, Haldol  causes her to have stiffness, all others caused to have swellings around her eye areas. Other listed allergies included; Cocaine & Procaine. Patient is also allergic to Latuda, as a result this provider is unable to order this medicine. However, patient has been treated with Zyprexa  in the  past, seems like she did tolerate it well. Will initiate Olanzapine  5 mg po q hs for psychosis, starting to night.  Associated Signs/Symptoms:  Depression Symptoms:  Patient currently denies any symptoms of depression. However, says she is a bit anxious because of her cats & dogs. Says her husband works.  (Hypo) Manic Symptoms:  Impulsivity,  Anxiety Symptoms:  Excessive Worry,  Psychotic Symptoms:  Delusional thoughts/paranoid ideations.  PTSD Symptoms:  NA  Total Time spent with patient: 45 minutes  Past Psychiatric History: Schizoaffective disorder, stimulant use disorder.  Is the patient at risk to self? No.  Has the patient been a risk to self in the past 6 months? No.  Has the patient been a risk to self within the distant past? No.  Is the patient a risk to others? No.  Has the patient been a risk to others in the past 6 months? No.  Has the patient been a risk to others within the distant past? No.   [place] Scale:  Flowsheet Row [facility] ([facility]) [facility]30[facility]20[facility] in [facility]  C-SSRS RISK CATEGORY No Risk No Risk        Prior Inpatient Therapy: Yes.   If yes, describe: [facility] x multiple times.   Prior Outpatient Therapy: Yes.   If yes, describe: "That was a long time ago".   Alcohol Screening:    Substance Abuse History in the last 12 months:  No.  Consequences of Substance Abuse: NA  Previous Psychotropic Medications: Yes   Psychological Evaluations: Yes   Past Medical History:  Past Medical History:  Diagnosis Date   Anxiety    Bipolar 1 disorder (HCC)    pt denies, says was misdiagnosed   CHF (congestive heart failure) (HCC)    Chronic back pain    Chronic chest wall pain    Chronic right shoulder pain    Diabetes mellitus    Fibromyalgia    Hypertension    Panic attack    Psychotic disorder (HCC)    Schizoaffective disorder (HCC)      Past Surgical History:  Procedure Laterality Date   CHOLECYSTECTOMY     MANDIBLE SURGERY     Family History:  Family History  Problem Relation Age of Onset   Heart failure Mother    Heart failure Other    Family Psychiatric  History: Patient denies any familial hx of mental illness.   Tobacco Screening:  Social History   Tobacco Use  Smoking Status Every Day   Current packs/day: 1.00   Average packs/day: 1 pack/day for 30.0 years (30.0 ttl pk-yrs)   Types: Cigarettes  Smokeless Tobacco Never    BH Tobacco Counseling     Are you interested in Tobacco Cessation Medications?  No value filed. Counseled patient on smoking cessation:  No value filed. Reason Tobacco Screening Not Completed: No value filed.       Social History: Patient reports married, lives in [place] with her spouse. Has no children, disabled.  Collects disability checks. Social History   Substance and Sexual Activity  Alcohol Use No     Social History   Substance and Sexual Activity  Drug Use Yes   Types: Marijuana    Additional Social History:  Allergies:   Allergies  Allergen Reactions   Cocaine Anaphylaxis and Other (See Comments)    Eyes Swell Shut   Procaine Hcl Anaphylaxis    Eyes swell shut   Aripiprazole Other (See Comments)    Gives me "Knots in legs"   Cogentin  [Benztropine  Mesylate] Other (See Comments)    Knots in legs   Geodon  [Ziprasidone  Hcl] Other (See Comments)    Knots in Legs   Haldol  [Haloperidol  Decanoate] Nausea And Vomiting    Leg stiffness and Knots in legs   Seroquel [Quetiapine Fumarate] Hives   Lab Results:  Results for orders placed or performed during the hospital encounter of 12/31/23 (from the past 48 hours)  Glucose, capillary     Status: Abnormal   Collection Time: 12/31/23  8:27 PM  Result Value Ref Range   Glucose-Capillary 115 (H) 70 - 99 mg/dL    Comment: Glucose reference range applies only to samples taken after fasting for at least 8  hours.  Glucose, capillary     Status: None   Collection Time: 01/01/24  6:11 AM  Result Value Ref Range   Glucose-Capillary 91 70 - 99 mg/dL    Comment: Glucose reference range applies only to samples taken after fasting for at least 8 hours.  Glucose, capillary     Status: None   Collection Time: 01/01/24 11:43 AM  Result Value Ref Range   Glucose-Capillary 79 70 - 99 mg/dL    Comment: Glucose reference range applies only to samples taken after fasting for at least 8 hours.  Glucose, capillary     Status: Abnormal   Collection Time: 01/01/24  4:56 PM  Result Value Ref Range   Glucose-Capillary 138 (H) 70 - 99 mg/dL    Comment: Glucose reference range applies only to samples taken after fasting for at least 8 hours.   Blood Alcohol level:  Lab Results  Component Value Date   [facility] <15 12/30/2023   ETH <5 08/07/2015   Metabolic Disorder Labs:  Lab Results  Component Value Date   HGBA1C 4.8 12/30/2023   MPG 91.06 12/30/2023   MPG 301 08/26/2014   No results found for: "PROLACTIN" No results found for: "CHOL", "TRIG", "HDL", "CHOLHDL", "VLDL", "LDLCALC"  Current Medications: Current Facility-Administered Medications  Medication Dose Route Frequency Provider Last Rate Last Admin   alum & mag hydroxide-simeth (MAALOX/MYLANTA) 200-200-20 MG/5ML suspension 30 mL  30 mL Oral Q4H PRN Lewis, Tanika N, NP       insulin  aspart (novoLOG ) injection 0-5 Units  0-5 Units Subcutaneous QHS Lewis, Tanika N, NP       insulin  aspart (novoLOG ) injection 0-9 Units  0-9 Units Subcutaneous TID WC Lewis, Tanika N, NP       magnesium  hydroxide (MILK OF MAGNESIA) suspension 30 mL  30 mL Oral Daily PRN Lewis, Tanika N, NP   30 mL at 01/01/24 1201   melatonin tablet 10 mg  10 mg Oral QHS Dorthea Gauze, NP   10 mg at 12/31/23 2051   OLANZapine  (ZYPREXA ) injection 10 mg  10 mg Intramuscular TID PRN Levester Reagin, NP       OLANZapine  (ZYPREXA ) injection 5 mg  5 mg Intramuscular TID PRN Levester Reagin, NP  OLANZapine  zydis (ZYPREXA ) disintegrating tablet 5 mg  5 mg Oral TID PRN Lewis, Tanika N, NP       PTA Medications: No medications prior to admission.   Musculoskeletal: Strength & Muscle Tone: within normal limits Gait & Station: normal Patient leans: N/A  Psychiatric Specialty Exam:  Presentation  General Appearance: Appropriate for Environment; Casual; Disheveled  Eye Contact:Good  Speech:Clear and Coherent; Normal Rate  Speech Volume:Normal  Handedness:Right   Mood and Affect  Mood:Euphoric  Affect:Congruent   Thought Process  Thought Processes:Coherent; Linear  Duration of Psychotic Symptoms:Greater than 1 week.  Past Diagnosis of Schizophrenia or Psychoactive disorder: Yes  Descriptions of Associations:Intact  Orientation:Full (Time, Place and Person)  Thought Content:No data recorded Hallucinations:Hallucinations: None  Ideas of Reference:Delusions; Percusatory; Paranoia  Suicidal Thoughts:Suicidal Thoughts: No  Homicidal Thoughts:Homicidal Thoughts: No   Sensorium  Memory:Immediate Good; Recent Good; Remote Good  Judgment:Poor  Insight:None  Executive Functions  Concentration:Good  Attention Span:Good  Recall:Fair  Fund of Knowledge:Fair  Language:Good  Psychomotor Activity  Psychomotor Activity:Psychomotor Activity: Normal  Assets  Assets:Communication Skills; Desire for Improvement; Financial Resources/Insurance; Housing; Resilience; Social Support  Sleep  Sleep:Sleep: Good Number of Hours of Sleep: 7  Physical Exam: Physical Exam Vitals and nursing note reviewed.  HENT:     Head: Normocephalic.     Nose: Nose normal.     Mouth/Throat:     Pharynx: Oropharynx is clear.  Cardiovascular:     Rate and Rhythm: Normal rate.     Pulses: Normal pulses.  Pulmonary:     Effort: Pulmonary effort is normal.  Genitourinary:    Comments: Deferred Musculoskeletal:        General: Normal range of motion.     Cervical  back: Normal range of motion.  Skin:    General: Skin is dry.  Neurological:     General: No focal deficit present.     Mental Status: She is alert and oriented to person, place, and time.     Comments: Alert & awake   Review of Systems  Constitutional:  Negative for chills, diaphoresis and fever.  HENT:  Negative for congestion and sore throat. Nosebleeds: .Aaron Aas  Eyes:  Eye redness: .Aaron Aas  Respiratory:  Negative for cough, shortness of breath and wheezing.   Cardiovascular:  Negative for chest pain and palpitations.  Gastrointestinal:  Negative for abdominal pain, constipation, diarrhea, heartburn, nausea and vomiting.  Genitourinary:  Negative for dysuria.  Musculoskeletal:  Negative for joint pain and myalgias.  Neurological:  Negative for dizziness, tingling, tremors, sensory change, speech change, focal weakness, seizures, loss of consciousness, weakness and headaches.  Psychiatric/Behavioral:  Positive for hallucinations. Negative for memory loss, substance abuse and suicidal ideas (Delusional thoughts/paranoia.). The patient is nervous/anxious and has insomnia.    Blood pressure (!) 117/55, pulse 66, temperature 98.2 F (36.8 C), temperature source Oral, resp. rate 18, height 5\' 4"  (1.626 m), weight 111.6 kg, last menstrual period 01/24/2013, SpO2 95%. Body mass index is 42.23 kg/m.  Treatment Plan Summary: Daily contact with patient to assess and evaluate symptoms and progress in treatment and Medication management.   Principal/active diagnoses.  Schizoaffective disorder, bipolar type.  Plan: The risks/benefits/side-effects/alternatives to the medications in use were discussed in detail with the patient and time was given for patient's questions. The patient consents to medication trial.   -Initiated Zyprexa  5 mg po Q hs for psychosis. -Continue Melatonin 10 mg po Q hs.  Other PRNS -Continue Maalox 30 ml Q 4 hrs PRN for  indigestion -Continue MOM 30 ml po Q 6 hrs for  constipation  Safety and Monitoring: Voluntary admission to inpatient psychiatric unit for safety, stabilization and treatment Daily contact with patient to assess and evaluate symptoms and progress in treatment Patient's case to be discussed in multi-disciplinary team meeting Observation Level : q15 minute checks Vital signs: q12 hours Precautions: Safety  Discharge Planning: Social work and case management to assist with discharge planning and identification of hospital follow-up needs prior to discharge Estimated LOS: 5-7 days Discharge Concerns: Need to establish a safety plan; Medication compliance and effectiveness Discharge Goals: Return home with outpatient referrals for mental health follow-up including medication management/psychotherapy  Observation Level/Precautions:  15 minute checks  Laboratory:  Per ED, current lab results reviewed.  Psychotherapy: Enrolled in the   Medications: See MAR.  Consultations:  As needed.  Discharge Concerns:  Safety, mood stability.  Estimated LOS: 7 days.  Other: NA    Physician Treatment Plan for Primary Diagnosis: Schizoaffective disorder, bipolar type (HCC)  Long Term Goal(s): Improvement in symptoms so as ready for discharge  Short Term Goals: Ability to identify changes in lifestyle to reduce recurrence of condition will improve, Ability to verbalize feelings will improve, Ability to disclose and discuss suicidal ideas, and Ability to demonstrate self-control will improve  Physician Treatment Plan for Secondary Diagnosis: Principal Problem:   Schizoaffective disorder, bipolar type (HCC)  Long Term Goal(s): Improvement in symptoms so as ready for discharge  Short Term Goals: Ability to identify and develop effective coping behaviors will improve, Ability to maintain clinical measurements within normal limits will improve, and Compliance with prescribed medications will improve  I certify that inpatient services furnished can  reasonably be expected to improve the patient's condition.    Asuncion Layer, NP, pmhnp, fnp-bc. 6/6/20255:03 PM

## 2024-01-01 NOTE — [facility] Group Notes (Signed)
 Spirituality Group   Description: Participant directed exploration of values, beliefs and meaning   Following a brief framework of chaplain's role and ground rules of group behavior, participants are invited to share concerns or questions that engage spiritual life. Emphasis placed on common themes and shared experiences and ways to make meaning and clarify living into one's values.   Theory/Process/Goal: Utilize the theoretical framework of group therapy established by Derrell Flight, Relational Cultural Theory and Rogerian approaches to facilitate relational empathy and use of the "here and now" to foster reflection, self-awareness, and sharing.   Observations: [name] was an active participant in the group discussion. She named sources of joy in her life (eg, pets) and seemed to understand role of healthy boundary setting with family. (There may be negative views of mental health rooted in unsupportive religious beliefs of family.)  Jniyah Dantuono L. Minetta Aly, M.Div [phone]
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
## 2024-01-01 NOTE — Progress Notes (Signed)
   01/01/24 1000  Psych Admission Type (Psych Patients Only)  Admission Status Voluntary  Psychosocial Assessment  Patient Complaints Anxiety  Eye Contact Fair  Facial Expression Anxious  Affect Preoccupied  Speech Tangential  Interaction Assertive  Motor Activity Fidgety  Appearance/Hygiene Disheveled  Behavior Characteristics Cooperative  Mood Suspicious  Thought Process  Coherency Circumstantial  Content Paranoia  Delusions Persecutory  Perception WDL  Hallucination None reported or observed  Judgment Impaired  Confusion None  Danger to Self  Current suicidal ideation? Denies  Danger to Others  Danger to Others None reported or observed   Dar Note: Patient presents with anxious affect and mood.  Denies suicidal thoughts, auditory and visual hallucinations.  Medications given as prescribed.  Routine safety checks maintained.  Attended group and participated.  Stated goal for today, "relaxation, mindfulness and rest."  Patient is safe on and off the unit.

## 2024-01-01 NOTE — BHH Suicide Risk Assessment (Signed)
 Suicide Risk Assessment  Admission Assessment    [facility] Admission Suicide Risk Assessment   Nursing information obtained from:  Patient  Demographic factors:  Caucasian, Low socioeconomic status, Unemployed  Current Mental Status:  NA  Loss Factors:  Decrease in vocational status  Historical Factors:  Impulsivity  Risk Reduction Factors:  Positive social support, Sense of responsibility to family, Living with another person, especially a relative  Total Time spent with patient: 45 minutes  Principal Problem: Schizoaffective disorder, bipolar type (HCC)  Diagnosis:  Principal Problem:   Schizoaffective disorder, bipolar type (HCC)  Subjective Data: See H&P.  Continued Clinical Symptoms:    The "Alcohol Use Disorders Identification Test", Guidelines for Use in Primary Care, Second Edition.  World Science writer [facility]). Score between 0-7:  no or low risk or alcohol related problems. Score between 8-15:  moderate risk of alcohol related problems. Score between 16-19:  high risk of alcohol related problems. Score 20 or above:  warrants further diagnostic evaluation for alcohol dependence and treatment.  CLINICAL FACTORS:   Alcohol/Substance Abuse/Dependencies Currently Psychotic Unstable or Poor Therapeutic Relationship Previous Psychiatric Diagnoses and Treatments  Musculoskeletal: Strength & Muscle Tone: within normal limits Gait & Station: normal Patient leans: N/A  Psychiatric Specialty Exam:  Presentation  General Appearance: Appropriate for Environment; Casual; Disheveled  Eye Contact:Good  Speech:Clear and Coherent; Normal Rate  Speech Volume:Normal  Handedness:Right   Mood and Affect  Mood:Euphoric  Affect:Congruent   Thought Process  Thought Processes:Coherent; Linear  Descriptions of Associations:Intact  Orientation:Full (Time, Place and Person)  Thought Content:No data recorded History of Schizophrenia/Schizoaffective  disorder:Yes  Duration of Psychotic Symptoms:Greater than six months  Hallucinations:Hallucinations: None  Ideas of Reference:Delusions; Percusatory; Paranoia  Suicidal Thoughts:Suicidal Thoughts: No  Homicidal Thoughts:Homicidal Thoughts: No   Sensorium  Memory:Immediate Good; Recent Good; Remote Good  Judgment:Poor  Insight:None   Executive Functions  Concentration:Good  Attention Span:Good  Recall:Fair  Fund of Knowledge:Fair  Language:Good   Psychomotor Activity  Psychomotor Activity:Psychomotor Activity: Normal   Assets  Assets:Communication Skills; Desire for Improvement; Financial Resources/Insurance; Housing; Resilience; Social Support   Sleep  Sleep:Sleep: Good Number of Hours of Sleep: 7   Physical Exam/Ros: See H&P.  Blood pressure (!) 117/55, pulse 66, temperature 98.2 F (36.8 C), temperature source Oral, resp. rate 18, height 5\' 4"  (1.626 m), weight 111.6 kg, last menstrual period 01/24/2013, SpO2 95%. Body mass index is 42.23 kg/m.   COGNITIVE FEATURES THAT CONTRIBUTE TO RISK:  Loss of executive function and Thought constriction (tunnel vision)    SUICIDE RISK:   Moderate:  Frequent suicidal ideation with limited intensity, and duration, some specificity in terms of plans, no associated intent, good self-control, limited dysphoria/symptomatology, some risk factors present, and identifiable protective factors, including available and accessible social support.  PLAN OF CARE: See H&P.  I certify that inpatient services furnished can reasonably be expected to improve the patient's condition.   Asuncion Layer, NP, pmhnp, fnp-bc. 01/01/2024, 5:01 PM

## 2024-01-01 NOTE — BH IP Treatment Plan (Signed)
 Interdisciplinary Treatment and Diagnostic Plan Update  01/01/2024 Time of Session: 10:55 AM [name] MRN: [id number]  Principal Diagnosis: Schizoaffective disorder, bipolar type (HCC)  Secondary Diagnoses: Principal Problem:   Schizoaffective disorder, bipolar type (HCC)   Current Medications:  Current Facility-Administered Medications  Medication Dose Route Frequency Provider Last Rate Last Admin   alum & mag hydroxide-simeth (MAALOX/MYLANTA) 200-200-20 MG/5ML suspension 30 mL  30 mL Oral Q4H PRN Lewis, Tanika N, NP       insulin  aspart (novoLOG ) injection 0-5 Units  0-5 Units Subcutaneous QHS Lewis, Tanika N, NP       insulin  aspart (novoLOG ) injection 0-9 Units  0-9 Units Subcutaneous TID WC Lewis, Tanika N, NP       magnesium  hydroxide (MILK OF MAGNESIA) suspension 30 mL  30 mL Oral Daily PRN Lewis, Tanika N, NP   30 mL at 01/01/24 1201   melatonin tablet 10 mg  10 mg Oral QHS Dorthea Gauze, NP   10 mg at 12/31/23 2051   OLANZapine  (ZYPREXA ) injection 10 mg  10 mg Intramuscular TID PRN Levester Reagin, NP       OLANZapine  (ZYPREXA ) injection 5 mg  5 mg Intramuscular TID PRN Lewis, Tanika N, NP       OLANZapine  zydis (ZYPREXA ) disintegrating tablet 5 mg  5 mg Oral TID PRN Lewis, Tanika N, NP       PTA Medications: No medications prior to admission.    Patient Stressors: Health problems   Medication change or noncompliance    Patient Strengths: Capable of independent living  Communication skills  Supportive family/friends   Treatment Modalities: Medication Management, Group therapy, Case management,  1 to 1 session with clinician, Psychoeducation, Recreational therapy.   Physician Treatment Plan for Primary Diagnosis: Schizoaffective disorder, bipolar type (HCC) Long Term Goal(s): Improvement in symptoms so as ready for discharge   Short Term Goals: Ability to identify and develop effective coping behaviors will improve Ability to maintain clinical measurements  within normal limits will improve Compliance with prescribed medications will improve Ability to identify changes in lifestyle to reduce recurrence of condition will improve Ability to verbalize feelings will improve Ability to disclose and discuss suicidal ideas Ability to demonstrate self-control will improve  Medication Management: Evaluate patient's response, side effects, and tolerance of medication regimen.  Therapeutic Interventions: 1 to 1 sessions, Unit Group sessions and Medication administration.  Evaluation of Outcomes: Not Progressing  Physician Treatment Plan for Secondary Diagnosis: Principal Problem:   Schizoaffective disorder, bipolar type (HCC)  Long Term Goal(s): Improvement in symptoms so as ready for discharge   Short Term Goals: Ability to identify and develop effective coping behaviors will improve Ability to maintain clinical measurements within normal limits will improve Compliance with prescribed medications will improve Ability to identify changes in lifestyle to reduce recurrence of condition will improve Ability to verbalize feelings will improve Ability to disclose and discuss suicidal ideas Ability to demonstrate self-control will improve     Medication Management: Evaluate patient's response, side effects, and tolerance of medication regimen.  Therapeutic Interventions: 1 to 1 sessions, Unit Group sessions and Medication administration.  Evaluation of Outcomes: Not Progressing   RN Treatment Plan for Primary Diagnosis: Schizoaffective disorder, bipolar type (HCC) Long Term Goal(s): Knowledge of disease and therapeutic regimen to maintain health will improve  Short Term Goals: Ability to remain free from injury will improve, Ability to verbalize frustration and anger appropriately will improve, Ability to demonstrate self-control, Ability to participate in decision making will  improve, Ability to verbalize feelings will improve, Ability to disclose  and discuss suicidal ideas, Ability to identify and develop effective coping behaviors will improve, and Compliance with prescribed medications will improve  Medication Management: RN will administer medications as ordered by provider, will assess and evaluate patient's response and provide education to patient for prescribed medication. RN will report any adverse and/or side effects to prescribing provider.  Therapeutic Interventions: 1 on 1 counseling sessions, Psychoeducation, Medication administration, Evaluate responses to treatment, Monitor vital signs and CBGs as ordered, Perform/monitor CIWA, COWS, AIMS and Fall Risk screenings as ordered, Perform wound care treatments as ordered.  Evaluation of Outcomes: Not Progressing   LCSW Treatment Plan for Primary Diagnosis: Schizoaffective disorder, bipolar type (HCC) Long Term Goal(s): Safe transition to appropriate next level of care at discharge, Engage patient in therapeutic group addressing interpersonal concerns.  Short Term Goals: Engage patient in aftercare planning with referrals and resources, Increase social support, Increase ability to appropriately verbalize feelings, Increase emotional regulation, Facilitate acceptance of mental health diagnosis and concerns, Facilitate patient progression through stages of change regarding substance use diagnoses and concerns, Identify triggers associated with mental health/substance abuse issues, and Increase skills for wellness and recovery  Therapeutic Interventions: Assess for all discharge needs, 1 to 1 time with Social worker, Explore available resources and support systems, Assess for adequacy in community support network, Educate family and significant other(s) on suicide prevention, Complete Psychosocial Assessment, Interpersonal group therapy.  Evaluation of Outcomes: Not Progressing   Progress in Treatment: Attending groups: attended some groups Participating in groups: Yes Taking  medication as prescribed: Patient hasn't been prescribed any medications yet. Toleration medication:  N/A Family/Significant other contact made: No, will contact:  [name] (husband) [phone] Patient understands diagnosis: No. Discussing patient identified problems/goals with staff: No. Medical problems stabilized or resolved: Yes. Denies suicidal/homicidal ideation: Yes. Issues/concerns per patient self-inventory: No.  New problem(s) identified:  No  New Short Term/Long Term Goal(s):    medication stabilization, elimination of SI thoughts, development of comprehensive mental wellness plan.   Patient Goals:  "I have poison in my blood.  When I'm around radioactive waste, I start running a fever, feel it in my muscles, and I get physically sick."    Discharge Plan or Barriers:  Patient recently admitted. CSW will continue to follow and assess for appropriate referrals and possible discharge planning.   Reason for Continuation of Hospitalization: Delusions  Medication stabilization  Estimated Length of Stay:  5 - 7 days  Last 3 [place] Suicide Severity Risk Score: Flowsheet Row [facility] (Current[facility] from 12/30/2023 in [facility] at [facility]  C-SSRS RISK CATEGORY No Risk No Risk       Last PHQ 2/9 Scores:     No data to display          Scribe for Treatment Team: Teaghan Melrose O Starling Christofferson, LCSWA 01/01/2024 7:12 PM

## 2024-01-01 NOTE — Plan of Care (Signed)
   Problem: Education: Goal: Emotional status will improve Outcome: Not Progressing Goal: Mental status will improve Outcome: Not Progressing

## 2024-01-02 LAB — GLUCOSE, CAPILLARY
Glucose-Capillary: 89 mg/dL (ref 70–99)
Glucose-Capillary: 104 mg/dL — ABNORMAL HIGH (ref 70–99)
Glucose-Capillary: 130 mg/dL — ABNORMAL HIGH (ref 70–99)
Glucose-Capillary: 175 mg/dL — ABNORMAL HIGH (ref 70–99)

## 2024-01-02 MED ORDER — LURASIDONE HCL 40 MG PO TABS
40.0000 mg | ORAL_TABLET | Freq: Every day | ORAL | Status: DC
  Administered 2024-01-02 – 2024-01-06 (×5): 40 mg via ORAL
  Filled 2024-01-02 (×5): qty 1

## 2024-01-02 NOTE — Group Note (Signed)
 Date:  01/02/2024 Time:  8:40 PM  Group Topic/Focus:  Wrap-Up Group:   The focus of this group is to help patients review their daily goal of treatment and discuss progress on daily workbooks.    Participation Level:  Active  Participation Quality:  Appropriate  Affect:  Appropriate  Cognitive:  Appropriate  Insight: Appropriate  Engagement in Group:  Engaged  Modes of Intervention:  Education and Exploration  Additional Comments:  Patient attended and participated in group tonight. She report that the best thing that happened for her today was getting rest.  Eugena Herter Dacosta 01/02/2024, 8:40 PM

## 2024-01-02 NOTE — Progress Notes (Signed)
 Patient remains invested with her delusions of being exposed to radiation.  Preoccupied with scorpion poison in her system which selectively predisposes her to the radiation.  Patient has no insight as she does not believe that this is in her mind.  I have encouraged her to think through initiating lurasidone as it would help her feel better.  We will continue to motivate her towards treatment.

## 2024-01-02 NOTE — Progress Notes (Signed)
 [facility] MD Progress Note  01/02/2024 4:23 PM [name]  MRN:  [id number]  Principal Problem: Schizoaffective disorder, bipolar type (HCC) Diagnosis: Principal Problem:   Schizoaffective disorder, bipolar type [facility])  Reason for admission:   This is an admission evaluation for this 58 year old Caucasian female with history of schizoaffective disorder and probably substance use disorder. Patient has been admitted/treated for mental health related issues remotely and this [facility]. Chart review has indicated that the last time patient was in this hospital was 2015, almost 10 years ago.  At the time, she was treated for schizoaffective disorder.  At the time patient was discharged on BuSpar  10 mg, Tegretol  XR 200 mg, olanzapine  5 mg and trazodone  50 mg.  Patient has no other record of psychiatric hospitalizations since 2015.  However, she is being admitted to this [facility] this time from the [facility] in [place] .  Chart review indicated that patient was a police drop-off with complaint of psychosis as she was feeling like the radioactive trash in her neighborhood may be poisoning her. A review of her current lab results has shown basically normal levels. However, will obtain, lipid panel.  Her toxicology & UDS results were clear.   24-hour chart reviewed: Vital signs reviewed without critical values except pulse rate of 55, which patient remains asymptomatic.  Compliant with psychotropic medications.  As needed Milk of Magnesia required for mild constipation.  No agitation protocol required.  Today's assessment notes: On assessment today, the pt reports that her mood is less depressed.  Rates depression as number is 0/10 with 10 being high severity.  She is alert, pleasant, cooperative, and oriented to person, place, time, however not situation.  Continues with  delusional preoccupation of being exposed to radiation from [facility].  Reports the bad chemical radiation is causing  her some weakness, shaking, cramping in her muscles and bones, and hot sweats and chills.  Patient able to perform ADLs without complaint of fatigue.  Chart reviewed and findings shared with the treatment team and consult with attending psychiatrist, with recommendation to discontinue Zyprexa  and initiate Latuda 40 mg p.o. at bedtime with food for her psychosis.  Patient is amendable for medication adjustment.  She denies suicidal ideation, homicidal ideation or AVH. Reports that anxiety is at manageable level and rates as #2/10, with 10 being high severity Sleep is improving, reports sleeping 6 hours throughout the night. Appetite is good Concentration is improving Energy level is adequate Denies suicidal thoughts.  Further denies suicidal intent and plan.  Denies having any HI.  Denies having psychotic symptoms, however continues with delusional preoccupation  Denies having side effects to current psychiatric medications.   We discussed changes to current medication regimen, including discontinuing Zyprexa , and initiating Latuda 40 mg p.o. q. nightly with food for psychosis.  Patient is amendable to continuing with Latuda for psychosis.  Total Time spent with patient: 45 minutes  Past Psychiatric History:  Schizoaffective disorder, stimulant use disorder.   Past Medical History:  Past Medical History:  Diagnosis Date   Anxiety    Bipolar 1 disorder (HCC)    pt denies, says was misdiagnosed   CHF (congestive heart failure) (HCC)    Chronic back pain    Chronic chest wall pain    Chronic right shoulder pain    Diabetes mellitus    Fibromyalgia    Hypertension    Panic attack    Psychotic disorder (HCC)    Schizoaffective disorder (HCC)  Past Surgical History:  Procedure Laterality Date   CHOLECYSTECTOMY     MANDIBLE SURGERY     Family History:  Family History  Problem Relation Age of Onset   Heart failure Mother    Heart failure Other    Family Psychiatric  History:  See H&P Social History:  Social History   Substance and Sexual Activity  Alcohol Use No     Social History   Substance and Sexual Activity  Drug Use Yes   Types: Marijuana    Social History   Socioeconomic History   Marital status: Legally Separated    Spouse name: Not on file   Number of children: Not on file   Years of education: Not on file   Highest education level: Not on file  Occupational History   Not on file  Tobacco Use   Smoking status: Every Day    Current packs/day: 1.00    Average packs/day: 1 pack/day for 30.0 years (30.0 ttl pk-yrs)    Types: Cigarettes   Smokeless tobacco: Never  Substance and Sexual Activity   Alcohol use: No   Drug use: Yes    Types: Marijuana   Sexual activity: Yes    Birth control/protection: None  Other Topics Concern   Not on file  Social History Narrative   Not on file   Social Drivers of Health   Financial Resource Strain: Not on file  Food Insecurity: No Food Insecurity (12/31/2023)   Hunger Vital Sign    Worried About Running Out of Food in the Last Year: Never true    Ran Out of Food in the Last Year: Never true  Transportation Needs: No Transportation Needs (12/31/2023)   PRAPARE - Administrator, Civil Service (Medical): No    Lack of Transportation (Non-Medical): No  Physical Activity: Not on file  Stress: Not on file  Social Connections: Not on file   Additional Social History:     Sleep: Good  Appetite:  Good  Current Medications: Current Facility-Administered Medications  Medication Dose Route Frequency Provider Last Rate Last Admin   alum & mag hydroxide-simeth (MAALOX/MYLANTA) 200-200-20 MG/5ML suspension 30 mL  30 mL Oral Q4H PRN Lewis, Tanika N, NP       insulin  aspart (novoLOG ) injection 0-5 Units  0-5 Units Subcutaneous QHS Lewis, Tanika N, NP       insulin  aspart (novoLOG ) injection 0-9 Units  0-9 Units Subcutaneous TID WC Lewis, Tanika N, NP       lurasidone (LATUDA) tablet 40 mg  40  mg Oral QHS Graydon Fofana C, FNP       magnesium  hydroxide (MILK OF MAGNESIA) suspension 30 mL  30 mL Oral Daily PRN Lewis, Tanika N, NP   30 mL at 01/01/24 1201   melatonin tablet 10 mg  10 mg Oral QHS Dorthea Gauze, NP   10 mg at 01/01/24 2032   OLANZapine  (ZYPREXA ) injection 10 mg  10 mg Intramuscular TID PRN Levester Reagin, NP       OLANZapine  (ZYPREXA ) injection 5 mg  5 mg Intramuscular TID PRN Levester Reagin, NP       OLANZapine  zydis (ZYPREXA ) disintegrating tablet 5 mg  5 mg Oral TID PRN Levester Reagin, NP       Lab Results:  Results for orders placed or performed during the hospital encounter of 12/31/23 (from the past 48 hours)  Glucose, capillary     Status: Abnormal   Collection Time: 12/31/23  8:27  PM  Result Value Ref Range   Glucose-Capillary 115 (H) 70 - 99 mg/dL    Comment: Glucose reference range applies only to samples taken after fasting for at least 8 hours.  Glucose, capillary     Status: None   Collection Time: 01/01/24  6:11 AM  Result Value Ref Range   Glucose-Capillary 91 70 - 99 mg/dL    Comment: Glucose reference range applies only to samples taken after fasting for at least 8 hours.  Glucose, capillary     Status: None   Collection Time: 01/01/24 11:43 AM  Result Value Ref Range   Glucose-Capillary 79 70 - 99 mg/dL    Comment: Glucose reference range applies only to samples taken after fasting for at least 8 hours.  Glucose, capillary     Status: Abnormal   Collection Time: 01/01/24  4:56 PM  Result Value Ref Range   Glucose-Capillary 138 (H) 70 - 99 mg/dL    Comment: Glucose reference range applies only to samples taken after fasting for at least 8 hours.  Glucose, capillary     Status: Abnormal   Collection Time: 01/01/24  8:21 PM  Result Value Ref Range   Glucose-Capillary 116 (H) 70 - 99 mg/dL    Comment: Glucose reference range applies only to samples taken after fasting for at least 8 hours.  Glucose, capillary     Status: None   Collection Time:  01/02/24  5:43 AM  Result Value Ref Range   Glucose-Capillary 89 70 - 99 mg/dL    Comment: Glucose reference range applies only to samples taken after fasting for at least 8 hours.  Glucose, capillary     Status: Abnormal   Collection Time: 01/02/24 12:09 PM  Result Value Ref Range   Glucose-Capillary 104 (H) 70 - 99 mg/dL    Comment: Glucose reference range applies only to samples taken after fasting for at least 8 hours.   Blood Alcohol level:  Lab Results  Component Value Date   [facility] <15 12/30/2023   ETH <5 08/07/2015   Metabolic Disorder Labs: Lab Results  Component Value Date   HGBA1C 4.8 12/30/2023   MPG 91.06 12/30/2023   MPG 301 08/26/2014   No results found for: "PROLACTIN" No results found for: "CHOL", "TRIG", "HDL", "CHOLHDL", "VLDL", "LDLCALC"  Physical Findings: AIMS:  , ,  ,  ,    CIWA:    COWS:     Musculoskeletal: Strength & Muscle Tone: within normal limits Gait & Station: normal Patient leans: N/A  Psychiatric Specialty Exam:  Presentation  General Appearance:  Disheveled; Casual; Appropriate for Environment  Eye Contact: Good  Speech: Clear and Coherent; Normal Rate  Speech Volume: Normal  Handedness: Right  Mood and Affect  Mood: Anxious; Depressed  Affect: Congruent  Thought Process  Thought Processes: Coherent  Descriptions of Associations:Intact  Orientation:Full (Time, Place and Person)  Thought Content:Logical  History of Schizophrenia/Schizoaffective disorder:Yes  Duration of Psychotic Symptoms:Greater than six months  Hallucinations:Hallucinations: None  Ideas of Reference:None  Suicidal Thoughts:Suicidal Thoughts: No  Homicidal Thoughts:Homicidal Thoughts: No  Sensorium  Memory: Immediate Good; Recent Good  Judgment: Poor  Insight: Lacking  Executive Functions  Concentration: Good  Attention Span: Good  Recall: Fair  Fund of Knowledge: Fair  Language: Good  Psychomotor Activity   Psychomotor Activity: Psychomotor Activity: Normal  Assets  Assets: Communication Skills; Desire for Improvement; Physical Health; Resilience  Sleep  Sleep: Sleep: Good Number of Hours of Sleep: 6  Physical Exam: Physical  Exam Vitals and nursing note reviewed.  Constitutional:      Appearance: She is obese.  HENT:     Head: Normocephalic.     Right Ear: External ear normal.     Left Ear: External ear normal.     Nose: Nose normal.     Mouth/Throat:     Mouth: Mucous membranes are moist.     Pharynx: Oropharynx is clear.  Eyes:     Extraocular Movements: Extraocular movements intact.  Cardiovascular:     Rate and Rhythm: Bradycardia present.     Comments: Blood pressure 127/76, pulse rate 55.  Patient is asymptomatic.  Nursing staff to recheck vital signs. Pulmonary:     Effort: Pulmonary effort is normal. No respiratory distress.  Abdominal:     Comments: Deferred  Genitourinary:    Comments: Deferred Musculoskeletal:        General: Normal range of motion.     Cervical back: Normal range of motion.  Skin:    General: Skin is warm.  Neurological:     General: No focal deficit present.     Mental Status: She is alert and oriented to person, place, and time.  Psychiatric:        Mood and Affect: Mood normal.        Behavior: Behavior normal.    Review of Systems  Constitutional:  Negative for chills and fever.  HENT:  Negative for sore throat.   Eyes:  Negative for blurred vision.  Respiratory:  Negative for cough, sputum production, shortness of breath and wheezing.   Cardiovascular:  Negative for chest pain and palpitations.  Gastrointestinal:  Negative for heartburn and nausea.  Genitourinary:  Negative for dysuria and urgency.  Musculoskeletal:  Negative for falls.  Skin:  Negative for rash.  Neurological:  Negative for dizziness and headaches.  Endo/Heme/Allergies:        See allergy listing  Psychiatric/Behavioral:  Positive for depression. Negative  for hallucinations, substance abuse and suicidal ideas. The patient is nervous/anxious. The patient does not have insomnia.    Blood pressure 127/76, pulse (!) 55, temperature 97.8 F (36.6 C), temperature source Oral, resp. rate 18, height 5\' 4"  (1.626 m), weight 111.6 kg, last menstrual period 01/24/2013, SpO2 98%. Body mass index is 42.23 kg/m.  Treatment Plan Summary: Daily contact with patient to assess and evaluate symptoms and progress in treatment and Medication management Principal/active diagnoses.  Schizoaffective disorder, bipolar type.  Plan: The risks/benefits/side-effects/alternatives to the medications in use were discussed in detail with the patient and time was given for patient's questions. The patient consents to medication trial.    -Discontinue Zyprexa  5 mg po Q hs for psychosis 01/02/24. -Initiate Latuda 40 mg po at bedtime for psychosis -Continue Melatonin 10 mg po Q hs.   Other PRNS -Continue Maalox 30 ml Q 4 hrs PRN for indigestion -Continue MOM 30 ml po Q 6 hrs for constipation   Safety and Monitoring: Voluntary admission to inpatient psychiatric unit for safety, stabilization and treatment Daily contact with patient to assess and evaluate symptoms and progress in treatment Patient's case to be discussed in multi-disciplinary team meeting Observation Level : q15 minute checks Vital signs: q12 hours Precautions: Safety   Discharge Planning: Social work and case management to assist with discharge planning and identification of hospital follow-up needs prior to discharge Estimated LOS: 5-7 days Discharge Concerns: Need to establish a safety plan; Medication compliance and effectiveness Discharge Goals: Return home with outpatient referrals for mental health  follow-up including medication management/psychotherapy    Physician Treatment Plan for Primary Diagnosis: Schizoaffective disorder, bipolar type (HCC)   Long Term Goal(s): Improvement in symptoms so as  ready for discharge   Short Term Goals: Ability to identify changes in lifestyle to reduce recurrence of condition will improve, Ability to verbalize feelings will improve, Ability to disclose and discuss suicidal ideas, and Ability to demonstrate self-control will improve   Physician Treatment Plan for Secondary Diagnosis: Principal Problem:   Schizoaffective disorder, bipolar type (HCC)   Long Term Goal(s): Improvement in symptoms so as ready for discharge   Short Term Goals: Ability to identify and develop effective coping behaviors will improve, Ability to maintain clinical measurements within normal limits will improve, and Compliance with prescribed medications will improve   I certify that inpatient services furnished can reasonably be expected to improve the patient's condition.      Chaunte Hornbeck C Ahmed Inniss, FNP 01/02/2024, 4:23 PM

## 2024-01-02 NOTE — Progress Notes (Signed)
   01/02/24 2100  Psych Admission Type (Psych Patients Only)  Admission Status Voluntary  Psychosocial Assessment  Patient Complaints Anxiety  Eye Contact Brief  Facial Expression Anxious  Affect Appropriate to circumstance;Preoccupied  Speech Tangential  Interaction Assertive  Motor Activity Slow  Appearance/Hygiene Disheveled  Behavior Characteristics Appropriate to situation;Cooperative;Anxious  Mood Anxious;Preoccupied;Pleasant  Thought Process  Coherency Circumstantial  Content Paranoia  Delusions Persecutory  Perception WDL  Hallucination None reported or observed  Judgment Impaired  Confusion None  Danger to Self  Current suicidal ideation? Denies  Danger to Others  Danger to Others None reported or observed

## 2024-01-02 NOTE — Plan of Care (Signed)
  Problem: Activity: Goal: Interest or engagement in activities will improve Outcome: Progressing   Problem: Coping: Goal: Ability to demonstrate self-control will improve Outcome: Progressing   

## 2024-01-02 NOTE — Progress Notes (Signed)
   01/02/24 1500  Psych Admission Type (Psych Patients Only)  Admission Status Voluntary  Psychosocial Assessment  Patient Complaints Irritability  Eye Contact Fair  Facial Expression Angry  Affect Appropriate to circumstance  Speech Logical/coherent  Interaction Assertive  Motor Activity Slow  Appearance/Hygiene Unremarkable  Behavior Characteristics Appropriate to situation  Mood Suspicious  Thought Process  Coherency Circumstantial  Content Paranoia  Delusions Paranoid  Perception WDL  Hallucination None reported or observed  Judgment Impaired  Confusion None  Danger to Self  Current suicidal ideation? Denies  Danger to Others  Danger to Others None reported or observed

## 2024-01-03 ENCOUNTER — Encounter (HOSPITAL_COMMUNITY): Payer: Self-pay | Admitting: Psychiatry

## 2024-01-03 LAB — GLUCOSE, CAPILLARY
Glucose-Capillary: 99 mg/dL (ref 70–99)
Glucose-Capillary: 103 mg/dL — ABNORMAL HIGH (ref 70–99)
Glucose-Capillary: 119 mg/dL — ABNORMAL HIGH (ref 70–99)
Glucose-Capillary: 151 mg/dL — ABNORMAL HIGH (ref 70–99)

## 2024-01-03 NOTE — Progress Notes (Signed)
   01/03/24 2100  Psych Admission Type (Psych Patients Only)  Admission Status Voluntary  Psychosocial Assessment  Patient Complaints Worrying  Eye Contact Brief  Facial Expression Anxious  Affect Appropriate to circumstance;Preoccupied  Speech Tangential  Interaction Assertive  Motor Activity Slow  Appearance/Hygiene Disheveled  Behavior Characteristics Cooperative;Appropriate to situation  Mood Pleasant;Preoccupied  Thought Process  Coherency Circumstantial  Content Paranoia  Delusions Persecutory  Perception WDL  Hallucination None reported or observed  Judgment Impaired  Confusion None  Danger to Self  Current suicidal ideation? Denies  Danger to Others  Danger to Others None reported or observed

## 2024-01-03 NOTE — Plan of Care (Signed)
  Problem: Education: Goal: Emotional status will improve Outcome: Progressing Goal: Verbalization of understanding the information provided will improve Outcome: Progressing   Problem: Activity: Goal: Sleeping patterns will improve Outcome: Progressing   Problem: Coping: Goal: Ability to verbalize frustrations and anger appropriately will improve Outcome: Progressing

## 2024-01-03 NOTE — Plan of Care (Signed)
  Problem: Activity: Goal: Sleeping patterns will improve Outcome: Progressing   Problem: Safety: Goal: Periods of time without injury will increase Outcome: Progressing   Problem: Education: Goal: Emotional status will improve Outcome: Progressing   Problem: Activity: Goal: Interest or engagement in activities will improve Outcome: Progressing

## 2024-01-03 NOTE — Group Note (Signed)
 Date:  01/03/2024 Time:  8:50 PM  Group Topic/Focus:  Wrap-Up Group:   The focus of this group is to help patients review their daily goal of treatment and discuss progress on daily workbooks.    Participation Level:  Active  Participation Quality:  Appropriate  Affect:  Appropriate  Cognitive:  Appropriate  Insight: Appropriate  Engagement in Group:  Engaged  Modes of Intervention:  Discussion  Additional Comments:   Pt states she's had a good day, she's been able to socialize, she has attended groups, spoke with her doctor, and called some of her relatives. Pt endorsed anxiety at a 3 and rotator cuff pain.  [name] A Linda Biehn 01/03/2024, 8:50 PM

## 2024-01-03 NOTE — Progress Notes (Deleted)
   01/03/24 1100  Psych Admission Type (Psych Patients Only)  Admission Status Voluntary  Psychosocial Assessment  Patient Complaints Anxiety  Eye Contact Fair  Facial Expression Anxious  Affect Anxious;Preoccupied  Speech Logical/coherent  Interaction Assertive  Motor Activity Slow  Appearance/Hygiene Layered clothes  Behavior Characteristics Appropriate to situation;Cooperative  Mood Suspicious;Anxious;Pleasant  Thought Process  Coherency WDL  Content WDL  Delusions None reported or observed  Perception WDL  Hallucination None reported or observed  Judgment Impaired  Confusion None  Danger to Self  Current suicidal ideation? Denies  Danger to Others  Danger to Others None reported or observed

## 2024-01-03 NOTE — Progress Notes (Signed)
 Patient remains invested with her delusions of being exposed to radiation.  Preoccupied with scorpion poison in her system which selectively predisposes her to the radiation.  Patient has no insight as she does not believe that this is in her mind.  I have encouraged her to think through initiating lurasidone as it would help her feel better.  We will continue to motivate her towards treatment.  [facility] MD Progress Note  01/03/2024 1:43 PM [name]  MRN:  [id number]  Principal Problem: Schizoaffective disorder, bipolar type (HCC) Diagnosis: Principal Problem:   Schizoaffective disorder, bipolar type [facility])  Reason for admission:   This is an admission evaluation for this 58 year old Caucasian female with history of schizoaffective disorder and probably substance use disorder. Patient has been admitted/treated for mental health related issues remotely and this [facility]. Chart review has indicated that the last time patient was in this hospital was 2015, almost 10 years ago.  At the time, she was treated for schizoaffective disorder.  At the time patient was discharged on BuSpar  10 mg, Tegretol  XR 200 mg, olanzapine  5 mg and trazodone  50 mg.  Patient has no other record of psychiatric hospitalizations since 2015.  However, she is being admitted to this [facility] this time from the [facility] in [place] .  Chart review indicated that patient was a police drop-off with complaint of psychosis as she was feeling like the radioactive trash in her neighborhood may be poisoning her. A review of her current lab results has shown basically normal levels. However, will obtain, lipid panel.  Her toxicology & UDS results were clear.   24-hour chart reviewed: Vital signs reviewed without critical values.  Compliant with psychotropic medications.  No as needed required. No agitation protocol required.  Today's assessment notes: [name] reports that her mood is less depressed.  Rates depression as  number is 0/10 with 10 being high severity.  She is alert, pleasant, cooperative, and oriented to person, place, time, however not situation.  Patient reports "the radiation, is leaving my body and I am ready to leave the hospital to go home and help her husband. Chart reviewed and findings shared with the treatment team and consult with attending psychiatrist, with recommendation to continue Latuda 40 mg p.o. at bedtime with food for her psychosis.  Patient is compliant with Latuda without any side effects of muscle stiffness, restlessness, weakness or slow movement.  She denies suicidal ideation, homicidal ideation or AVH.  Reports that anxiety is at manageable level and rates as #2/10, with 10 being high severity Sleep is improving, reports sleeping 6 hours throughout the night. Appetite is good Concentration is improving Energy level is adequate Denies suicidal thoughts.  Further denies suicidal intent and plan.  Denies having any HI.  Denies having psychotic symptoms, however continues with delusional preoccupation  Denies having side effects to current psychiatric medications.   We discussed compliance to current medication regimen.  Total Time spent with patient: 45 minutes  Past Psychiatric History:  Schizoaffective disorder, stimulant use disorder.   Past Medical History:  Past Medical History:  Diagnosis Date   Anxiety    Bipolar 1 disorder (HCC)    pt denies, says was misdiagnosed   CHF (congestive heart failure) (HCC)    Chronic back pain    Chronic chest wall pain    Chronic right shoulder pain    Diabetes mellitus    Fibromyalgia    Hypertension    Panic attack    Psychotic disorder (  HCC)    Schizoaffective disorder (HCC)     Past Surgical History:  Procedure Laterality Date   CHOLECYSTECTOMY     MANDIBLE SURGERY     Family History:  Family History  Problem Relation Age of Onset   Heart failure Mother    Heart failure Other    Family Psychiatric  History:  See H&P Social History:  Social History   Substance and Sexual Activity  Alcohol Use No     Social History   Substance and Sexual Activity  Drug Use Yes   Types: Marijuana    Social History   Socioeconomic History   Marital status: Legally Separated    Spouse name: Not on file   Number of children: Not on file   Years of education: Not on file   Highest education level: Not on file  Occupational History   Not on file  Tobacco Use   Smoking status: Every Day    Current packs/day: 1.00    Average packs/day: 1 pack/day for 30.0 years (30.0 ttl pk-yrs)    Types: Cigarettes   Smokeless tobacco: Never  Substance and Sexual Activity   Alcohol use: No   Drug use: Yes    Types: Marijuana   Sexual activity: Yes    Birth control/protection: None  Other Topics Concern   Not on file  Social History Narrative   Not on file   Social Drivers of Health   Financial Resource Strain: Not on file  Food Insecurity: No Food Insecurity (12/31/2023)   Hunger Vital Sign    Worried About Running Out of Food in the Last Year: Never true    Ran Out of Food in the Last Year: Never true  Transportation Needs: No Transportation Needs (12/31/2023)   PRAPARE - Administrator, Civil Service (Medical): No    Lack of Transportation (Non-Medical): No  Physical Activity: Not on file  Stress: Not on file  Social Connections: Not on file   Additional Social History:     Sleep: Good  Appetite:  Good  Current Medications: Current Facility-Administered Medications  Medication Dose Route Frequency Provider Last Rate Last Admin   alum & mag hydroxide-simeth (MAALOX/MYLANTA) 200-200-20 MG/5ML suspension 30 mL  30 mL Oral Q4H PRN Lewis, Tanika N, NP       insulin  aspart (novoLOG ) injection 0-5 Units  0-5 Units Subcutaneous QHS Lewis, Tanika N, NP       insulin  aspart (novoLOG ) injection 0-9 Units  0-9 Units Subcutaneous TID WC Lewis, Tanika N, NP       lurasidone (LATUDA) tablet 40 mg  40  mg Oral QHS Adekunle Rohrbach C, FNP   40 mg at 01/02/24 2049   magnesium  hydroxide (MILK OF MAGNESIA) suspension 30 mL  30 mL Oral Daily PRN Levester Reagin, NP   30 mL at 01/01/24 1201   melatonin tablet 10 mg  10 mg Oral QHS Dorthea Gauze, NP   10 mg at 01/02/24 2049   OLANZapine  (ZYPREXA ) injection 10 mg  10 mg Intramuscular TID PRN Levester Reagin, NP       OLANZapine  (ZYPREXA ) injection 5 mg  5 mg Intramuscular TID PRN Lewis, Tanika N, NP       OLANZapine  zydis (ZYPREXA ) disintegrating tablet 5 mg  5 mg Oral TID PRN Levester Reagin, NP       Lab Results:  Results for orders placed or performed during the hospital encounter of 12/31/23 (from the past 48 hours)  Glucose,  capillary     Status: Abnormal   Collection Time: 01/01/24  4:56 PM  Result Value Ref Range   Glucose-Capillary 138 (H) 70 - 99 mg/dL    Comment: Glucose reference range applies only to samples taken after fasting for at least 8 hours.  Glucose, capillary     Status: Abnormal   Collection Time: 01/01/24  8:21 PM  Result Value Ref Range   Glucose-Capillary 116 (H) 70 - 99 mg/dL    Comment: Glucose reference range applies only to samples taken after fasting for at least 8 hours.  Glucose, capillary     Status: None   Collection Time: 01/02/24  5:43 AM  Result Value Ref Range   Glucose-Capillary 89 70 - 99 mg/dL    Comment: Glucose reference range applies only to samples taken after fasting for at least 8 hours.  Glucose, capillary     Status: Abnormal   Collection Time: 01/02/24 12:09 PM  Result Value Ref Range   Glucose-Capillary 104 (H) 70 - 99 mg/dL    Comment: Glucose reference range applies only to samples taken after fasting for at least 8 hours.  Glucose, capillary     Status: Abnormal   Collection Time: 01/02/24  4:57 PM  Result Value Ref Range   Glucose-Capillary 130 (H) 70 - 99 mg/dL    Comment: Glucose reference range applies only to samples taken after fasting for at least 8 hours.  Glucose, capillary      Status: Abnormal   Collection Time: 01/02/24  7:41 PM  Result Value Ref Range   Glucose-Capillary 175 (H) 70 - 99 mg/dL    Comment: Glucose reference range applies only to samples taken after fasting for at least 8 hours.  Glucose, capillary     Status: None   Collection Time: 01/03/24  6:21 AM  Result Value Ref Range   Glucose-Capillary 99 70 - 99 mg/dL    Comment: Glucose reference range applies only to samples taken after fasting for at least 8 hours.  Glucose, capillary     Status: Abnormal   Collection Time: 01/03/24 11:52 AM  Result Value Ref Range   Glucose-Capillary 103 (H) 70 - 99 mg/dL    Comment: Glucose reference range applies only to samples taken after fasting for at least 8 hours.   Blood Alcohol level:  Lab Results  Component Value Date   [facility] <15 12/30/2023   ETH <5 08/07/2015   Metabolic Disorder Labs: Lab Results  Component Value Date   HGBA1C 4.8 12/30/2023   MPG 91.06 12/30/2023   MPG 301 08/26/2014   No results found for: "PROLACTIN" No results found for: "CHOL", "TRIG", "HDL", "CHOLHDL", "VLDL", "LDLCALC"  Physical Findings: AIMS:  , ,  ,  ,    CIWA:    COWS:     Musculoskeletal: Strength & Muscle Tone: within normal limits Gait & Station: normal Patient leans: N/A  Psychiatric Specialty Exam:  Presentation  General Appearance:  Disheveled; Casual; Appropriate for Environment  Eye Contact: Good  Speech: Clear and Coherent  Speech Volume: Normal  Handedness: Right  Mood and Affect  Mood: Anxious; Depressed  Affect: Congruent  Thought Process  Thought Processes: Coherent  Descriptions of Associations:Intact  Orientation:Full (Time, Place and Person)  Thought Content:Logical  History of Schizophrenia/Schizoaffective disorder:Yes  Duration of Psychotic Symptoms:Greater than six months  Hallucinations:Hallucinations: None  Ideas of Reference:None  Suicidal Thoughts:Suicidal Thoughts: No  Homicidal  Thoughts:Homicidal Thoughts: No  Sensorium  Memory: Immediate Good; Recent Good  Judgment: Fair  Insight: Fair  Art therapist  Concentration: Good  Attention Span: Good  Recall: Iona Manis of Knowledge: Fair  Language: Good  Psychomotor Activity  Psychomotor Activity: Psychomotor Activity: Normal  Assets  Assets: Communication Skills; Physical Health; Resilience; Desire for Improvement; Housing; Social Support  Sleep  Sleep: Sleep: Good Number of Hours of Sleep: 6.75  Physical Exam: Physical Exam Vitals and nursing note reviewed.  Constitutional:      Appearance: She is obese.  HENT:     Head: Normocephalic.     Right Ear: External ear normal.     Left Ear: External ear normal.     Nose: Nose normal.     Mouth/Throat:     Mouth: Mucous membranes are moist.     Pharynx: Oropharynx is clear.  Eyes:     Extraocular Movements: Extraocular movements intact.  Cardiovascular:     Rate and Rhythm: Normal rate.     Pulses: Normal pulses.  Pulmonary:     Effort: Pulmonary effort is normal. No respiratory distress.  Abdominal:     Comments: Deferred  Genitourinary:    Comments: Deferred Musculoskeletal:        General: Normal range of motion.     Cervical back: Normal range of motion.  Skin:    General: Skin is warm.  Neurological:     General: No focal deficit present.     Mental Status: She is alert and oriented to person, place, and time.  Psychiatric:        Mood and Affect: Mood normal.        Behavior: Behavior normal.    Review of Systems  Constitutional:  Negative for chills and fever.  HENT:  Negative for sore throat.   Eyes:  Negative for blurred vision.  Respiratory:  Negative for cough, sputum production, shortness of breath and wheezing.   Cardiovascular:  Negative for chest pain and palpitations.  Gastrointestinal:  Negative for heartburn and nausea.  Genitourinary:  Negative for dysuria and urgency.  Musculoskeletal:   Negative for falls.  Skin:  Negative for rash.  Neurological:  Negative for dizziness and headaches.  Endo/Heme/Allergies:        See allergy listing  Psychiatric/Behavioral:  Positive for depression. Negative for hallucinations, substance abuse and suicidal ideas. The patient is nervous/anxious. The patient does not have insomnia.    Blood pressure 122/67, pulse 69, temperature 98.1 F (36.7 C), temperature source Oral, resp. rate 18, height 5\' 4"  (1.626 m), weight 111.6 kg, last menstrual period 01/24/2013, SpO2 96%. Body mass index is 42.23 kg/m.  Treatment Plan Summary: Daily contact with patient to assess and evaluate symptoms and progress in treatment and Medication management Principal/active diagnoses.  Schizoaffective disorder, bipolar type.  Plan: The risks/benefits/side-effects/alternatives to the medications in use were discussed in detail with the patient and time was given for patient's questions. The patient consents to medication trial.    -Discontinue Zyprexa  5 mg po Q hs for psychosis 01/02/24. - Continue Latuda 40 mg po at bedtime for psychosis -Continue Melatonin 10 mg po Q hs.   Other PRNS -Continue Maalox 30 ml Q 4 hrs PRN for indigestion -Continue MOM 30 ml po Q 6 hrs for constipation   Safety and Monitoring: Voluntary admission to inpatient psychiatric unit for safety, stabilization and treatment Daily contact with patient to assess and evaluate symptoms and progress in treatment Patient's case to be discussed in multi-disciplinary team meeting Observation Level : q15 minute checks Vital signs: q12  hours Precautions: Safety   Discharge Planning: Social work and case management to assist with discharge planning and identification of hospital follow-up needs prior to discharge Estimated LOS: 5-7 days Discharge Concerns: Need to establish a safety plan; Medication compliance and effectiveness Discharge Goals: Return home with outpatient referrals for mental  health follow-up including medication management/psychotherapy    Physician Treatment Plan for Primary Diagnosis: Schizoaffective disorder, bipolar type (HCC)   Long Term Goal(s): Improvement in symptoms so as ready for discharge   Short Term Goals: Ability to identify changes in lifestyle to reduce recurrence of condition will improve, Ability to verbalize feelings will improve, Ability to disclose and discuss suicidal ideas, and Ability to demonstrate self-control will improve   Physician Treatment Plan for Secondary Diagnosis: Principal Problem:   Schizoaffective disorder, bipolar type (HCC)   Long Term Goal(s): Improvement in symptoms so as ready for discharge   Short Term Goals: Ability to identify and develop effective coping behaviors will improve, Ability to maintain clinical measurements within normal limits will improve, and Compliance with prescribed medications will improve   I certify that inpatient services furnished can reasonably be expected to improve the patient's condition.  There open and unit  Laurence Pons, FNP 01/03/2024, 1:43 PM Patient ID: [name], female   DOB: 03-04-1966, 58 y.o.   MRN: [id number]

## 2024-01-03 NOTE — Plan of Care (Signed)
   Problem: Education: Goal: Emotional status will improve Outcome: Progressing Goal: Mental status will improve Outcome: Progressing   Problem: Activity: Goal: Interest or engagement in activities will improve Outcome: Progressing

## 2024-01-03 NOTE — Progress Notes (Signed)
   01/03/24 1700  Psych Admission Type (Psych Patients Only)  Admission Status Voluntary  Psychosocial Assessment  Patient Complaints Anxiety;Worrying  Eye Contact Fair  Facial Expression Anxious  Affect Anxious;Preoccupied  Speech Logical/coherent  Interaction Assertive  Motor Activity Slow  Appearance/Hygiene Layered clothes  Behavior Characteristics Cooperative;Anxious  Mood Suspicious;Preoccupied  Thought Process  Coherency Circumstantial  Content Delusions  Delusions Paranoid  Perception WDL  Hallucination None reported or observed  Judgment Impaired  Confusion Mild  Danger to Self  Current suicidal ideation? Denies  Danger to Others  Danger to Others None reported or observed

## 2024-01-04 LAB — GLUCOSE, CAPILLARY
Glucose-Capillary: 95 mg/dL (ref 70–99)
Glucose-Capillary: 96 mg/dL (ref 70–99)
Glucose-Capillary: 113 mg/dL — ABNORMAL HIGH (ref 70–99)
Glucose-Capillary: 145 mg/dL — ABNORMAL HIGH (ref 70–99)

## 2024-01-04 NOTE — Group Note (Signed)
 Recreation Therapy Group Note   Group Topic:Coping Skills  Group Date: 01/04/2024 Start Time: 1015 End Time: 1050 Facilitators: Lc Joynt-McCall, LRT,CTRS Location: [address] Dayroom   Group Topic: Coping Skills  Goal Area(s) Addresses:  Patient will identify positive coping techniques. Patient will identify benefits of using coping skills post d/c.  Behavioral Response: Engaged  Intervention: Worksheet, Brain storming  Activity: Mind Map. Patient was provided a blank template of a diagram with 32 blank boxes in a tiered system, branching from the center (similar to a bubble chart). LRT directed patients to label the middle of the diagram "Coping Skills" and consider 8 (anger, sadness, hopeless, fear, confusion, lost, nervous and physical pain) different sources in which coping skills would be used . Pt were directed to record them in the 2nd tier boxes closest to the center. Patients were to then come up with 3 effective techniques to address each identified area in the remaining boxes stemming from a particular source.   Education: Pharmacologist, Building control surveyor.   Education Outcome:  Acknowledges Education   Affect/Mood: Appropriate   Participation Level: Engaged   Participation Quality: Independent   Behavior: Appropriate   Speech/Thought Process: Focused   Insight: Good   Judgement: Good   Modes of Intervention: Worksheet   Patient Response to Interventions:  Engaged   Education Outcome:  In group clarification offered    Clinical Observations/Individualized Feedback: Pt came in late to group but LRT helped catch pt caught up. Pt was interactive and focused during group as well. Pt identified coping skills like social engagement, exercise, relaxation, therapeutic concentration, music fluctuation, meditation, reconcile and deep breathing.    Plan: Continue to engage patient in RT group sessions 2-3x/week.   Dandra Shambaugh-McCall, LRT,CTRS 01/04/2024  1:53 PM

## 2024-01-04 NOTE — Progress Notes (Signed)
   01/04/24 1100  Psych Admission Type (Psych Patients Only)  Admission Status Voluntary  Psychosocial Assessment  Patient Complaints Worrying  Eye Contact Brief  Facial Expression Worried  Affect Appropriate to circumstance  Speech Logical/coherent  Interaction Assertive  Motor Activity Slow  Appearance/Hygiene Unremarkable  Behavior Characteristics Appropriate to situation  Mood Pleasant  Thought Process  Coherency Circumstantial  Content WDL  Delusions Paranoid  Perception WDL  Hallucination None reported or observed  Judgment Impaired  Confusion None  Danger to Self  Current suicidal ideation? Denies  Danger to Others  Danger to Others None reported or observed

## 2024-01-04 NOTE — [facility] Suicide Risk Assessment (Signed)
 [facility] INPATIENT:  Family/Significant Other Suicide Prevention Education  Suicide Prevention Education:  Education Completed; [name] (husband) [phone],  (name of family member/significant other) has been identified by the patient as the family member/significant other with whom the patient will be residing, and identified as the person(s) who will aid the patient in the event of a mental health crisis (suicidal ideations/suicide attempt).  With written consent from the patient, the family member/significant other has been provided the following suicide prevention education, prior to the and/or following the discharge of the patient.  Husband said that he will pick up patient upon discharge.  He said that he usually gets off work at 3:00 PM and will be available after that time.  If he is unavailable to come, his mother is also available to pick up patient.  Husband said that they don't have any guns or weapons.  He said he will secure medication and sharp objects (such as knives and scissors).  He doesn't have ant safety concerns about patient returning home.  He said they have 3 cats and 3 dogs, and their pats miss patient.  The following people live in the home:  husband and patient.  When asked if patient is doing better, he said that he just spoke with patient on the phone.  "I didn't see anything wrong with her when she came over there."  The suicide prevention education provided includes the following: Suicide risk factors Suicide prevention and interventions National Suicide Hotline telephone number [facility] assessment telephone number [facility] Emergency Assistance 911 [facility] and/or Residential Mobile Crisis Unit telephone number  Request made of family/significant other to: Remove weapons (e.g., guns, rifles, knives), all items previously/currently identified as safety concern.   Remove drugs/medications (over-the-counter, prescriptions, illicit  drugs), all items previously/currently identified as a safety concern.  The family member/significant other verbalizes understanding of the suicide prevention education information provided.  The family member/significant other agrees to remove the items of safety concern listed above.  [name] [name], LCSWA 01/04/2024, 7:23 PM

## 2024-01-04 NOTE — Progress Notes (Signed)
   01/04/24 2035  Psych Admission Type (Psych Patients Only)  Admission Status Voluntary  Psychosocial Assessment  Patient Complaints None (says she feels good today)  Eye Contact Fair  Facial Expression Anxious  Affect Appropriate to circumstance  Speech Logical/coherent  Interaction Assertive  Motor Activity Slow  Appearance/Hygiene Unremarkable  Behavior Characteristics Cooperative;Appropriate to situation  Mood Pleasant  Thought Process  Coherency WDL  Content WDL  Delusions Paranoid  Perception WDL  Hallucination None reported or observed  Judgment Impaired  Confusion None  Danger to Self  Current suicidal ideation? Denies  Danger to Others  Danger to Others None reported or observed   Progress note   D: Pt seen in dayroom. Pt denies SI, HI, AVH. Pt rates pain  0/10. Pt rates anxiety  0/10 and depression  0/10. States she has kept busy today and that has improved her mood and decreased her anxiety. Attended groups, eating and sleeping well. No other concerns noted at this time.  A: Pt provided support and encouragement. Pt given scheduled medication as prescribed. PRNs as appropriate. Q15 min checks for safety.   R: Pt safe on the unit. Will continue to monitor.

## 2024-01-04 NOTE — Plan of Care (Signed)
   Problem: Education: Goal: Emotional status will improve Outcome: Progressing Goal: Mental status will improve Outcome: Progressing

## 2024-01-04 NOTE — Progress Notes (Signed)
 Recreation Therapy Notes  INPATIENT RECREATION THERAPY ASSESSMENT  Patient Details Name: [name] MRN: [id number] DOB: June 13, 1966 Today's Date: 01/04/2024       Information Obtained From: Patient  Able to Participate in Assessment/Interview: Yes  Patient Presentation: Alert  Reason for Admission (Per Patient): Other (Comments) (pt stated she had an allergy reaction)  Patient Stressors:  (None identified)  Coping Skills:   TV, Sports, Exercise, Music, Deep Breathing, Meditate, Talk, Art, Prayer, Avoidance, Read, Hot Bath/Shower  Leisure Interests (2+):  Individual - Other (Comment) (spend time with her animals, be in good company (events))  Frequency of Recreation/Participation: Other (Comment) (spend time with animals-daily; good company-frequently)  Awareness of Community Resources:  Yes  Community Resources:  Library, Cytogeneticist (Comment) Banker)  Current Use: Yes  If no, Barriers?:    Expressed Interest in [employer] Information: No  [employer] of Residence:  Arboriculturist  Patient Main Form of Transportation: Set designer (Husband and family members)  Patient Strengths:  Family/friends; hope/encouragement  Patient Identified Areas of Improvement:  Spending more time with friends and relatives  Patient Goal for Hospitalization:  "maintain passing of allergy infection"  Current SI (including self-harm):  No  Current HI:  No  Current AVH: No  Staff Intervention Plan: Group Attendance, Collaborate with Interdisciplinary Treatment Team  Consent to Intern Participation: N/A   Colleen Kotlarz-McCall, LRT,CTRS Zeena Starkel A Yana Schorr-McCall 01/04/2024, 2:45 PM

## 2024-01-04 NOTE — Group Note (Signed)
 Date:  01/04/2024 Time:  8:40 PM  Group Topic/Focus:  Wrap-Up Group:   The focus of this group is to help patients review their daily goal of treatment and discuss progress on daily workbooks.    Participation Level:  Active  Participation Quality:  Appropriate  Affect:  Appropriate  Cognitive:  Appropriate  Insight: Appropriate  Engagement in Group:  Engaged  Modes of Intervention:  Education and Exploration  Additional Comments:  Patient attended and participated in group tonight.  She reports that her goal for today was to have less anxiety.  She meet her goal by having less anxiety talking to peers.  [name] 01/04/2024, 8:40 PM

## 2024-01-04 NOTE — Progress Notes (Signed)
 Patient remains invested with her delusions of being exposed to radiation.  Preoccupied with scorpion poison in her system which selectively predisposes her to the radiation.  Patient has no insight as she does not believe that this is in her mind.  I have encouraged her to think through initiating lurasidone as it would help her feel better.  We will continue to motivate her towards treatment.  [facility] MD Progress Note  01/04/2024 6:26 PM [name]  MRN:  [id number]  Principal Problem: Schizoaffective disorder, bipolar type (HCC) Diagnosis: Principal Problem:   Schizoaffective disorder, bipolar type [facility])  Reason for admission:   This is an admission evaluation for this 58 year old Caucasian female with history of schizoaffective disorder and probably substance use disorder. Patient has been admitted/treated for mental health related issues remotely and this [facility]. Chart review has indicated that the last time patient was in this hospital was 2015, almost 10 years ago.  At the time, she was treated for schizoaffective disorder.  At the time patient was discharged on BuSpar  10 mg, Tegretol  XR 200 mg, olanzapine  5 mg and trazodone  50 mg.  Patient has no other record of psychiatric hospitalizations since 2015.  However, she is being admitted to this [facility] this time from the [facility] in [place] .  Chart review indicated that patient was a police drop-off with complaint of psychosis as she was feeling like the radioactive trash in her neighborhood may be poisoning her. A review of her current lab results has shown basically normal levels. However, will obtain, lipid panel.  Her toxicology & UDS results were clear.   24-hour chart reviewed: Vital signs reviewed without critical values.  Compliant with psychotropic medications.  No as needed required. No agitation protocol required.  Today's assessment notes: During assessment today, [name] reports that her mood is less  depressed.  Rates depression as number is 2/10 with 10 being high severity.  She is alert, pleasant, cooperative, and oriented to person, place, time, however not situation.  Patient reports "the radiation, is slowly leaving my body and I am ready to leave the hospital to go home and help her husband. Chart reviewed and findings shared with the treatment team and consult with attending psychiatrist, with recommendation to continue Latuda 40 mg p.o. at bedtime with food for her psychosis.  Patient is compliant with Latuda without any side effects of muscle stiffness, restlessness, weakness or slow movement.  She denies suicidal ideation, homicidal ideation or AVH.  No changes made to her treatment plan today, patient responding well to Latuda and we will continue to monitor effectiveness.  Reports that anxiety is at manageable level and rates as #2/10, with 10 being high severity Sleep is improving, reports sleeping 6 hours throughout the night. Appetite is good Concentration is improving Energy level is adequate Denies suicidal thoughts.  Further denies suicidal intent and plan.  Denies having any HI.  Denies having psychotic symptoms, however continues with delusional preoccupation  Denies having side effects to current psychiatric medications.   We discussed compliance to current medication regimen.  Total Time spent with patient: 45 minutes  Past Psychiatric History:  Schizoaffective disorder, stimulant use disorder.   Past Medical History:  Past Medical History:  Diagnosis Date   Anxiety    Bipolar 1 disorder (HCC)    pt denies, says was misdiagnosed   CHF (congestive heart failure) (HCC)    Chronic back pain    Chronic chest wall pain    Chronic right  shoulder pain    Diabetes mellitus    Fibromyalgia    Hypertension    Panic attack    Psychotic disorder (HCC)    Schizoaffective disorder (HCC)     Past Surgical History:  Procedure Laterality Date   CHOLECYSTECTOMY      MANDIBLE SURGERY     Family History:  Family History  Problem Relation Age of Onset   Heart failure Mother    Heart failure Other    Family Psychiatric  History: See H&P Social History:  Social History   Substance and Sexual Activity  Alcohol Use No     Social History   Substance and Sexual Activity  Drug Use Yes   Types: Marijuana    Social History   Socioeconomic History   Marital status: Legally Separated    Spouse name: Not on file   Number of children: Not on file   Years of education: Not on file   Highest education level: Not on file  Occupational History   Not on file  Tobacco Use   Smoking status: Every Day    Current packs/day: 1.00    Average packs/day: 1 pack/day for 30.0 years (30.0 ttl pk-yrs)    Types: Cigarettes   Smokeless tobacco: Never  Substance and Sexual Activity   Alcohol use: No   Drug use: Yes    Types: Marijuana   Sexual activity: Yes    Birth control/protection: None  Other Topics Concern   Not on file  Social History Narrative   Not on file   Social Drivers of Health   Financial Resource Strain: Not on file  Food Insecurity: No Food Insecurity (12/31/2023)   Hunger Vital Sign    Worried About Running Out of Food in the Last Year: Never true    Ran Out of Food in the Last Year: Never true  Transportation Needs: No Transportation Needs (12/31/2023)   PRAPARE - Administrator, Civil Service (Medical): No    Lack of Transportation (Non-Medical): No  Physical Activity: Not on file  Stress: Not on file  Social Connections: Not on file   Additional Social History:     Sleep: Good  Appetite:  Good  Current Medications: Current Facility-Administered Medications  Medication Dose Route Frequency Provider Last Rate Last Admin   alum & mag hydroxide-simeth (MAALOX/MYLANTA) 200-200-20 MG/5ML suspension 30 mL  30 mL Oral Q4H PRN Lewis, Tanika N, NP       insulin  aspart (novoLOG ) injection 0-5 Units  0-5 Units Subcutaneous  QHS Levester Reagin, NP       insulin  aspart (novoLOG ) injection 0-9 Units  0-9 Units Subcutaneous TID WC Lewis, Tanika N, NP       lurasidone (LATUDA) tablet 40 mg  40 mg Oral QHS Jerek Meulemans C, FNP   40 mg at 01/03/24 2106   magnesium  hydroxide (MILK OF MAGNESIA) suspension 30 mL  30 mL Oral Daily PRN Levester Reagin, NP   30 mL at 01/01/24 1201   melatonin tablet 10 mg  10 mg Oral QHS Dorthea Gauze, NP   10 mg at 01/03/24 2106   OLANZapine  (ZYPREXA ) injection 10 mg  10 mg Intramuscular TID PRN Levester Reagin, NP       OLANZapine  (ZYPREXA ) injection 5 mg  5 mg Intramuscular TID PRN Levester Reagin, NP       OLANZapine  zydis (ZYPREXA ) disintegrating tablet 5 mg  5 mg Oral TID PRN Levester Reagin, NP  Lab Results:  Results for orders placed or performed during the hospital encounter of 12/31/23 (from the past 48 hours)  Glucose, capillary     Status: Abnormal   Collection Time: 01/02/24  7:41 PM  Result Value Ref Range   Glucose-Capillary 175 (H) 70 - 99 mg/dL    Comment: Glucose reference range applies only to samples taken after fasting for at least 8 hours.  Glucose, capillary     Status: None   Collection Time: 01/03/24  6:21 AM  Result Value Ref Range   Glucose-Capillary 99 70 - 99 mg/dL    Comment: Glucose reference range applies only to samples taken after fasting for at least 8 hours.  Glucose, capillary     Status: Abnormal   Collection Time: 01/03/24 11:52 AM  Result Value Ref Range   Glucose-Capillary 103 (H) 70 - 99 mg/dL    Comment: Glucose reference range applies only to samples taken after fasting for at least 8 hours.  Glucose, capillary     Status: Abnormal   Collection Time: 01/03/24  5:07 PM  Result Value Ref Range   Glucose-Capillary 119 (H) 70 - 99 mg/dL    Comment: Glucose reference range applies only to samples taken after fasting for at least 8 hours.  Glucose, capillary     Status: Abnormal   Collection Time: 01/03/24  8:02 PM  Result Value Ref Range    Glucose-Capillary 151 (H) 70 - 99 mg/dL    Comment: Glucose reference range applies only to samples taken after fasting for at least 8 hours.  Glucose, capillary     Status: None   Collection Time: 01/04/24  6:19 AM  Result Value Ref Range   Glucose-Capillary 95 70 - 99 mg/dL    Comment: Glucose reference range applies only to samples taken after fasting for at least 8 hours.  Glucose, capillary     Status: None   Collection Time: 01/04/24 12:01 PM  Result Value Ref Range   Glucose-Capillary 96 70 - 99 mg/dL    Comment: Glucose reference range applies only to samples taken after fasting for at least 8 hours.  Glucose, capillary     Status: Abnormal   Collection Time: 01/04/24  5:11 PM  Result Value Ref Range   Glucose-Capillary 113 (H) 70 - 99 mg/dL    Comment: Glucose reference range applies only to samples taken after fasting for at least 8 hours.   Blood Alcohol level:  Lab Results  Component Value Date   [facility] <15 12/30/2023   ETH <5 08/07/2015   Metabolic Disorder Labs: Lab Results  Component Value Date   HGBA1C 4.8 12/30/2023   MPG 91.06 12/30/2023   MPG 301 08/26/2014   No results found for: "PROLACTIN" No results found for: "CHOL", "TRIG", "HDL", "CHOLHDL", "VLDL", "LDLCALC"  Physical Findings: AIMS:  , ,  ,  ,    CIWA:    COWS:     Musculoskeletal: Strength & Muscle Tone: within normal limits Gait & Station: normal Patient leans: N/A  Psychiatric Specialty Exam:  Presentation  General Appearance:  Appropriate for Environment; Casual; Disheveled  Eye Contact: Good  Speech: Clear and Coherent  Speech Volume: Normal  Handedness: Right  Mood and Affect  Mood: Anxious; Depressed  Affect: Congruent  Thought Process  Thought Processes: Coherent  Descriptions of Associations:Intact  Orientation:Full (Time, Place and Person)  Thought Content:Logical; Paranoid Ideation; Delusions  History of Schizophrenia/Schizoaffective  disorder:Yes  Duration of Psychotic Symptoms:Greater than six months  Hallucinations:Hallucinations:  None  Ideas of Reference:None  Suicidal Thoughts:Suicidal Thoughts: No  Homicidal Thoughts:Homicidal Thoughts: No  Sensorium  Memory: Immediate Good; Recent Good  Judgment: Fair  Insight: Fair  Chartered certified accountant: Fair  Attention Span: Fair  Recall: Fair  Fund of Knowledge: Fair  Language: Good  Psychomotor Activity  Psychomotor Activity: Psychomotor Activity: Normal  Assets  Assets: Communication Skills; Desire for Improvement; Physical Health; Resilience  Sleep  Sleep: Sleep: Good Number of Hours of Sleep: 8  Physical Exam: Physical Exam Vitals and nursing note reviewed.  Constitutional:      Appearance: She is obese.  HENT:     Head: Normocephalic.     Right Ear: External ear normal.     Left Ear: External ear normal.     Nose: Nose normal.     Mouth/Throat:     Mouth: Mucous membranes are moist.     Pharynx: Oropharynx is clear.  Eyes:     Extraocular Movements: Extraocular movements intact.  Cardiovascular:     Rate and Rhythm: Normal rate.     Pulses: Normal pulses.  Pulmonary:     Effort: Pulmonary effort is normal. No respiratory distress.  Abdominal:     Comments: Deferred  Genitourinary:    Comments: Deferred Musculoskeletal:        General: Normal range of motion.     Cervical back: Normal range of motion.  Skin:    General: Skin is warm.  Neurological:     General: No focal deficit present.     Mental Status: She is alert and oriented to person, place, and time.  Psychiatric:        Mood and Affect: Mood normal.        Behavior: Behavior normal.    Review of Systems  Constitutional:  Negative for chills and fever.  HENT:  Negative for sore throat.   Eyes:  Negative for blurred vision.  Respiratory:  Negative for cough, sputum production, shortness of breath and wheezing.   Cardiovascular:  Negative  for chest pain and palpitations.  Gastrointestinal:  Negative for heartburn and nausea.  Genitourinary:  Negative for dysuria and urgency.  Musculoskeletal:  Negative for falls.  Skin:  Negative for rash.  Neurological:  Negative for dizziness and headaches.  Endo/Heme/Allergies:        See allergy listing  Psychiatric/Behavioral:  Positive for depression. Negative for hallucinations, substance abuse and suicidal ideas. The patient is nervous/anxious. The patient does not have insomnia.    Blood pressure 136/62, pulse 70, temperature 97.9 F (36.6 C), temperature source Oral, resp. rate 20, height 5\' 4"  (1.626 m), weight 111.6 kg, last menstrual period 01/24/2013, SpO2 97%. Body mass index is 42.23 kg/m.  Treatment Plan Summary: Daily contact with patient to assess and evaluate symptoms and progress in treatment and Medication management Principal/active diagnoses.  Schizoaffective disorder, bipolar type.  Plan: The risks/benefits/side-effects/alternatives to the medications in use were discussed in detail with the patient and time was given for patient's questions. The patient consents to medication trial.    -Discontinue Zyprexa  5 mg po Q hs for psychosis 01/02/24. - Continue Latuda 40 mg po at bedtime for psychosis -Continue Melatonin 10 mg po Q hs.   Other PRNS -Continue Maalox 30 ml Q 4 hrs PRN for indigestion -Continue MOM 30 ml po Q 6 hrs for constipation   Safety and Monitoring: Voluntary admission to inpatient psychiatric unit for safety, stabilization and treatment Daily contact with patient to assess and evaluate symptoms and  progress in treatment Patient's case to be discussed in multi-disciplinary team meeting Observation Level : q15 minute checks Vital signs: q12 hours Precautions: Safety   Discharge Planning: Social work and case management to assist with discharge planning and identification of hospital follow-up needs prior to discharge Estimated LOS: 5-7  days Discharge Concerns: Need to establish a safety plan; Medication compliance and effectiveness Discharge Goals: Return home with outpatient referrals for mental health follow-up including medication management/psychotherapy    Physician Treatment Plan for Primary Diagnosis: Schizoaffective disorder, bipolar type (HCC)   Long Term Goal(s): Improvement in symptoms so as ready for discharge   Short Term Goals: Ability to identify changes in lifestyle to reduce recurrence of condition will improve, Ability to verbalize feelings will improve, Ability to disclose and discuss suicidal ideas, and Ability to demonstrate self-control will improve   Physician Treatment Plan for Secondary Diagnosis: Principal Problem:   Schizoaffective disorder, bipolar type (HCC)   Long Term Goal(s): Improvement in symptoms so as ready for discharge   Short Term Goals: Ability to identify and develop effective coping behaviors will improve, Ability to maintain clinical measurements within normal limits will improve, and Compliance with prescribed medications will improve   I certify that inpatient services furnished can reasonably be expected to improve the patient's condition.  There open and unit  Laurence Pons, FNP 01/04/2024, 6:26 PM Patient ID: [name], female   DOB: [birth year], 58 y.o.   MRN: [id number] Patient ID: ROSLIN NORWOOD, female   DOB: [birth year], 58 y.o.   MRN: [id number]

## 2024-01-04 NOTE — [facility] Group Notes (Signed)
 Adult Psychoeducational Group Note  Date:  01/04/2024 Time:  10:04 AM  Group Topic/Focus:  Goals Group:   The focus of this group is to help patients establish daily goals to achieve during treatment and discuss how the patient can incorporate goal setting into their daily lives to aide in recovery. Orientation:   The focus of this group is to educate the patient on the purpose and policies of crisis stabilization and provide a format to answer questions about their admission.  The group details unit policies and expectations of patients while admitted.  Participation Level:  Active  Participation Quality:  Appropriate  Affect:  Appropriate  Cognitive:  Appropriate  Insight: Appropriate  Engagement in Group:  Engaged  Modes of Intervention:  Discussion  Additional Comments:  Pt attended the goals group and remained appropriate and engaged throughout the duration of the group.   Chinara Hertzberg O 01/04/2024, 10:04 AM

## 2024-01-04 NOTE — Group Note (Signed)
 LCSW Group Therapy Note   Group Date: 01/04/2024 Start Time: 1100 End Time: 1200  Participation:  patient was present and actively participated in the discussion.   Type of Therapy:  Group Therapy  Topic:  Speaking from the Heart: Communicating with Understanding and Empathy  Objective:  To help participants develop effective communication skills to express themselves clearly, listen actively, and navigate conflicts in a healthy way.  Goals: Increase awareness of verbal and non-verbal communication skills. Practice using "I" statements and active listening techniques. Learn coping strategies for managing communication stress.  Summary:  Participants explored the importance of communication, discussed challenges, and practiced skills such as active listening and assertive expression. They reflected on past experiences and identified ways to improve communication in their daily lives.  Therapeutic Modalities: Cognitive-Behavioral Therapy (CBT): Restructuring negative thought patterns in communication. Mindfulness: Staying present and calm during conversations. Psychoeducation: Learning about effective communication techniques.   Amirah Goerke O Giamarie Bueche, LCSWA 01/04/2024  6:38 PM

## 2024-01-05 LAB — HIV ANTIBODY (ROUTINE TESTING W REFLEX): HIV Screen 4th Generation wRfx: NONREACTIVE

## 2024-01-05 LAB — LIPID PANEL
Cholesterol: 139 mg/dL (ref 0–200)
HDL: 42 mg/dL (ref >40)
LDL Cholesterol: 76 mg/dL (ref 0–99)
Total CHOL/HDL Ratio: 3.3 ratio
Triglycerides: 103 mg/dL (ref <150)
VLDL: 21 mg/dL (ref 0–40)

## 2024-01-05 LAB — TSH: TSH: 3.125 u[IU]/mL (ref 0.350–4.500)

## 2024-01-05 LAB — FOLATE: Folate: 5.6 ng/mL — ABNORMAL LOW (ref >5.9)

## 2024-01-05 LAB — VITAMIN D 25 HYDROXY (VIT D DEFICIENCY, FRACTURES): Vit D, 25-Hydroxy: 19.77 ng/mL — ABNORMAL LOW (ref 30–100)

## 2024-01-05 LAB — GLUCOSE, CAPILLARY
Glucose-Capillary: 106 mg/dL — ABNORMAL HIGH (ref 70–99)
Glucose-Capillary: 112 mg/dL — ABNORMAL HIGH (ref 70–99)
Glucose-Capillary: 108 mg/dL — ABNORMAL HIGH (ref 70–99)
Glucose-Capillary: 168 mg/dL — ABNORMAL HIGH (ref 70–99)

## 2024-01-05 LAB — VITAMIN B12: Vitamin B-12: 177 pg/mL — ABNORMAL LOW (ref 180–914)

## 2024-01-05 NOTE — Group Note (Signed)
 Date:  01/05/2024 Time:  9:03 PM  Group Topic/Focus:  Wrap-Up Group:   The focus of this group is to help patients review their daily goal of treatment and discuss progress on daily workbooks.    Participation Level:  Active  Participation Quality:  Appropriate  Affect:  Appropriate  Cognitive:  Alert and Appropriate  Insight: Appropriate  Engagement in Group:  Developing/Improving  Modes of Intervention:  Discussion  Additional Comments:  Pt stated her goal for today was to focus on her treatment plan. Pt stated she accomplished her goal today. Pt stated she talked with her doctor and with her social worker about her care today. Pt rated her overall day a 8 out of 10. Pt stated she was able to contact her husband today which improved her overall day. Pt stated she felt better about herself tonight. Pt stated she was able to attend all meals today. Pt stated she took all medications provided today. Pt stated she loved that her blood sugar has been in normal rang all day. Pt stated her appetite was pretty good today. Pt rated her sleep last night was pretty good. Pt stated the goal tonight was to get some rest. Pt stated she had no physical pain tonight. Pt deny visual hallucinations and auditory issues tonight. Pt denies thoughts of harming herself or others. Pt stated she would alert staff if anything changed.  Dwaine Gip 01/05/2024, 9:03 PM

## 2024-01-05 NOTE — Progress Notes (Signed)
 Patient remains invested with her delusions of being exposed to radiation.  Preoccupied with scorpion poison in her system which selectively predisposes her to the radiation.  Patient has no insight as she does not believe that this is in her mind.  I have encouraged her to think through initiating lurasidone as it would help her feel better.  We will continue to motivate her towards treatment.  [facility] MD Progress Note  01/05/2024 5:09 PM [name]  MRN:  [id number]  Principal Problem: Schizoaffective disorder, bipolar type [facility] [place]) Diagnosis: Principal Problem:   Schizoaffective disorder, bipolar type [facility])  Reason for admission:   This is an admission evaluation for this 58 year old Caucasian female with history of schizoaffective disorder and probably substance use disorder. Patient has been admitted/treated for mental health related issues remotely and this [facility]. Chart review has indicated that the last time patient was in this hospital was 2015, almost 10 years ago.  At the time, she was treated for schizoaffective disorder.  At the time patient was discharged on BuSpar  10 mg, Tegretol  XR 200 mg, olanzapine  5 mg and trazodone  50 mg.  Patient has no other record of psychiatric hospitalizations since 2015.  However, she is being admitted to this [facility] this time from the [facility] in [place] .  Chart review indicated that patient was a police drop-off with complaint of psychosis as she was feeling like the radioactive trash in her neighborhood may be poisoning her. A review of her current lab results has shown basically normal levels. However, will obtain, lipid panel.  Her toxicology & UDS results were clear.   24-hour chart reviewed: Vital signs reviewed without critical values.  Compliant with psychotropic medications.  No as needed required. No agitation protocol required.  Today's assessment notes: [name] reports that her mood is less depressed.  and pleasant, rates  depression as number is 1/10 with 10 being high severity.  She is alert, pleasant, cooperative, and oriented to person, place, time, however not situation.  Patient reports ,  I do not have the   scorpion in my system anymore.  I believe the air is cleared up and my husband told me that cleaning up the area.  I may be going home soon to joint with my husband d.  I am beginning to feel better.   Chart reviewed    and findings shared with the treatment team and consult with attending psychiatrist, with recommendation to continue Latuda 40 mg p.o. at bedtime with food for her psychosis.  Patient is compliant with Latuda without any side effects of muscle stiffness, restlessness, weakness or slow movement.  She denies suicidal ideation, homicidal ideation or AVH.  No changes made to her treatment plan today, patient responding well to Latuda and we will continue to monitor effectiveness.  Reports that anxiety is at manageable level and rates as #2/10, with 10 being high severity Sleep is improving, reports sleeping 6 hours throughout the night. Appetite is good Concentration is improving Energy level is adequate Denies suicidal thoughts.  Further denies suicidal intent and plan.  Denies having any HI.  Denies having psychotic symptoms, however continues with delusional preoccupation  Denies having side effects to current psychiatric medications.   We discussed compliance to current medication regimen.  Total Time spent with patient: 45 minutes  Past Psychiatric History:  Schizoaffective disorder, stimulant use disorder.   Past Medical History:  Past Medical History:  Diagnosis Date   Anxiety    Bipolar 1 disorder (  HCC)    pt denies, says was misdiagnosed   CHF (congestive heart failure) (HCC)    Chronic back pain    Chronic chest wall pain    Chronic right shoulder pain    Diabetes mellitus    Fibromyalgia    Hypertension    Panic attack    Psychotic disorder (HCC)    Schizoaffective  disorder (HCC)     Past Surgical History:  Procedure Laterality Date   CHOLECYSTECTOMY     MANDIBLE SURGERY     Family History:  Family History  Problem Relation Age of Onset   Heart failure Mother    Heart failure Other    Family Psychiatric  History: See H&P Social History:  Social History   Substance and Sexual Activity  Alcohol Use No     Social History   Substance and Sexual Activity  Drug Use Yes   Types: Marijuana    Social History   Socioeconomic History   Marital status: Legally Separated    Spouse name: Not on file   Number of children: Not on file   Years of education: Not on file   Highest education level: Not on file  Occupational History   Not on file  Tobacco Use   Smoking status: Every Day    Current packs/day: 1.00    Average packs/day: 1 pack/day for 30.0 years (30.0 ttl pk-yrs)    Types: Cigarettes   Smokeless tobacco: Never  Substance and Sexual Activity   Alcohol use: No   Drug use: Yes    Types: Marijuana   Sexual activity: Yes    Birth control/protection: None  Other Topics Concern   Not on file  Social History Narrative   Not on file   Social Drivers of Health   Financial Resource Strain: Not on file  Food Insecurity: No Food Insecurity (12/31/2023)   Hunger Vital Sign    Worried About Running Out of Food in the Last Year: Never true    Ran Out of Food in the Last Year: Never true  Transportation Needs: No Transportation Needs (12/31/2023)   PRAPARE - Administrator, Civil Service (Medical): No    Lack of Transportation (Non-Medical): No  Physical Activity: Not on file  Stress: Not on file  Social Connections: Not on file   Additional Social History:     Sleep: Good  Appetite:  Good  Current Medications: Current Facility-Administered Medications  Medication Dose Route Frequency Provider Last Rate Last Admin   alum & mag hydroxide-simeth (MAALOX/MYLANTA) 200-200-20 MG/5ML suspension 30 mL  30 mL Oral Q4H PRN  Lewis, Tanika N, NP       insulin  aspart (novoLOG ) injection 0-5 Units  0-5 Units Subcutaneous QHS Lewis, Verner Goltz, NP       insulin  aspart (novoLOG ) injection 0-9 Units  0-9 Units Subcutaneous TID WC Lewis, Tanika N, NP       lurasidone (LATUDA) tablet 40 mg  40 mg Oral QHS Vannia Pola C, FNP   40 mg at 01/04/24 2047   magnesium  hydroxide (MILK OF MAGNESIA) suspension 30 mL  30 mL Oral Daily PRN Levester Reagin, NP   30 mL at 01/01/24 1201   melatonin tablet 10 mg  10 mg Oral QHS Dorthea Gauze, NP   10 mg at 01/04/24 2048   OLANZapine  (ZYPREXA ) injection 10 mg  10 mg Intramuscular TID PRN Levester Reagin, NP       OLANZapine  (ZYPREXA ) injection 5 mg  5  mg Intramuscular TID PRN Lewis, Tanika N, NP       OLANZapine  zydis (ZYPREXA ) disintegrating tablet 5 mg  5 mg Oral TID PRN Levester Reagin, NP       Lab Results:  Results for orders placed or performed during the hospital encounter of 12/31/23 (from the past 48 hours)  Glucose, capillary     Status: Abnormal   Collection Time: 01/03/24  8:02 PM  Result Value Ref Range   Glucose-Capillary 151 (H) 70 - 99 mg/dL    Comment: Glucose reference range applies only to samples taken after fasting for at least 8 hours.  Glucose, capillary     Status: None   Collection Time: 01/04/24  6:19 AM  Result Value Ref Range   Glucose-Capillary 95 70 - 99 mg/dL    Comment: Glucose reference range applies only to samples taken after fasting for at least 8 hours.  Glucose, capillary     Status: None   Collection Time: 01/04/24 12:01 PM  Result Value Ref Range   Glucose-Capillary 96 70 - 99 mg/dL    Comment: Glucose reference range applies only to samples taken after fasting for at least 8 hours.  Glucose, capillary     Status: Abnormal   Collection Time: 01/04/24  5:11 PM  Result Value Ref Range   Glucose-Capillary 113 (H) 70 - 99 mg/dL    Comment: Glucose reference range applies only to samples taken after fasting for at least 8 hours.  Glucose, capillary      Status: Abnormal   Collection Time: 01/04/24  8:19 PM  Result Value Ref Range   Glucose-Capillary 145 (H) 70 - 99 mg/dL    Comment: Glucose reference range applies only to samples taken after fasting for at least 8 hours.  Glucose, capillary     Status: Abnormal   Collection Time: 01/05/24  6:21 AM  Result Value Ref Range   Glucose-Capillary 106 (H) 70 - 99 mg/dL    Comment: Glucose reference range applies only to samples taken after fasting for at least 8 hours.  Glucose, capillary     Status: Abnormal   Collection Time: 01/05/24 11:50 AM  Result Value Ref Range   Glucose-Capillary 112 (H) 70 - 99 mg/dL    Comment: Glucose reference range applies only to samples taken after fasting for at least 8 hours.   Blood Alcohol level:  Lab Results  Component Value Date   [facility] <15 12/30/2023   ETH <5 08/07/2015   Metabolic Disorder Labs: Lab Results  Component Value Date   HGBA1C 4.8 12/30/2023   MPG 91.06 12/30/2023   MPG 301 08/26/2014   No results found for: PROLACTIN No results found for: CHOL, TRIG, HDL, CHOLHDL, VLDL, LDLCALC  Physical Findings: AIMS:  , ,  ,  ,    CIWA:    COWS:     Musculoskeletal: Strength & Muscle Tone: within normal limits Gait & Station: normal Patient leans: N/A  Psychiatric Specialty Exam:  Presentation  General Appearance:  Appropriate for Environment; Casual; Disheveled  Eye Contact: Good  Speech: Clear and Coherent  Speech Volume: Normal  Handedness: Right  Mood and Affect  Mood: Anxious; Depressed (Improving)  Affect: Congruent  Thought Process  Thought Processes: Coherent  Descriptions of Associations:Intact  Orientation:Full (Time, Place and Person)  Thought Content:Paranoid Ideation  History of Schizophrenia/Schizoaffective disorder:Yes  Duration of Psychotic Symptoms:Greater than six months  Hallucinations:Hallucinations: None  Ideas of Reference:None  Suicidal Thoughts:Suicidal  Thoughts: No  Homicidal Thoughts:Homicidal  Thoughts: No  Sensorium  Memory: Immediate Good; Recent Good  Judgment: Fair  Insight: Fair  Chartered certified accountant: Fair  Attention Span: Fair  Recall: Fair  Fund of Knowledge: Fair  Language: Good  Psychomotor Activity  Psychomotor Activity: Psychomotor Activity: Normal  Assets  Assets: Communication Skills; Desire for Improvement; Physical Health; Resilience  Sleep  Sleep: Sleep: Good Number of Hours of Sleep: 6  Physical Exam: Physical Exam Vitals and nursing note reviewed.  Constitutional:      Appearance: She is obese.  HENT:     Head: Normocephalic.     Right Ear: External ear normal.     Left Ear: External ear normal.     Nose: Nose normal.     Mouth/Throat:     Mouth: Mucous membranes are moist.     Pharynx: Oropharynx is clear.  Eyes:     Extraocular Movements: Extraocular movements intact.  Cardiovascular:     Rate and Rhythm: Normal rate.     Pulses: Normal pulses.  Pulmonary:     Effort: Pulmonary effort is normal. No respiratory distress.  Abdominal:     Comments: Deferred  Genitourinary:    Comments: Deferred Musculoskeletal:        General: Normal range of motion.     Cervical back: Normal range of motion.  Skin:    General: Skin is warm.  Neurological:     General: No focal deficit present.     Mental Status: She is alert and oriented to person, place, and time.  Psychiatric:        Mood and Affect: Mood normal.        Behavior: Behavior normal.    Review of Systems  Constitutional:  Negative for chills and fever.  HENT:  Negative for sore throat.   Eyes:  Negative for blurred vision.  Respiratory:  Negative for cough, sputum production, shortness of breath and wheezing.   Cardiovascular:  Negative for chest pain and palpitations.  Gastrointestinal:  Negative for heartburn and nausea.  Genitourinary:  Negative for dysuria and urgency.  Musculoskeletal:   Negative for falls.  Skin:  Negative for rash.  Neurological:  Negative for dizziness and headaches.  Endo/Heme/Allergies:        See allergy listing  Psychiatric/Behavioral:  Positive for depression. Negative for hallucinations, substance abuse and suicidal ideas. The patient is nervous/anxious. The patient does not have insomnia.    Blood pressure (!) 129/55, pulse 66, temperature 98.4 F (36.9 C), temperature source Oral, resp. rate 18, height 5' 4 (1.626 m), weight 111.6 kg, last menstrual period 01/24/2013, SpO2 97%. Body mass index is 42.23 kg/m.  Treatment Plan Summary: Daily contact with patient to assess and evaluate symptoms and progress in treatment and Medication management Principal/active diagnoses.  Schizoaffective disorder, bipolar type.  Plan: The risks/benefits/side-effects/alternatives to the medications in use were discussed in detail with the patient and time was given for patient's questions. The patient consents to medication trial.    -Discontinue Zyprexa  5 mg po Q hs for psychosis 01/02/24. - Continue Latuda 40 mg po at bedtime for psychosis -Continue Melatonin 10 mg po Q hs.   Other PRNS -Continue Maalox 30 ml Q 4 hrs PRN for indigestion -Continue MOM 30 ml po Q 6 hrs for constipation   Safety and Monitoring: Voluntary admission to inpatient psychiatric unit for safety, stabilization and treatment Daily contact with patient to assess and evaluate symptoms and progress in treatment Patient's case to be discussed in multi-disciplinary team meeting  Observation Level : q15 minute checks Vital signs: q12 hours Precautions: Safety   Discharge Planning: Social work and case management to assist with discharge planning and identification of hospital follow-up needs prior to discharge Estimated LOS: 5-7 days Discharge Concerns: Need to establish a safety plan; Medication compliance and effectiveness Discharge Goals: Return home with outpatient referrals for  mental health follow-up including medication management/psychotherapy    Physician Treatment Plan for Primary Diagnosis: Schizoaffective disorder, bipolar type (HCC)   Long Term Goal(s): Improvement in symptoms so as ready for discharge   Short Term Goals: Ability to identify changes in lifestyle to reduce recurrence of condition will improve, Ability to verbalize feelings will improve, Ability to disclose and discuss suicidal ideas, and Ability to demonstrate self-control will improve   Physician Treatment Plan for Secondary Diagnosis: Principal Problem:   Schizoaffective disorder, bipolar type (HCC)   Long Term Goal(s): Improvement in symptoms so as ready for discharge   Short Term Goals: Ability to identify and develop effective coping behaviors will improve, Ability to maintain clinical measurements within normal limits will improve, and Compliance with prescribed medications will improve   I certify that inpatient services furnished can reasonably be expected to improve the patient's condition.  There open and unit  Laurence Pons, FNP 01/05/2024, 5:09 PM Patient ID: [name], female   DOB: 01-27-1966, 58 y.o.   MRN: [id number] Patient ID: FALLON HAECKER, female   DOB: [birth year], 58 y.o.   MRN: [id number] Patient ID: LIZETTE PAZOS, female   DOB: [birth year], 58 y.o.   MRN: [id number]

## 2024-01-05 NOTE — Progress Notes (Signed)
 Pt at nurse's station asking about her medication. States that she was told that the Latuda would be 10 mg. Pt informed that her dosage is 40mg  and has been since the order was placed. "I don't really need the Latuda. I don't want to take any psychiatric medications if I don't need to. I told them that, but I'm not saying I won't take it here." Pt has been compliant with her medications. States that she has not been on medication for a number of years and has managed well. "It is an allergic reaction to aerosols and cleaning chemicals that did this not a mental problem." Pt encouraged to speak to provider as well as her PCP about med management when she is discharged.

## 2024-01-05 NOTE — Progress Notes (Signed)
   01/05/24 1000  Psych Admission Type (Psych Patients Only)  Admission Status Voluntary  Psychosocial Assessment  Patient Complaints Worrying  Eye Contact Fair  Facial Expression Worried  Affect Appropriate to circumstance  Speech Logical/coherent  Interaction Assertive  Motor Activity Slow  Appearance/Hygiene Unremarkable  Behavior Characteristics Cooperative  Mood Pleasant  Thought Process  Coherency WDL  Content WDL  Delusions Paranoid  Perception WDL  Hallucination None reported or observed  Judgment Impaired  Confusion None  Danger to Self  Current suicidal ideation? Denies  Danger to Others  Danger to Others None reported or observed

## 2024-01-05 NOTE — Group Note (Signed)
 Recreation Therapy Group Note   Group Topic:Animal Assisted Therapy   Group Date: 01/05/2024 Start Time: 0946 End Time: 1030 Facilitators: Manuelita Moxon-McCall, LRT,CTRS Location: [address] Dayroom   Animal-Assisted Activity (AAA) Program Checklist/Progress Notes Patient Eligibility Criteria Checklist & Daily Group note for Rec Tx Intervention  AAA/T Program Assumption of Risk Form signed by Patient/ or Parent Legal Guardian Yes  Patient is free of allergies or severe asthma Yes  Patient reports no fear of animals Yes  Patient reports no history of cruelty to animals Yes  Patient understands his/her participation is voluntary Yes  Patient washes hands before animal contact Yes  Patient washes hands after animal contact Yes  Behavioral Response: Engaged   Education: Charity fundraiser, Appropriate Animal Interaction   Education Outcome: Acknowledges education.    Affect/Mood: Appropriate   Participation Level: Engaged   Participation Quality: Independent   Behavior: Appropriate   Speech/Thought Process: Focused   Insight: Good   Judgement: Good   Modes of Intervention: Teaching laboratory technician   Patient Response to Interventions:  Engaged   Education Outcome:  In group clarification offered    Clinical Observations/Individualized Feedback: Patient attended session and interacted appropriately with therapy dog and peers. Patient asked appropriate questions about therapy dog and his training. Patient shared stories about their pets at home with group.    Plan: Continue to engage patient in RT group sessions 2-3x/week.   [name], LRT,CTRS  01/05/2024 12:03 PM

## 2024-01-05 NOTE — B[facility] Group Notes (Signed)
 Adult Psychoeducational Group Note  Date:  01/05/2024 Time:  11:14 AM  Group Topic/Focus:  Goals Group:   The focus of this group is to help patients establish daily goals to achieve during treatment and discuss how the patient can incorporate goal setting into their daily lives to aide in recovery.  Participation Level:  Active  Participation Quality:  Appropriate  Affect:  Appropriate  Cognitive:  Appropriate  Insight: Appropriate  Engagement in Group:  Engaged  Modes of Intervention:  Discussion  Additional Comments:  The patient engagaed in group.  [name] 01/05/2024, 11:14 AM

## 2024-01-05 NOTE — Progress Notes (Signed)
   01/05/24 0600  15 Minute Checks  Location Bedroom  Visual Appearance Calm  Behavior Composed  Sleep (Behavioral Health Patients Only)  Calculate sleep? (Click Yes once per 24 hr at 0600 safety check) Yes  Documented sleep last 24 hours 6

## 2024-01-05 NOTE — Plan of Care (Signed)
  Problem: Education: Goal: Knowledge of [facility] Education information/materials will improve Outcome: Progressing Goal: Emotional status will improve Outcome: Progressing Goal: Mental status will improve Outcome: Progressing  Pt is pleasant interacting well with Peers and Staff compliant with medications no adverse effects noted. Denies SI/HI/A/VH and verbally contracts for safety. Q 15 minutes safety checks ongoing.

## 2024-01-05 NOTE — Plan of Care (Signed)
  Problem: Education: Goal: Emotional status will improve Outcome: Progressing Goal: Mental status will improve Outcome: Progressing Goal: Verbalization of understanding the information provided will improve Outcome: Progressing   Problem: Activity: Goal: Sleeping patterns will improve Outcome: Progressing   Problem: Coping: Goal: Ability to verbalize frustrations and anger appropriately will improve Outcome: Progressing   Problem: Health Behavior/Discharge Planning: Goal: Compliance with treatment plan for underlying cause of condition will improve Outcome: Progressing   Problem: Physical Regulation: Goal: Ability to maintain clinical measurements within normal limits will improve Outcome: Progressing

## 2024-01-05 NOTE — Plan of Care (Signed)
   Problem: Education: Goal: Emotional status will improve Outcome: Progressing Goal: Mental status will improve Outcome: Progressing

## 2024-01-06 ENCOUNTER — Encounter (HOSPITAL_COMMUNITY): Payer: Self-pay | Admitting: Psychiatry

## 2024-01-06 ENCOUNTER — Encounter (HOSPITAL_COMMUNITY): Payer: Self-pay

## 2024-01-06 DIAGNOSIS — E538 Deficiency of other specified B group vitamins: Secondary | ICD-10-CM

## 2024-01-06 DIAGNOSIS — E559 Vitamin D deficiency, unspecified: Secondary | ICD-10-CM

## 2024-01-06 HISTORY — DX: Deficiency of other specified B group vitamins: E53.8

## 2024-01-06 HISTORY — DX: Vitamin D deficiency, unspecified: E55.9

## 2024-01-06 LAB — RPR: RPR Ser Ql: NONREACTIVE

## 2024-01-06 LAB — GLUCOSE, CAPILLARY
Glucose-Capillary: 115 mg/dL — ABNORMAL HIGH (ref 70–99)
Glucose-Capillary: 92 mg/dL (ref 70–99)
Glucose-Capillary: 95 mg/dL (ref 70–99)
Glucose-Capillary: 121 mg/dL — ABNORMAL HIGH (ref 70–99)

## 2024-01-06 MED ORDER — VITAMIN D (ERGOCALCIFEROL) 1.25 MG (50000 UNIT) PO CAPS
50000.0000 [IU] | ORAL_CAPSULE | Freq: Every day | ORAL | Status: DC
  Administered 2024-01-06 – 2024-01-07 (×2): 50000 [IU] via ORAL
  Filled 2024-01-06 (×2): qty 1

## 2024-01-06 MED ORDER — FOLIC ACID 1 MG PO TABS
1.0000 mg | ORAL_TABLET | Freq: Every day | ORAL | Status: DC
  Administered 2024-01-06 – 2024-01-07 (×2): 1 mg via ORAL
  Filled 2024-01-06 (×2): qty 1

## 2024-01-06 MED ORDER — VITAMIN B-12 1000 MCG PO TABS
1000.0000 ug | ORAL_TABLET | Freq: Every day | ORAL | Status: DC
  Administered 2024-01-06 – 2024-01-07 (×2): 1000 ug via ORAL
  Filled 2024-01-06 (×2): qty 1

## 2024-01-06 NOTE — Progress Notes (Signed)
 Patient remains invested with her delusions of being exposed to radiation.  Preoccupied with scorpion poison in her system which selectively predisposes her to the radiation.  Patient has no insight as she does not believe that this is in her mind.  I have encouraged her to think through initiating lurasidone as it would help her feel better.  We will continue to motivate her towards treatment.  [facility] MD Progress Note  01/06/2024 4:25 PM [name]  MRN:  [id number]  Principal Problem: Schizoaffective disorder, bipolar type (HCC) Diagnosis: Principal Problem:   Schizoaffective disorder, bipolar type [facility])  Reason for admission:   This is an admission evaluation for this 58 year old Caucasian female with history of schizoaffective disorder and probably substance use disorder. Patient has been admitted/treated for mental health related issues remotely and this [facility]. Chart review has indicated that the last time patient was in this hospital was 2015, almost 10 years ago.  At the time, she was treated for schizoaffective disorder.  At the time patient was discharged on BuSpar  10 mg, Tegretol  XR 200 mg, olanzapine  5 mg and trazodone  50 mg.  Patient has no other record of psychiatric hospitalizations since 2015.  However, she is being admitted to this [facility] this time from the [facility] in [place] .  Chart review indicated that patient was a police drop-off with complaint of psychosis as she was feeling like the radioactive trash in her neighborhood may be poisoning her. A review of her current lab results has shown basically normal levels. However, will obtain, lipid panel.  Her toxicology & UDS results were clear.   24-hour chart reviewed: Vital signs reviewed without critical values.  Compliant with psychotropic medications.  No as needed required. No agitation protocol required.  Today's assessment notes On assessment today, the pt reports that their mood is euthymic,  improved since admission, and stable. Denies feeling down, depressed, or sad.  Patient report delusions of being exposed to radiation has been resolved.  She is no longer preoccupied with scorpion poison in her system which selectively predisposes her to the radiation.  Her insight and judgment is fair.  Encouragement provided to continue taking her lurasidone as it would help her feel better.  She denies suicidal ideation, homicidal ideation or AVH.  No changes made to her treatment plan today, patient responding well to Latuda and we will continue to monitor effectiveness.  Patient was moved to go from [address] to her room 403 due to stability of her symptoms.  Estimated date of discharge 01/07/2024.  Reports that anxiety symptoms are at manageable level. Sleep is stable. Concentration is without complaint.  Energy level is adequate. Denies having any suicidal thoughts. Denies having any suicidal intent and plan.  Denies having any HI.  Denies having psychotic symptoms.   Denies having side effects to current psychiatric medications.   Discussed discharge planning:  How to identify the signs of impending crisis, use of internal coping strategies, reaching out to friends and family that can help navigate a crisis, and a list of mental health professionals and agencies to call. Further to follow up on her mental health appointments and her PCP appointments.    Total Time spent with patient: 45 minutes  Past Psychiatric History:  Schizoaffective disorder, stimulant use disorder.   Past Medical History:  Past Medical History:  Diagnosis Date   Anxiety    B12 deficiency 01/06/2024   Bipolar 1 disorder (HCC)    pt denies, says was misdiagnosed  CHF (congestive heart failure) (HCC)    Chronic back pain    Chronic chest wall pain    Chronic right shoulder pain    Diabetes mellitus    Fibromyalgia    Folate deficiency 01/06/2024   Hypertension    Panic attack    Psychotic disorder (HCC)     Schizoaffective disorder (HCC)    Vitamin D deficiency 01/06/2024    Past Surgical History:  Procedure Laterality Date   CHOLECYSTECTOMY     MANDIBLE SURGERY     Family History:  Family History  Problem Relation Age of Onset   Heart failure Mother    Heart failure Other    Family Psychiatric  History: See H&P Social History:  Social History   Substance and Sexual Activity  Alcohol Use No     Social History   Substance and Sexual Activity  Drug Use Yes   Types: Marijuana    Social History   Socioeconomic History   Marital status: Legally Separated    Spouse name: Not on file   Number of children: Not on file   Years of education: Not on file   Highest education level: Not on file  Occupational History   Not on file  Tobacco Use   Smoking status: Every Day    Current packs/day: 1.00    Average packs/day: 1 pack/day for 30.0 years (30.0 ttl pk-yrs)    Types: Cigarettes   Smokeless tobacco: Never  Substance and Sexual Activity   Alcohol use: No   Drug use: Yes    Types: Marijuana   Sexual activity: Yes    Birth control/protection: None  Other Topics Concern   Not on file  Social History Narrative   Not on file   Social Drivers of Health   Financial Resource Strain: Not on file  Food Insecurity: No Food Insecurity (12/31/2023)   Hunger Vital Sign    Worried About Running Out of Food in the Last Year: Never true    Ran Out of Food in the Last Year: Never true  Transportation Needs: No Transportation Needs (12/31/2023)   PRAPARE - Administrator, Civil Service (Medical): No    Lack of Transportation (Non-Medical): No  Physical Activity: Not on file  Stress: Not on file  Social Connections: Not on file   Additional Social History:     Sleep: Good  Appetite:  Good  Current Medications: Current Facility-Administered Medications  Medication Dose Route Frequency Provider Last Rate Last Admin   alum & mag hydroxide-simeth (MAALOX/MYLANTA)  200-200-20 MG/5ML suspension 30 mL  30 mL Oral Q4H PRN Lewis, Tanika N, NP       cyanocobalamin (VITAMIN B12) tablet 1,000 mcg  1,000 mcg Oral Daily Parker, Alvin S, MD   1,000 mcg at 01/06/24 1254   folic acid (FOLVITE) tablet 1 mg  1 mg Oral Daily Parker, Alvin S, MD   1 mg at 01/06/24 1254   insulin  aspart (novoLOG ) injection 0-5 Units  0-5 Units Subcutaneous QHS Levester Reagin, NP       insulin  aspart (novoLOG ) injection 0-9 Units  0-9 Units Subcutaneous TID WC Lewis, Tanika N, NP       lurasidone (LATUDA) tablet 40 mg  40 mg Oral QHS Carolyne Whitsel C, FNP   40 mg at 01/05/24 2057   magnesium  hydroxide (MILK OF MAGNESIA) suspension 30 mL  30 mL Oral Daily PRN Levester Reagin, NP   30 mL at 01/01/24 1201   melatonin  tablet 10 mg  10 mg Oral QHS Dorthea Gauze, NP   10 mg at 01/05/24 2057   OLANZapine  (ZYPREXA ) injection 10 mg  10 mg Intramuscular TID PRN Levester Reagin, NP       OLANZapine  (ZYPREXA ) injection 5 mg  5 mg Intramuscular TID PRN Lewis, Tanika N, NP       OLANZapine  zydis (ZYPREXA ) disintegrating tablet 5 mg  5 mg Oral TID PRN Levester Reagin, NP       Vitamin D (Ergocalciferol) (DRISDOL) 1.25 MG (50000 UNIT) capsule 50,000 Units  50,000 Units Oral Daily Parker, Alvin S, MD   50,000 Units at 01/06/24 1254   Lab Results:  Results for orders placed or performed during the hospital encounter of 12/31/23 (from the past 48 hours)  Glucose, capillary     Status: Abnormal   Collection Time: 01/04/24  5:11 PM  Result Value Ref Range   Glucose-Capillary 113 (H) 70 - 99 mg/dL    Comment: Glucose reference range applies only to samples taken after fasting for at least 8 hours.  Glucose, capillary     Status: Abnormal   Collection Time: 01/04/24  8:19 PM  Result Value Ref Range   Glucose-Capillary 145 (H) 70 - 99 mg/dL    Comment: Glucose reference range applies only to samples taken after fasting for at least 8 hours.  Glucose, capillary     Status: Abnormal   Collection Time: 01/05/24   6:21 AM  Result Value Ref Range   Glucose-Capillary 106 (H) 70 - 99 mg/dL    Comment: Glucose reference range applies only to samples taken after fasting for at least 8 hours.  Glucose, capillary     Status: Abnormal   Collection Time: 01/05/24 11:50 AM  Result Value Ref Range   Glucose-Capillary 112 (H) 70 - 99 mg/dL    Comment: Glucose reference range applies only to samples taken after fasting for at least 8 hours.  Glucose, capillary     Status: Abnormal   Collection Time: 01/05/24  5:25 PM  Result Value Ref Range   Glucose-Capillary 108 (H) 70 - 99 mg/dL    Comment: Glucose reference range applies only to samples taken after fasting for at least 8 hours.  Folate     Status: Abnormal   Collection Time: 01/05/24  6:58 PM  Result Value Ref Range   Folate 5.6 (L) >5.9 ng/mL    Comment: Performed at [facility], [address], [place], [place] [address]  RPR     Status: None   Collection Time: 01/05/24  6:58 PM  Result Value Ref Range   RPR Ser Ql NON REACTIVE NON REACTIVE    Comment: Performed at [facility], [address], [place], [place] [address]  Vitamin B12     Status: Abnormal   Collection Time: 01/05/24  6:58 PM  Result Value Ref Range   Vitamin B-12 177 (L) 180 - 914 pg/mL    Comment: (NOTE) This assay is not validated for testing neonatal or myeloproliferative syndrome specimens for Vitamin B12 levels. Performed at [facility], [address], [place], [place] [address]   VITAMIN D 25 Hydroxy (Vit-D Deficiency, Fractures)     Status: Abnormal   Collection Time: 01/05/24  6:58 PM  Result Value Ref Range   Vit D, 25-Hydroxy 19.77 (L) 30 - 100 ng/mL    Comment: (NOTE) Vitamin D deficiency has been defined by the Institute of Medicine  and an  Endocrine Society practice guideline as a level of serum 25-OH  vitamin D less than 20 ng/mL (1,2). The Endocrine Society went on to  further define vitamin D insufficiency as a  level between 21 and 29  ng/mL (2).  1. IOM (Institute of Medicine). 2010. Dietary reference intakes for  calcium and D. [place]: [employer]. 2. Holick MF, Binkley [place], Bischoff-Ferrari HA, et al. Evaluation,  treatment, and prevention of vitamin D deficiency: an Endocrine  Society clinical practice guideline, JCEM. 2011 Jul; 96(7): 1911-30.  Performed at [facility], [address], [place], [place] [address]   TSH     Status: None   Collection Time: 01/05/24  6:58 PM  Result Value Ref Range   TSH 3.125 0.350 - 4.500 uIU/mL    Comment: Performed by a 3rd Generation assay with a functional sensitivity of <=0.01 uIU/mL. Performed at [facility], [address] [place], [place] [address]   Lipid panel     Status: None   Collection Time: 01/05/24  6:58 PM  Result Value Ref Range   Cholesterol 139 0 - 200 mg/dL   Triglycerides 829 <562 mg/dL   HDL 42 >13 mg/dL   Total CHOL/HDL Ratio 3.3 RATIO   VLDL 21 0 - 40 mg/dL   LDL Cholesterol 76 0 - 99 mg/dL    Comment:        Total Cholesterol/HDL:CHD Risk Coronary Heart Disease Risk Table                     Men   Women  1/2 Average Risk   3.4   3.3  Average Risk       5.0   4.4  2 X Average Risk   9.6   7.1  3 X Average Risk  23.4   11.0        Use the calculated Patient Ratio above and the CHD Risk Table to determine the patient's CHD Risk.        ATP III CLASSIFICATION (LDL):  <100     mg/dL   Optimal  086-578  mg/dL   Near or Above                    Optimal  130-159  mg/dL   Borderline  469-629  mg/dL   High  >528     mg/dL   Very High Performed at [facility], [address], [place], [place] [address]   HIV Antibody (routine testing w rflx)     Status: None   Collection Time: 01/05/24  6:58 PM  Result Value Ref Range   HIV Screen 4th Generation wRfx Non Reactive Non Reactive    Comment: Performed at [facility], [address],  [place], [place] [address]  Glucose, capillary     Status: Abnormal   Collection Time: 01/05/24  7:49 PM  Result Value Ref Range   Glucose-Capillary 168 (H) 70 - 99 mg/dL    Comment: Glucose reference range applies only to samples taken after fasting for at least 8 hours.  Glucose, capillary     Status: Abnormal   Collection Time: 01/06/24  5:30 AM  Result Value Ref Range   Glucose-Capillary 115 (H) 70 - 99 mg/dL    Comment: Glucose reference range applies only to samples taken after fasting for at least 8 hours.  Glucose, capillary     Status: None   Collection  Time: 01/06/24 12:04 PM  Result Value Ref Range   Glucose-Capillary 92 70 - 99 mg/dL    Comment: Glucose reference range applies only to samples taken after fasting for at least 8 hours.   Blood Alcohol level:  Lab Results  Component Value Date   [facility] <15 12/30/2023   ETH <5 08/07/2015   Metabolic Disorder Labs: Lab Results  Component Value Date   HGBA1C 4.8 12/30/2023   MPG 91.06 12/30/2023   MPG 301 08/26/2014   No results found for: PROLACTIN Lab Results  Component Value Date   CHOL 139 01/05/2024   TRIG 103 01/05/2024   HDL 42 01/05/2024   CHOLHDL 3.3 01/05/2024   VLDL 21 01/05/2024   LDLCALC 76 01/05/2024    Physical Findings: AIMS:  , ,  ,  ,    CIWA:    COWS:     Musculoskeletal: Strength & Muscle Tone: within normal limits Gait & Station: normal Patient leans: N/A  Psychiatric Specialty Exam:  Presentation  General Appearance:  Appropriate for Environment; Casual; Fairly Groomed  Eye Contact: Good  Speech: Clear and Coherent  Speech Volume: Normal  Handedness: Right  Mood and Affect  Mood: Euthymic  Affect: Congruent  Thought Process  Thought Processes: Coherent  Descriptions of Associations:Intact  Orientation:Full (Time, Place and Person)  Thought Content:Logical  History of Schizophrenia/Schizoaffective disorder:Yes  Duration of Psychotic Symptoms:Greater than six  months  Hallucinations:Hallucinations: None  Ideas of Reference:None  Suicidal Thoughts:Suicidal Thoughts: No  Homicidal Thoughts:Homicidal Thoughts: No  Sensorium  Memory: Immediate Good; Recent Good  Judgment: Fair  Insight: Fair  Art therapist  Concentration: Good  Attention Span: Good  Recall: Fair  Fund of Knowledge: Fair  Language: Good  Psychomotor Activity  Psychomotor Activity: Psychomotor Activity: Normal  Assets  Assets: Communication Skills; Desire for Improvement; Physical Health; Resilience  Sleep  Sleep: Sleep: Good Number of Hours of Sleep: 6.75  Physical Exam: Physical Exam Vitals and nursing note reviewed.  Constitutional:      Appearance: She is obese.  HENT:     Head: Normocephalic.     Right Ear: External ear normal.     Left Ear: External ear normal.     Nose: Nose normal.     Mouth/Throat:     Mouth: Mucous membranes are moist.     Pharynx: Oropharynx is clear.  Eyes:     Extraocular Movements: Extraocular movements intact.  Cardiovascular:     Rate and Rhythm: Normal rate.     Pulses: Normal pulses.  Pulmonary:     Effort: Pulmonary effort is normal. No respiratory distress.  Abdominal:     Comments: Deferred  Genitourinary:    Comments: Deferred Musculoskeletal:        General: Normal range of motion.     Cervical back: Normal range of motion.  Skin:    General: Skin is warm.  Neurological:     General: No focal deficit present.     Mental Status: She is alert and oriented to person, place, and time.  Psychiatric:        Mood and Affect: Mood normal.        Behavior: Behavior normal.        Thought Content: Thought content normal.    Review of Systems  Constitutional:  Negative for chills and fever.  HENT:  Negative for sore throat.   Eyes:  Negative for blurred vision.  Respiratory:  Negative for cough, sputum production, shortness of breath and wheezing.  Cardiovascular:  Negative for chest  pain and palpitations.  Gastrointestinal:  Negative for heartburn and nausea.  Genitourinary:  Negative for dysuria and urgency.  Musculoskeletal:  Negative for falls.  Skin:  Negative for rash.  Neurological:  Negative for dizziness and headaches.  Endo/Heme/Allergies:        See allergy listing  Psychiatric/Behavioral:  Positive for depression. Negative for hallucinations, substance abuse and suicidal ideas. The patient is nervous/anxious. The patient does not have insomnia.    Blood pressure (!) 150/70, pulse 76, temperature 98.7 F (37.1 C), temperature source Oral, resp. rate 18, height 5' 4 (1.626 m), weight 111.6 kg, last menstrual period 01/24/2013, SpO2 96%. Body mass index is 42.23 kg/m.  Treatment Plan Summary: Daily contact with patient to assess and evaluate symptoms and progress in treatment and Medication management Principal/active diagnoses.  Schizoaffective disorder, bipolar type.  Plan: The risks/benefits/side-effects/alternatives to the medications in use were discussed in detail with the patient and time was given for patient's questions. The patient consents to medication trial.    -Discontinue Zyprexa  5 mg po Q hs for psychosis 01/02/24. - Continue Latuda 40 mg po at bedtime for psychosis -Continue Melatonin 10 mg po Q hs.   Other PRNS -Continue Maalox 30 ml Q 4 hrs PRN for indigestion -Continue MOM 30 ml po Q 6 hrs for constipation   Safety and Monitoring: Voluntary admission to inpatient psychiatric unit for safety, stabilization and treatment Daily contact with patient to assess and evaluate symptoms and progress in treatment Patient's case to be discussed in multi-disciplinary team meeting Observation Level : q15 minute checks Vital signs: q12 hours Precautions: Safety   Discharge Planning: Social work and case management to assist with discharge planning and identification of hospital follow-up needs prior to discharge Estimated LOS: 5-7  days Discharge Concerns: Need to establish a safety plan; Medication compliance and effectiveness Discharge Goals: Return home with outpatient referrals for mental health follow-up including medication management/psychotherapy    Physician Treatment Plan for Primary Diagnosis: Schizoaffective disorder, bipolar type (HCC)   Long Term Goal(s): Improvement in symptoms so as ready for discharge   Short Term Goals: Ability to identify changes in lifestyle to reduce recurrence of condition will improve, Ability to verbalize feelings will improve, Ability to disclose and discuss suicidal ideas, and Ability to demonstrate self-control will improve   Physician Treatment Plan for Secondary Diagnosis: Principal Problem:   Schizoaffective disorder, bipolar type (HCC)   Long Term Goal(s): Improvement in symptoms so as ready for discharge   Short Term Goals: Ability to identify and develop effective coping behaviors will improve, Ability to maintain clinical measurements within normal limits will improve, and Compliance with prescribed medications will improve   I certify that inpatient services furnished can reasonably be expected to improve the patient's condition.  There open and unit  Laurence Pons, FNP 01/06/2024, 4:25 PM Patient ID: [name], female   DOB: Dec 25, 1965, 58 y.o.   MRN: [id number] Patient ID: [name], female   DOB: 1966-04-19, 58 y.o.   MRN: [id number] Patient ID: [name], female   DOB: 1965-11-25, 58 y.o.   MRN: [id number] Patient ID: [name], female   DOB: Sep 04, 1965, 58 y.o.   MRN: [id number]

## 2024-01-06 NOTE — Plan of Care (Signed)
  Problem: Education: Goal: Knowledge of [facility] Education information/materials will improve Outcome: Progressing Goal: Mental status will improve Outcome: Progressing   Problem: Coping: Goal: Ability to demonstrate self-control will improve Outcome: Progressing   Problem: Health Behavior/Discharge Planning: Goal: Compliance with treatment plan for underlying cause of condition will improve Outcome: Progressing

## 2024-01-06 NOTE — BH IP Treatment Plan (Signed)
 Interdisciplinary Treatment and Diagnostic Plan Update  01/06/2024 Time of Session: 12:15 PM - UPDATE [name] MRN: [id number]  Principal Diagnosis: Schizoaffective disorder, bipolar type (HCC)  Secondary Diagnoses: Principal Problem:   Schizoaffective disorder, bipolar type (HCC)   Current Medications:  Current Facility-Administered Medications  Medication Dose Route Frequency Provider Last Rate Last Admin   alum & mag hydroxide-simeth (MAALOX/MYLANTA) 200-200-20 MG/5ML suspension 30 mL  30 mL Oral Q4H PRN Lewis, Tanika N, NP       cyanocobalamin (VITAMIN B12) tablet 1,000 mcg  1,000 mcg Oral Daily Parker, Alvin S, MD   1,000 mcg at 01/06/24 1254   folic acid (FOLVITE) tablet 1 mg  1 mg Oral Daily Parker, Alvin S, MD   1 mg at 01/06/24 1254   insulin  aspart (novoLOG ) injection 0-5 Units  0-5 Units Subcutaneous QHS Levester Reagin, NP       insulin  aspart (novoLOG ) injection 0-9 Units  0-9 Units Subcutaneous TID WC Lewis, Tanika N, NP       lurasidone (LATUDA) tablet 40 mg  40 mg Oral QHS Ntuen, Tina C, FNP   40 mg at 01/05/24 2057   magnesium  hydroxide (MILK OF MAGNESIA) suspension 30 mL  30 mL Oral Daily PRN Levester Reagin, NP   30 mL at 01/01/24 1201   melatonin tablet 10 mg  10 mg Oral QHS Dorthea Gauze, NP   10 mg at 01/05/24 2057   OLANZapine  (ZYPREXA ) injection 10 mg  10 mg Intramuscular TID PRN Levester Reagin, NP       OLANZapine  (ZYPREXA ) injection 5 mg  5 mg Intramuscular TID PRN Levester Reagin, NP       OLANZapine  zydis (ZYPREXA ) disintegrating tablet 5 mg  5 mg Oral TID PRN Levester Reagin, NP       Vitamin D (Ergocalciferol) (DRISDOL) 1.25 MG (50000 UNIT) capsule 50,000 Units  50,000 Units Oral Daily Parker, Alvin S, MD   50,000 Units at 01/06/24 1254   PTA Medications: No medications prior to admission.    Patient Stressors: Health problems   Medication change or noncompliance    Patient Strengths: Capable of independent living  Communication skills   Supportive family/friends   Treatment Modalities: Medication Management, Group therapy, Case management,  1 to 1 session with clinician, Psychoeducation, Recreational therapy.   Physician Treatment Plan for Primary Diagnosis: Schizoaffective disorder, bipolar type (HCC) Long Term Goal(s): Improvement in symptoms so as ready for discharge   Short Term Goals: Ability to identify and develop effective coping behaviors will improve Ability to maintain clinical measurements within normal limits will improve Compliance with prescribed medications will improve Ability to identify changes in lifestyle to reduce recurrence of condition will improve Ability to verbalize feelings will improve Ability to disclose and discuss suicidal ideas Ability to demonstrate self-control will improve  Medication Management: Evaluate patient's response, side effects, and tolerance of medication regimen.  Therapeutic Interventions: 1 to 1 sessions, Unit Group sessions and Medication administration.  Evaluation of Outcomes: Progressing  Physician Treatment Plan for Secondary Diagnosis: Principal Problem:   Schizoaffective disorder, bipolar type (HCC)  Long Term Goal(s): Improvement in symptoms so as ready for discharge   Short Term Goals: Ability to identify and develop effective coping behaviors will improve Ability to maintain clinical measurements within normal limits will improve Compliance with prescribed medications will improve Ability to identify changes in lifestyle to reduce recurrence of condition will improve Ability to verbalize feelings will improve Ability to disclose and discuss suicidal  ideas Ability to demonstrate self-control will improve     Medication Management: Evaluate patient's response, side effects, and tolerance of medication regimen.  Therapeutic Interventions: 1 to 1 sessions, Unit Group sessions and Medication administration.  Evaluation of Outcomes: Progressing   RN  Treatment Plan for Primary Diagnosis: Schizoaffective disorder, bipolar type (HCC) Long Term Goal(s): Knowledge of disease and therapeutic regimen to maintain health will improve  Short Term Goals: Ability to remain free from injury will improve, Ability to verbalize frustration and anger appropriately will improve, Ability to verbalize feelings will improve, and Ability to disclose and discuss suicidal ideas  Medication Management: RN will administer medications as ordered by provider, will assess and evaluate patient's response and provide education to patient for prescribed medication. RN will report any adverse and/or side effects to prescribing provider.  Therapeutic Interventions: 1 on 1 counseling sessions, Psychoeducation, Medication administration, Evaluate responses to treatment, Monitor vital signs and CBGs as ordered, Perform/monitor CIWA, COWS, AIMS and Fall Risk screenings as ordered, Perform wound care treatments as ordered.  Evaluation of Outcomes: Progressing   LCSW Treatment Plan for Primary Diagnosis: Schizoaffective disorder, bipolar type (HCC) Long Term Goal(s): Safe transition to appropriate next level of care at discharge, Engage patient in therapeutic group addressing interpersonal concerns.  Short Term Goals: Engage patient in aftercare planning with referrals and resources, Increase ability to appropriately verbalize feelings, Facilitate acceptance of mental health diagnosis and concerns, and Identify triggers associated with mental health/substance abuse issues  Therapeutic Interventions: Assess for all discharge needs, 1 to 1 time with Social worker, Explore available resources and support systems, Assess for adequacy in community support network, Educate family and significant other(s) on suicide prevention, Complete Psychosocial Assessment, Interpersonal group therapy.  Evaluation of Outcomes: Progressing   Progress in Treatment: Attending groups: attended some  groups Participating in groups: Yes Taking medication as prescribed:  Yes Toleration medication:  Yes Family/Significant other contact made: Yes, contacted [name] (husband) [phone] Patient understands diagnosis: No. Discussing patient identified problems/goals with staff: No. Medical problems stabilized or resolved: Yes. Denies suicidal/homicidal ideation: Yes. Issues/concerns per patient self-inventory: No.   New problem(s) identified:  No   New Short Term/Long Term Goal(s):     medication stabilization, elimination of SI thoughts, development of comprehensive mental wellness plan.    Patient Goals:  I have poison in my blood.  When I'm around radioactive waste, I start running a fever, feel it in my muscles, and I get physically sick.     Discharge Plan or Barriers:  Patient recently admitted. CSW will continue to follow and assess for appropriate referrals and possible discharge planning.    Reason for Continuation of Hospitalization: Delusions  Medication stabilization   Estimated Length of Stay:  1 - 2 days  Last 3 [place] Suicide Severity Risk Score: Flowsheet Row [facility] (Current[facility] from 12/30/2023 in [facility] at [facility]  C-SSRS RISK CATEGORY No Risk No Risk       Last PHQ 2/9 Scores:     No data to display          Scribe for Treatment Team: Brenisha Tsui O Verlie Liotta, LCSWA 01/06/2024 7:24 PM

## 2024-01-06 NOTE — BHH Group Notes (Signed)
 Adult Psychoeducational Group Note  Date:  01/06/2024 Time:  9:21 AM  Group Topic/Focus:  Goals Group:   The focus of this group is to help patients establish daily goals to achieve during treatment and discuss how the patient can incorporate goal setting into their daily lives to aide in recovery. Orientation:   The focus of this group is to educate the patient on the purpose and policies of crisis stabilization and provide a format to answer questions about their admission.  The group details unit policies and expectations of patients while admitted.  Participation Level:  Active  Participation Quality:  Appropriate  Affect:  Appropriate  Cognitive:  Appropriate  Insight: Appropriate  Engagement in Group:  Engaged  Modes of Intervention:  Discussion  Additional Comments:  Pt attended the goals group and remained appropriate and engaged throughout the duration of the group.   [name] 01/06/2024, 9:21 AM

## 2024-01-06 NOTE — Progress Notes (Signed)
   01/06/24 1100  Psych Admission Type (Psych Patients Only)  Admission Status Voluntary  Psychosocial Assessment  Patient Complaints None  Eye Contact Fair  Facial Expression Worried  Affect Appropriate to circumstance  Speech Logical/coherent  Interaction Minimal  Motor Activity Slow  Appearance/Hygiene Unremarkable  Behavior Characteristics Cooperative  Mood Pleasant  Thought Process  Coherency WDL  Content WDL  Delusions None reported or observed  Perception WDL  Hallucination None reported or observed  Judgment Impaired  Confusion None  Danger to Self  Current suicidal ideation? Denies  Danger to Others  Danger to Others None reported or observed

## 2024-01-06 NOTE — BHH Group Notes (Signed)

## 2024-01-06 NOTE — BHH Group Notes (Signed)
 BHH Group Notes:  (Nursing/MHT/Case Management/Adjunct)  Date:  01/06/2024  Time:  8:38 PM  Type of Therapy:  NA Group  Participation Level:  Active  Participation Quality:  Appropriate  Affect:  Appropriate  Cognitive:  Appropriate  Insight:  Appropriate  Engagement in Group:  Engaged  Modes of Intervention:  Education  Summary of Progress/Problems: Attended NA meeting.  [name] 01/06/2024, 8:38 PM

## 2024-01-06 NOTE — Group Note (Signed)
 Recreation Therapy Group Note   Group Topic:Team Building  Group Date: 01/06/2024 Start Time: 1115 End Time: 1145 Facilitators: [name], LRT,CTRS Location: [address] Dayroom   Group Topic: Communication, Team Building, Problem Solving  Goal Area(s) Addresses:  Patient will effectively work with peer towards shared goal.  Patient will identify skills used to make activity successful.  Patient will identify how skills used during activity can be used to reach post d/c goals.   Behavioral Response: Engaged  Intervention: STEM Activity  Activity: Stage manager. In teams of 3-5, patients were given 12 plastic drinking straws and an equal length of masking tape. Using the materials provided, patients were asked to build a landing pad to catch a golf ball dropped from approximately 5 feet in the air. All materials were required to be used by the team in their design. LRT facilitated post-activity discussion.  Education: Pharmacist, community, Scientist, physiological, Discharge Planning   Education Outcome: Acknowledges education/In group clarification offered/Needs additional education.    Affect/Mood: Appropriate   Participation Level: Engaged   Participation Quality: Independent   Behavior: Appropriate   Speech/Thought Process: Focused   Insight: Good   Judgement: Good   Modes of Intervention: STEM Activity   Patient Response to Interventions:  Engaged   Education Outcome:  In group clarification offered    Clinical Observations/Individualized Feedback: Pt was engaged and bright during group session. Pt thought of the idea for the structure of the landing pad built by the group. Pt shared the group used construction, communication, team work to complete the activity. Pt also shared working with support system to strategies on how to construct a plan and work through it. Pt also highlighted when a plan doesn't as planned , to make adjustments and keep moving.    Plan:  Continue to engage patient in RT group sessions 2-3x/week.   [name], LRT,CTRS 01/06/2024 1:51 PM

## 2024-01-06 NOTE — Plan of Care (Signed)
  Problem: Education: Goal: Emotional status will improve Outcome: Progressing Goal: Mental status will improve Outcome: Progressing   Problem: Activity: Goal: Interest or engagement in activities will improve Outcome: Progressing   Problem: Education: Goal: Emotional status will improve Outcome: Progressing Goal: Mental status will improve Outcome: Progressing

## 2024-01-06 NOTE — Progress Notes (Signed)
   01/06/24 2015  Psych Admission Type (Psych Patients Only)  Admission Status Voluntary  Psychosocial Assessment  Patient Complaints None  Eye Contact Fair  Facial Expression Worried  Affect Appropriate to circumstance  Speech Logical/coherent  Interaction Assertive  Motor Activity Slow  Appearance/Hygiene Unremarkable  Behavior Characteristics Cooperative  Mood Pleasant  Aggressive Behavior  Effect No apparent injury  Thought Process  Coherency WDL  Content WDL  Delusions WDL  Perception WDL  Hallucination None reported or observed  Judgment Impaired  Confusion WDL  Danger to Self  Current suicidal ideation? Denies  Danger to Others  Danger to Others None reported or observed

## 2024-01-06 NOTE — Progress Notes (Signed)
   01/06/24 1455  Spiritual Encounters  Type of Visit Follow up  Care provided to: Patient  Spiritual Framework  Presenting Themes Community and relationships   I engaged [employer] while rounding on unit. We met last week in spirituality group on [address] hall.  [name] shared general likes and hopes, describing her siblings and past family relationships. She had a brighter affect and seemed to be looking forward to relationships whereas last week she presented as more inward and inclined to isolate.  I provided relational support and active listening as well as affirmation.  [name] L. Minetta Aly, M.Div [phone]

## 2024-01-07 LAB — GLUCOSE, CAPILLARY
Glucose-Capillary: 119 mg/dL — ABNORMAL HIGH (ref 70–99)
Glucose-Capillary: 74 mg/dL (ref 70–99)

## 2024-01-07 MED ORDER — MELATONIN 10 MG PO TABS: 10.0000 mg | ORAL_TABLET | Freq: Every day | ORAL | 0 refills | Status: AC

## 2024-01-07 MED ORDER — LURASIDONE HCL 40 MG PO TABS: 40.0000 mg | ORAL_TABLET | Freq: Every day | ORAL | 0 refills | Status: AC

## 2024-01-07 MED ORDER — FOLIC ACID 1 MG PO TABS: 1.0000 mg | ORAL_TABLET | Freq: Every day | ORAL | 0 refills | Status: AC

## 2024-01-07 MED ORDER — CYANOCOBALAMIN 1000 MCG PO TABS: 1000.0000 ug | ORAL_TABLET | Freq: Every day | ORAL | 0 refills | Status: AC

## 2024-01-07 MED ORDER — INSULIN ASPART 100 UNIT/ML IJ SOLN: 0.0000 [IU] | Freq: Three times a day (TID) | INTRAMUSCULAR | 11 refills | Status: AC

## 2024-01-07 MED ORDER — INSULIN ASPART 100 UNIT/ML IJ SOLN: 0.0000 [IU] | Freq: Every day | INTRAMUSCULAR | 11 refills | Status: AC

## 2024-01-07 MED ORDER — VITAMIN D (ERGOCALCIFEROL) 1.25 MG (50000 UNIT) PO CAPS: 50000.0000 [IU] | ORAL_CAPSULE | Freq: Every day | ORAL | 0 refills | Status: AC

## 2024-01-07 NOTE — Plan of Care (Signed)

## 2024-01-07 NOTE — Progress Notes (Signed)
 Patient verbalizes readiness for discharge. All patient belongings returned to patient. Discharge instructions read and discussed with patient (appointments, medications, resources). Patient expressed gratitude for care provided. Patient discharged to lobby at 1300 where her husband was waiting.

## 2024-01-07 NOTE — BHH Suicide Risk Assessment (Signed)
 Suicide Risk Assessment  Discharge Assessment    [facility] Discharge Suicide Risk Assessment   Principal Problem: Schizoaffective disorder, bipolar type [facility]) Discharge Diagnoses: Principal Problem:   Schizoaffective disorder, bipolar type (HCC)  Reason for admission:   This is an admission evaluation for this 58 year old Caucasian female with history of schizoaffective disorder and probably substance use disorder. Patient has been admitted/treated for mental health related issues remotely and this [facility]. Chart review has indicated that the last time patient was in this hospital was 2015, almost 10 years ago.  At the time, she was treated for schizoaffective disorder.  At the time patient was discharged on BuSpar  10 mg, Tegretol  XR 200 mg, olanzapine  5 mg and trazodone  50 mg.  Patient has no other record of psychiatric hospitalizations since 2015.  However, she is being admitted to this [facility] this time from the [facility] in [place] .  Chart review indicated that patient was a police drop-off with complaint of psychosis as she was feeling like the radioactive trash in her neighborhood may be poisoning her. A review of her current lab results has shown basically normal levels. However, will obtain, lipid panel.  Her toxicology & UDS results were clear.  Total Time spent with patient: 45 minutes  Musculoskeletal: Strength & Muscle Tone: within normal limits Gait & Station: normal Patient leans: N/A  Psychiatric Specialty Exam  Presentation  General Appearance:  Appropriate for Environment; Casual; Fairly Groomed  Eye Contact: Good  Speech: Clear and Coherent  Speech Volume: Normal  Handedness: Right  Mood and Affect  Mood: Euthymic  Duration of Depression Symptoms: N/A  Affect: Congruent  Thought Process  Thought Processes: Coherent  Descriptions of Associations:Intact  Orientation:Full (Time, Place and Person)  Thought Content:Logical  History of  Schizophrenia/Schizoaffective disorder:Yes  Duration of Psychotic Symptoms:Greater than six months  Hallucinations:Hallucinations: None  Ideas of Reference:None  Suicidal Thoughts:Suicidal Thoughts: No  Homicidal Thoughts:Homicidal Thoughts: No  Sensorium  Memory: Immediate Good; Recent Good  Judgment: Fair  Insight: Fair  Art therapist  Concentration: Good  Attention Span: Good  Recall: Fair  Fund of Knowledge: Fair  Language: Good  Psychomotor Activity  Psychomotor Activity: Psychomotor Activity: Normal  Assets  Assets: Communication Skills; Desire for Improvement; Physical Health; Resilience  Sleep  Sleep: Sleep: Good  Estimated Sleeping Duration (Last 24 Hours): 3.25 hours  Physical Exam: Physical Exam Vitals and nursing note reviewed.  Constitutional:      Appearance: She is obese.  HENT:     Head: Normocephalic.     Right Ear: External ear normal.     Left Ear: External ear normal.     Nose: Nose normal.     Mouth/Throat:     Mouth: Mucous membranes are moist.     Pharynx: Oropharynx is clear.   Eyes:     Extraocular Movements: Extraocular movements intact.    Cardiovascular:     Rate and Rhythm: Normal rate.     Pulses: Normal pulses.  Pulmonary:     Effort: Pulmonary effort is normal.  Abdominal:     Comments: Deferred  Genitourinary:    Comments: Deferred  Musculoskeletal:        General: Normal range of motion.     Cervical back: Normal range of motion.   Skin:    General: Skin is warm.   Neurological:     General: No focal deficit present.     Mental Status: She is alert and oriented to person, place, and time.  Psychiatric:        Mood and Affect: Mood normal.        Behavior: Behavior normal.        Thought Content: Thought content normal.    Review of Systems  Constitutional:  Negative for chills and fever.  HENT:  Negative for sore throat.   Eyes:  Negative for blurred vision.  Respiratory:   Negative for cough, sputum production, shortness of breath and wheezing.   Cardiovascular:  Negative for chest pain and palpitations.  Gastrointestinal:  Negative for heartburn and nausea.  Genitourinary:  Negative for dysuria.  Musculoskeletal:  Negative for falls.  Skin:  Negative for itching and rash.  Neurological:  Negative for dizziness and headaches.  Endo/Heme/Allergies:        See allergy listing  Psychiatric/Behavioral:  Positive for depression (Improved with medication). Negative for hallucinations, substance abuse and suicidal ideas. The patient is nervous/anxious (Stable with medication). The patient does not have insomnia.    Blood pressure 122/63, pulse 79, temperature 98.6 F (37 C), temperature source Oral, resp. rate 17, height 5' 4 (1.626 m), weight 111.6 kg, last menstrual period 01/24/2013, SpO2 97%. Body mass index is 42.23 kg/m.  Mental Status Per Nursing Assessment::   On Admission:  NA  Demographic Factors:  Caucasian, Low socioeconomic status, and Unemployed  Loss Factors: Financial problems/change in socioeconomic status  Historical Factors: NA  Risk Reduction Factors:   Positive social support, Positive therapeutic relationship, and Positive coping skills or problem solving skills  Continued Clinical Symptoms:  Depression:   Recent sense of peace/wellbeing Previous Psychiatric Diagnoses and Treatments Medical Diagnoses and Treatments/Surgeries  Cognitive Features That Contribute To Risk:  Polarized thinking    Suicide Risk:  Mild:  There are no identifiable plans, no associated intent, mild dysphoria and related symptoms, good self-control (both objective and subjective assessment), few other risk factors, and identifiable protective factors, including available and accessible social support.   Follow-up Information     [facility] Go on 01/11/2024.   Why: You have a hospital follow up appointment for therapy and medication  management services on 01/11/24 at 10:00 am .  The appointment will be held in person.  * Please make sure to bring a photo ID with you and your insurance card. Contact information: [address] [place] [address] [phone]                 Plan Of Care/Follow-up recommendations:  Discharge Recommendations:    The patient is being discharged with her family.   Patient is to take her discharge medications as ordered. ?See follow up above.   We recommend that she participates in individual therapy to target uncontrollable agitation and substance abuse.    We recommend that she participates in therapy to target the conflict with her family, to improve communication skills and conflict resolution skills. ?patient is to initiate/implement a contingency based behavioral model to address patient's behavior.   We recommend that she gets AIMS scale, height, weight, blood pressure, fasting lipid panel, fasting blood sugar in three months from discharge if she's on atypical antipsychotics.    Patient will benefit from monitoring of recurrent suicidal ideation since patient is on antidepressant medication.   The patient should abstain from all illicit substances and alcohol.   If the patient's symptoms worsen or do not continue to improve or if the patient becomes actively suicidal or homicidal then it is recommended that the patient return to the closest  hospital emergency room or call 911 for further evaluation and treatment. National Suicide Prevention Lifeline 1800-SUICIDE or [phone].   Please follow up with your primary medical doctor for all other medical needs.    The patient has been educated on the possible side effects to medications and she/her guardian is to contact a medical professional and inform outpatient provider of any new side effects of medication.   She is to take regular diet and activity as tolerated. ?Will benefit from moderate daily exercise.    Patient was educated about removing/locking any firearms, medications or dangerous products from the home.   Activity:  As tolerated   Diet:  Regular Diet   Laurence Pons, FNP 01/07/2024, 12:24 PM

## 2024-01-07 NOTE — Discharge Summary (Signed)
 Physician Discharge Summary Note  Patient:  [name] is an 58 y.o., female MRN:  [id number] DOB:  [birth year] Patient phone:  [phone] (home)  Patient address:   [address] [place] [address],   Total Time spent with patient: 45 minutes  Date of Admission:  12/31/2023 Date of Discharge:   01/07/2024  Reason for Admission:   This is an admission evaluation for this 58 year old Caucasian female with history of schizoaffective disorder and probably substance use disorder. Patient has been admitted/treated for mental health related issues remotely and this [facility] Chart review has indicated that the last time patient was in this hospital was 2015, almost 10 years ago.  At the time, she was treated for schizoaffective disorder.  At the time patient was discharged on BuSpar  10 mg, Tegretol  XR 200 mg, olanzapine  5 mg and trazodone  50 mg.  Patient has no other record of psychiatric hospitalizations since 2015.  However, she is being admitted to this [facility] this time from the [facility] in [place] .  Chart review indicated that patient was a police drop-off with complaint of psychosis as she was feeling like the radioactive trash in her neighborhood may be poisoning her. A review of her current lab results has shown basically normal levels. However, will obtain, lipid panel.  Her toxicology & UDS results were clear.   Principal Problem: Schizoaffective disorder, bipolar type [facility]) Discharge Diagnoses: Principal Problem:   Schizoaffective disorder, bipolar type [facility])  Past Psychiatric History:  This is an admission evaluation for this 58 year old Caucasian female with history of schizoaffective disorder and probably substance use disorder. Patient has been admitted/treated for mental health related issues remotely and this [facility]. Chart review has indicated that the last time patient was in this hospital was 2015, almost 10 years ago.  At the time, she was treated for  schizoaffective disorder.  At the time patient was discharged on BuSpar  10 mg, Tegretol  XR 200 mg, olanzapine  5 mg and trazodone  50 mg.  Patient has no other record of psychiatric hospitalizations since 2015.  However, she is being admitted to this [facility] this time from the [facility] in [place] .  Chart review indicated that patient was a police drop-off with complaint of psychosis as she was feeling like the radioactive trash in her neighborhood may be poisoning her. A review of her current lab results has shown basically normal levels. However, will obtain, lipid panel.  Her toxicology & UDS results were clear.   Past Medical History:  Past Medical History:  Diagnosis Date   Anxiety    B12 deficiency 01/06/2024   Bipolar 1 disorder (HCC)    pt denies, says was misdiagnosed   CHF (congestive heart failure) (HCC)    Chronic back pain    Chronic chest wall pain    Chronic right shoulder pain    Diabetes mellitus    Fibromyalgia    Folate deficiency 01/06/2024   Hypertension    Panic attack    Psychotic disorder (HCC)    Schizoaffective disorder (HCC)    Vitamin D deficiency 01/06/2024    Past Surgical History:  Procedure Laterality Date   CHOLECYSTECTOMY     MANDIBLE SURGERY     Family History:  Family History  Problem Relation Age of Onset   Heart failure Mother    Heart failure Other    Family Psychiatric  History: See H&P Social History:  Social History   Substance and Sexual Activity  Alcohol  Use No     Social History   Substance and Sexual Activity  Drug Use Yes   Types: Marijuana    Social History   Socioeconomic History   Marital status: Legally Separated    Spouse name: Not on file   Number of children: Not on file   Years of education: Not on file   Highest education level: Not on file  Occupational History   Not on file  Tobacco Use   Smoking status: Every Day    Current packs/day: 1.00    Average packs/day: 1 pack/day for  30.0 years (30.0 ttl pk-yrs)    Types: Cigarettes   Smokeless tobacco: Never  Substance and Sexual Activity   Alcohol use: No   Drug use: Yes    Types: Marijuana   Sexual activity: Yes    Birth control/protection: None  Other Topics Concern   Not on file  Social History Narrative   Not on file   Social Drivers of Health   Financial Resource Strain: Not on file  Food Insecurity: No Food Insecurity (12/31/2023)   Hunger Vital Sign    Worried About Running Out of Food in the Last Year: Never true    Ran Out of Food in the Last Year: Never true  Transportation Needs: No Transportation Needs (12/31/2023)   PRAPARE - Administrator, Civil Service (Medical): No    Lack of Transportation (Non-Medical): No  Physical Activity: Not on file  Stress: Not on file  Social Connections: Not on file   Hospital Course:  During the patient's hospitalization, patient had extensive initial psychiatric evaluation, and follow-up psychiatric evaluations every day.  Psychiatric diagnoses provided upon initial assessment:  Schizoaffective disorder, bipolar type (HCC)   Patient's psychiatric medications were adjusted on admission:  -Initiated Zyprexa  5 mg po Q hs for psychosis. -Continue Melatonin 10 mg po Q hs.   During the hospitalization, other adjustments were made to the patient's psychiatric medication regimen:  -Zyprexa  was discontinued for psychosis -Latuda 40 mg p.o. daily was initiated with therapeutic effectiveness  Patient's care was discussed during the interdisciplinary team meeting every day during the hospitalization.  The patient denies having side effects to prescribed psychiatric medication.  Gradually, patient started adjusting to milieu. The patient was evaluated each day by a clinical provider to ascertain response to treatment. Improvement was noted by the patient's report of decreasing symptoms, improved sleep and appetite, affect, medication tolerance, behavior, and  participation in unit programming.  Patient was asked each day to complete a self inventory noting mood, mental status, pain, new symptoms, anxiety and concerns.    Symptoms were reported as significantly decreased or resolved completely by discharge.   On day of discharge, the patient reports that their mood is stable. The patient denied having suicidal thoughts for more than 48 hours prior to discharge.  Patient denies having homicidal thoughts.  Patient denies having auditory hallucinations.  Patient denies any visual hallucinations or other symptoms of psychosis. The patient was motivated to continue taking medication with a goal of continued improvement in mental health.   The patient reports their target psychiatric symptoms of psychosis responded well to the psychiatric medications, and the patient reports overall benefit other psychiatric hospitalization. Supportive psychotherapy was provided to the patient. The patient also participated in regular group therapy while hospitalized. Coping skills, problem solving as well as relaxation therapies were also part of the unit programming.  Labs were reviewed with the patient, and abnormal results were  discussed with the patient.  The patient is able to verbalize their individual safety plan to this provider.  # It is recommended to the patient to continue psychiatric medications as prescribed, after discharge from the hospital.    # It is recommended to the patient to follow up with your outpatient psychiatric provider and PCP.  # It was discussed with the patient, the impact of alcohol, drugs, tobacco have been there overall psychiatric and medical wellbeing, and total abstinence from substance use was recommended the patient.ed.  # Prescriptions provided or sent directly to preferred pharmacy at discharge. Patient agreeable to plan. Given opportunity to ask questions. Appears to feel comfortable with discharge.    # In the event of worsening  symptoms, the patient is instructed to call the crisis hotline, 911 and or go to the nearest ED for appropriate evaluation and treatment of symptoms. To follow-up with primary care provider for other medical issues, concerns and or health care needs  # Patient was discharged to home with a plan to follow up as noted below.   Physical Findings: AIMS:  , ,  ,  ,  ,  ,   CIWA:    COWS:     Musculoskeletal: Strength & Muscle Tone: within normal limits Gait & Station: normal Patient leans: N/A  Psychiatric Specialty Exam:  Presentation  General Appearance:  Appropriate for Environment; Casual; Fairly Groomed  Eye Contact: Good  Speech: Clear and Coherent  Speech Volume: Normal  Handedness: Right  Mood and Affect  Mood: Euthymic  Affect: Congruent  Thought Process  Thought Processes: Coherent  Descriptions of Associations:Intact  Orientation:Full (Time, Place and Person)  Thought Content:Logical  History of Schizophrenia/Schizoaffective disorder:Yes  Duration of Psychotic Symptoms:Greater than six months  Hallucinations:Hallucinations: None  Ideas of Reference:None  Suicidal Thoughts:Suicidal Thoughts: No  Homicidal Thoughts:Homicidal Thoughts: No  Sensorium  Memory: Immediate Good; Recent Good  Judgment: Fair  Insight: Fair  Art therapist  Concentration: Good  Attention Span: Good  Recall: Fair  Fund of Knowledge: Fair  Language: Good  Psychomotor Activity  Psychomotor Activity: Psychomotor Activity: Normal  Assets  Assets: Communication Skills; Desire for Improvement; Physical Health; Resilience  Sleep  Sleep: Sleep: Good  Estimated Sleeping Duration (Last 24 Hours): 3.25 hours  Physical Exam: Physical Exam Vitals and nursing note reviewed.  Constitutional:      General: She is not in acute distress.    Appearance: She is normal weight. She is not ill-appearing.  HENT:     Head: Normocephalic.     Right  Ear: External ear normal.     Left Ear: External ear normal.     Nose: Nose normal.     Mouth/Throat:     Mouth: Mucous membranes are moist.     Pharynx: Oropharynx is clear.   Eyes:     Extraocular Movements: Extraocular movements intact.    Cardiovascular:     Rate and Rhythm: Normal rate.     Pulses: Normal pulses.  Pulmonary:     Effort: Pulmonary effort is normal.  Abdominal:     Comments: Deferred  Genitourinary:    Comments: Deferred  Musculoskeletal:     Cervical back: Normal range of motion.   Skin:    General: Skin is warm.   Neurological:     General: No focal deficit present.     Mental Status: She is alert and oriented to person, place, and time.   Psychiatric:        Mood and  Affect: Mood normal.        Behavior: Behavior normal.        Thought Content: Thought content normal.    Review of Systems  Constitutional:  Negative for chills and fever.  HENT:  Negative for sore throat.   Eyes:  Negative for blurred vision and double vision.  Respiratory:  Negative for cough, sputum production, shortness of breath and wheezing.   Cardiovascular:  Negative for chest pain and palpitations.  Gastrointestinal:  Negative for heartburn and nausea.  Genitourinary:  Negative for dysuria and urgency.  Musculoskeletal:  Negative for falls.  Skin:  Negative for itching and rash.  Neurological:  Negative for dizziness, tingling, tremors and headaches.  Endo/Heme/Allergies:        See allergy listing  Psychiatric/Behavioral:  Negative for depression, hallucinations, substance abuse and suicidal ideas. The patient is not nervous/anxious.    Blood pressure 122/63, pulse 79, temperature 98.6 F (37 C), temperature source Oral, resp. rate 17, height 5' 4 (1.626 m), weight 111.6 kg, last menstrual period 01/24/2013, SpO2 97%. Body mass index is 42.23 kg/m.   Social History   Tobacco Use  Smoking Status Every Day   Current packs/day: 1.00   Average packs/day: 1  pack/day for 30.0 years (30.0 ttl pk-yrs)   Types: Cigarettes  Smokeless Tobacco Never   Tobacco Cessation:  A prescription for an FDA-approved tobacco cessation medication was offered at discharge and the patient refused  Blood Alcohol level:  Lab Results  Component Value Date   [facility] <15 12/30/2023   ETH <5 08/07/2015    Metabolic Disorder Labs:  Lab Results  Component Value Date   HGBA1C 4.8 12/30/2023   MPG 91.06 12/30/2023   MPG 301 08/26/2014   No results found for: PROLACTIN Lab Results  Component Value Date   CHOL 139 01/05/2024   TRIG 103 01/05/2024   HDL 42 01/05/2024   CHOLHDL 3.3 01/05/2024   VLDL 21 01/05/2024   LDLCALC 76 01/05/2024   See Psychiatric Specialty Exam and Suicide Risk Assessment completed by Attending Physician prior to discharge.  Discharge destination:  Home  Is patient on multiple antipsychotic therapies at discharge:  No   Has Patient had three or more failed trials of antipsychotic monotherapy by history:  No  Recommended Plan for Multiple Antipsychotic Therapies: NA  Discharge Instructions     Increase activity slowly   Complete by: As directed       Allergies as of 01/07/2024       Reactions   Cocaine Anaphylaxis, Other (See Comments)   Eyes Swell Shut   Procaine Hcl Anaphylaxis   Eyes swell shut   Aripiprazole Other (See Comments)   Gives me Knots in legs   Cogentin  [benztropine  Mesylate] Other (See Comments)   Knots in legs   Geodon  [ziprasidone  Hcl] Other (See Comments)   Knots in Legs   Haldol  [haloperidol  Decanoate] Nausea And Vomiting   Leg stiffness and Knots in legs   Seroquel [quetiapine Fumarate] Hives        Medication List     TAKE these medications      Indication  cyanocobalamin 1000 MCG tablet Take 1 tablet (1,000 mcg total) by mouth daily. Start taking on: January 08, 2024  Indication: Inadequate Vitamin B12   folic acid 1 MG tablet Commonly known as: FOLVITE Take 1 tablet (1 mg total) by  mouth daily. Start taking on: January 08, 2024  Indication: Anemia From Inadequate Folic Acid   insulin  aspart 100  UNIT/ML injection Commonly known as: novoLOG  Inject 0-5 Units into the skin at bedtime.  Indication: Type 2 Diabetes   insulin  aspart 100 UNIT/ML injection Commonly known as: novoLOG  Inject 0-9 Units into the skin 3 (three) times daily with meals.  Indication: Type 2 Diabetes   lurasidone 40 MG Tabs tablet Commonly known as: LATUDA Take 1 tablet (40 mg total) by mouth at bedtime.  Indication: MIXED BIPOLAR AFFECTIVE DISORDER   Melatonin 10 MG Tabs Take 10 mg by mouth at bedtime.  Indication: Trouble Sleeping   Vitamin D (Ergocalciferol) 1.25 MG (50000 UNIT) Caps capsule Commonly known as: DRISDOL Take 1 capsule (50,000 Units total) by mouth daily. Start taking on: January 08, 2024  Indication: Vitamin D Deficiency        Follow-up Information     [facility] Go on 01/11/2024.   Why: You have a hospital follow up appointment for therapy and medication management services on 01/11/24 at 10:00 am .  The appointment will be held in person.  * Please make sure to bring a photo ID with you and your insurance card. Contact information: [address] [place] [address] [phone]                 Follow-up recommendations:   Discharge Recommendations:    The patient is being discharged with her family.   Patient is to take her discharge medications as ordered. ?See follow up above.   We recommend that she participates in individual therapy to target uncontrollable agitation and substance abuse.    We recommend that she participates in therapy to target the conflict with her family, to improve communication skills and conflict resolution skills. ?patient is to initiate/implement a contingency based behavioral model to address patient's behavior.   We recommend that she gets AIMS scale, height, weight, blood pressure, fasting  lipid panel, fasting blood sugar in three months from discharge if she's on atypical antipsychotics.    Patient will benefit from monitoring of recurrent suicidal ideation since patient is on antidepressant medication.   The patient should abstain from all illicit substances and alcohol.   If the patient's symptoms worsen or do not continue to improve or if the patient becomes actively suicidal or homicidal then it is recommended that the patient return to the closest hospital emergency room or call 911 for further evaluation and treatment. National Suicide Prevention Lifeline 1800-SUICIDE or (306)059-9023.   Please follow up with your primary medical doctor for all other medical needs.    The patient has been educated on the possible side effects to medications and she/her guardian is to contact a medical professional and inform outpatient provider of any new side effects of medication.   She is to take regular diet and activity as tolerated. ?Will benefit from moderate daily exercise.   Patient was educated about removing/locking any firearms, medications or dangerous products from the home.    Activity:  As tolerated   Diet:  Regular Diet   Signed:  Laurence Pons, FNP 01/07/2024, 5:31 PM

## 2024-01-07 NOTE — Progress Notes (Addendum)
  [facility] Adult Case Management Discharge Plan :  Will you be returning to the same living situation after discharge:  Yes,  patient will return home At discharge, do you have transportation home?: Yes,  patient's husband, Raizy Auzenne, [phone], will pick her up at 12:30 PM Do you have the ability to pay for your medications: Yes,  patient has insurance  Release of information consent forms completed and in the chart;  Patient's signature needed at discharge.  Patient to Follow up at:  Follow-up Information     [facility] Go on 01/11/2024.   Why: You have a hospital follow up appointment for therapy and medication management services on 01/11/24 at 10:00 am .  The appointment will be held in person.  * Please make sure to bring a photo ID with you and your insurance card. Contact information: [address] [place] [address] [phone]                 Next level of care provider has access to [facility] Link:no  Safety Planning and Suicide Prevention discussed: Yes,  Naara Kelty (husband) [phone]  Has patient been referred to the Quitline?: Patient refused referral for treatment.  Patient admitted to smoking, but doesn't want to stop at this time.  Patient has been referred for addiction treatment:  Patient tested negative for all substances.  Patient said that she stopped using marijuana several months ago.   [place] Zalyn Amend, LCSWA 01/07/2024, 9:12 AM
